# Patient Record
Sex: Female | Born: 1965
Health system: Southern US, Community
[De-identification: ages and names within clinical notes are randomized; demographics above are authoritative.]

## PROBLEM LIST (undated history)

## (undated) ENCOUNTER — Ambulatory Visit: Payer: PPO | Source: Home / Self Care

## (undated) DIAGNOSIS — R413 Other amnesia: Secondary | ICD-10-CM

## (undated) DIAGNOSIS — G2579 Other drug induced movement disorders: Secondary | ICD-10-CM

## (undated) DIAGNOSIS — K589 Irritable bowel syndrome without diarrhea: Secondary | ICD-10-CM

## (undated) DIAGNOSIS — G9081 Serotonin syndrome: Secondary | ICD-10-CM

## (undated) DIAGNOSIS — E785 Hyperlipidemia, unspecified: Secondary | ICD-10-CM

## (undated) DIAGNOSIS — M797 Fibromyalgia: Secondary | ICD-10-CM

## (undated) DIAGNOSIS — G43909 Migraine, unspecified, not intractable, without status migrainosus: Secondary | ICD-10-CM

## (undated) DIAGNOSIS — M47816 Spondylosis without myelopathy or radiculopathy, lumbar region: Secondary | ICD-10-CM

## (undated) DIAGNOSIS — J189 Pneumonia, unspecified organism: Secondary | ICD-10-CM

## (undated) DIAGNOSIS — G8929 Other chronic pain: Secondary | ICD-10-CM

## (undated) DIAGNOSIS — K219 Gastro-esophageal reflux disease without esophagitis: Secondary | ICD-10-CM

## (undated) DIAGNOSIS — M549 Dorsalgia, unspecified: Secondary | ICD-10-CM

## (undated) DIAGNOSIS — K449 Diaphragmatic hernia without obstruction or gangrene: Secondary | ICD-10-CM

## (undated) DIAGNOSIS — M542 Cervicalgia: Secondary | ICD-10-CM

## (undated) DIAGNOSIS — Z87442 Personal history of urinary calculi: Secondary | ICD-10-CM

## (undated) DIAGNOSIS — G250 Essential tremor: Secondary | ICD-10-CM

## (undated) DIAGNOSIS — IMO0002 Reserved for concepts with insufficient information to code with codable children: Secondary | ICD-10-CM

## (undated) DIAGNOSIS — R251 Tremor, unspecified: Secondary | ICD-10-CM

## (undated) DIAGNOSIS — N200 Calculus of kidney: Secondary | ICD-10-CM

## (undated) DIAGNOSIS — F32A Depression, unspecified: Secondary | ICD-10-CM

## (undated) DIAGNOSIS — F319 Bipolar disorder, unspecified: Secondary | ICD-10-CM

## (undated) HISTORY — DX: Irritable bowel syndrome, unspecified: K58.9

## (undated) HISTORY — DX: Gastro-esophageal reflux disease without esophagitis: K21.9

## (undated) HISTORY — DX: Calculus of kidney: N20.0

## (undated) HISTORY — DX: Serotonin syndrome: G90.81

## (undated) HISTORY — DX: Migraine, unspecified, not intractable, without status migrainosus: G43.909

## (undated) HISTORY — DX: Bipolar disorder, unspecified: F31.9

## (undated) HISTORY — PX: APPENDECTOMY: SHX54

## (undated) HISTORY — PX: BACK SURGERY: SHX140

## (undated) HISTORY — DX: Spondylosis without myelopathy or radiculopathy, lumbar region: M47.816

## (undated) HISTORY — DX: Other drug induced movement disorders: G25.79

## (undated) HISTORY — PX: LAPAROSCOPIC CHOLECYSTECTOMY: SUR755

## (undated) HISTORY — PX: CHOLECYSTECTOMY: SHX55

## (undated) HISTORY — PX: LASER ABLATION OF THE CERVIX: SHX1949

## (undated) HISTORY — DX: Diaphragmatic hernia without obstruction or gangrene: K44.9

## (undated) HISTORY — PX: TONSILLECTOMY: SUR1361

## (undated) HISTORY — DX: Hyperlipidemia, unspecified: E78.5

## (undated) HISTORY — DX: Essential tremor: G25.0

## (undated) HISTORY — DX: Fibromyalgia: M79.7

## (undated) HISTORY — PX: BRONCHOSCOPY: SUR163

## (undated) HISTORY — DX: Other amnesia: R41.3

## (undated) HISTORY — DX: Tremor, unspecified: R25.1

## (undated) HISTORY — DX: Pneumonia, unspecified organism: J18.9

## (undated) HISTORY — DX: Reserved for concepts with insufficient information to code with codable children: IMO0002

## (undated) SURGERY — ARTHROSCOPY, KNEE, WITH MEDIAL MENISCECTOMY
Anesthesia: Choice | Site: Knee | Laterality: Left

---
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
## 2000-05-05 ENCOUNTER — Ambulatory Visit (HOSPITAL_COMMUNITY): Admission: RE | Admit: 2000-05-05 | Discharge: 2000-05-05 | Payer: Self-pay | Admitting: Neurosurgery

## 2000-05-05 ENCOUNTER — Encounter: Payer: Self-pay | Admitting: Neurosurgery

## 2000-08-13 ENCOUNTER — Encounter: Payer: Self-pay | Admitting: Neurosurgery

## 2000-08-13 ENCOUNTER — Ambulatory Visit (HOSPITAL_COMMUNITY): Admission: RE | Admit: 2000-08-13 | Discharge: 2000-08-14 | Payer: Self-pay | Admitting: Neurosurgery

## 2000-08-13 HISTORY — PX: OTHER SURGICAL HISTORY: SHX169

## 2000-09-23 ENCOUNTER — Ambulatory Visit (HOSPITAL_COMMUNITY): Admission: RE | Admit: 2000-09-23 | Discharge: 2000-09-23 | Payer: Self-pay | Admitting: Neurosurgery

## 2000-09-23 ENCOUNTER — Encounter: Payer: Self-pay | Admitting: Neurosurgery

## 2003-01-09 ENCOUNTER — Encounter: Admission: RE | Admit: 2003-01-09 | Discharge: 2003-01-09 | Payer: Self-pay | Admitting: Orthopaedic Surgery

## 2003-10-23 ENCOUNTER — Ambulatory Visit (HOSPITAL_COMMUNITY): Admission: RE | Admit: 2003-10-23 | Discharge: 2003-10-23 | Payer: Self-pay | Admitting: Rheumatology

## 2004-06-09 HISTORY — PX: ANKLE FRACTURE SURGERY: SHX122

## 2004-07-13 ENCOUNTER — Emergency Department (HOSPITAL_COMMUNITY): Admission: EM | Admit: 2004-07-13 | Discharge: 2004-07-13 | Payer: Self-pay | Admitting: Emergency Medicine

## 2004-07-31 ENCOUNTER — Ambulatory Visit: Payer: Self-pay | Admitting: Internal Medicine

## 2004-08-06 ENCOUNTER — Ambulatory Visit: Payer: Self-pay | Admitting: Internal Medicine

## 2004-08-06 ENCOUNTER — Ambulatory Visit (HOSPITAL_COMMUNITY): Admission: RE | Admit: 2004-08-06 | Discharge: 2004-08-06 | Payer: Self-pay | Admitting: Internal Medicine

## 2004-08-06 HISTORY — PX: ESOPHAGEAL DILATION: SHX303

## 2004-09-24 ENCOUNTER — Ambulatory Visit: Payer: Self-pay | Admitting: Internal Medicine

## 2004-11-06 ENCOUNTER — Ambulatory Visit (HOSPITAL_COMMUNITY): Admission: RE | Admit: 2004-11-06 | Discharge: 2004-11-06 | Payer: Self-pay | Admitting: Internal Medicine

## 2004-11-06 ENCOUNTER — Ambulatory Visit: Payer: Self-pay | Admitting: Internal Medicine

## 2006-10-18 ENCOUNTER — Ambulatory Visit: Payer: Self-pay | Admitting: Cardiology

## 2006-11-24 ENCOUNTER — Ambulatory Visit: Payer: Self-pay | Admitting: Cardiology

## 2006-11-27 ENCOUNTER — Ambulatory Visit: Payer: Self-pay | Admitting: Pulmonary Disease

## 2006-11-27 ENCOUNTER — Inpatient Hospital Stay (HOSPITAL_COMMUNITY): Admission: AD | Admit: 2006-11-27 | Discharge: 2006-11-28 | Payer: Self-pay | Admitting: Cardiology

## 2006-11-27 ENCOUNTER — Ambulatory Visit: Payer: Self-pay | Admitting: Cardiology

## 2006-11-27 HISTORY — PX: CARDIAC CATHETERIZATION: SHX172

## 2006-11-28 ENCOUNTER — Encounter (INDEPENDENT_AMBULATORY_CARE_PROVIDER_SITE_OTHER): Payer: Self-pay | Admitting: Cardiology

## 2006-11-28 ENCOUNTER — Ambulatory Visit: Payer: Self-pay | Admitting: Internal Medicine

## 2006-12-24 ENCOUNTER — Ambulatory Visit: Payer: Self-pay | Admitting: Physician Assistant

## 2007-01-25 ENCOUNTER — Encounter: Admission: RE | Admit: 2007-01-25 | Discharge: 2007-01-25 | Payer: Self-pay | Admitting: Internal Medicine

## 2007-02-09 ENCOUNTER — Encounter: Admission: RE | Admit: 2007-02-09 | Discharge: 2007-02-09 | Payer: Self-pay | Admitting: Internal Medicine

## 2007-02-24 ENCOUNTER — Encounter: Admission: RE | Admit: 2007-02-24 | Discharge: 2007-02-24 | Payer: Self-pay | Admitting: Internal Medicine

## 2008-02-04 ENCOUNTER — Encounter: Admission: RE | Admit: 2008-02-04 | Discharge: 2008-02-04 | Payer: Self-pay | Admitting: Internal Medicine

## 2008-02-17 ENCOUNTER — Encounter: Admission: RE | Admit: 2008-02-17 | Discharge: 2008-02-17 | Payer: Self-pay | Admitting: Internal Medicine

## 2008-03-02 ENCOUNTER — Encounter: Admission: RE | Admit: 2008-03-02 | Discharge: 2008-03-02 | Payer: Self-pay | Admitting: Internal Medicine

## 2008-09-04 ENCOUNTER — Encounter: Admission: RE | Admit: 2008-09-04 | Discharge: 2008-09-04 | Payer: Self-pay | Admitting: Internal Medicine

## 2009-02-19 ENCOUNTER — Encounter: Admission: RE | Admit: 2009-02-19 | Discharge: 2009-02-19 | Payer: Self-pay | Admitting: Orthopaedic Surgery

## 2009-08-08 ENCOUNTER — Ambulatory Visit (HOSPITAL_COMMUNITY)
Admission: RE | Admit: 2009-08-08 | Discharge: 2009-08-08 | Payer: Self-pay | Source: Home / Self Care | Admitting: Allergy and Immunology

## 2010-06-29 ENCOUNTER — Emergency Department (HOSPITAL_COMMUNITY)
Admission: EM | Admit: 2010-06-29 | Discharge: 2010-06-29 | Payer: Self-pay | Source: Home / Self Care | Admitting: Emergency Medicine

## 2010-07-02 LAB — COMPREHENSIVE METABOLIC PANEL
Alkaline Phosphatase: 68 U/L (ref 39–117)
BUN: 14 mg/dL (ref 6–23)
CO2: 24 mEq/L (ref 19–32)
Chloride: 102 mEq/L (ref 96–112)
Creatinine, Ser: 0.78 mg/dL (ref 0.4–1.2)
GFR calc non Af Amer: >60 mL/min (ref >60)
Glucose, Bld: 119 mg/dL — ABNORMAL HIGH (ref 70–99)
Potassium: 3.5 mEq/L (ref 3.5–5.1)
Total Bilirubin: 0.4 mg/dL (ref 0.3–1.2)

## 2010-07-02 LAB — URINALYSIS, ROUTINE W REFLEX MICROSCOPIC
Hgb urine dipstick: NEGATIVE
Protein, ur: NEGATIVE mg/dL
Specific Gravity, Urine: 1.019 (ref 1.005–1.030)
Urine Glucose, Fasting: NEGATIVE mg/dL
Urobilinogen, UA: 0.2 mg/dL (ref 0.0–1.0)

## 2010-07-02 LAB — CBC
HCT: 41 % (ref 36.0–46.0)
Hemoglobin: 13.9 g/dL (ref 12.0–15.0)
MCH: 30.7 pg (ref 26.0–34.0)
MCV: 90.5 fL (ref 78.0–100.0)
RBC: 4.53 MIL/uL (ref 3.87–5.11)

## 2010-07-02 LAB — DIFFERENTIAL
Eosinophils Absolute: 0.2 10*3/uL (ref 0.0–0.7)
Lymphocytes Relative: 10 % — ABNORMAL LOW (ref 12–46)
Lymphs Abs: 0.8 10*3/uL (ref 0.7–4.0)
Monocytes Relative: 10 % (ref 3–12)
Neutro Abs: 5.7 10*3/uL (ref 1.7–7.7)
Neutrophils Relative %: 77 % (ref 43–77)

## 2010-07-02 LAB — RAPID URINE DRUG SCREEN, HOSP PERFORMED: Barbiturates: POSITIVE — AB

## 2010-07-02 LAB — D-DIMER, QUANTITATIVE: D-Dimer, Quant: <0.22 ug/mL-FEU (ref 0.00–0.48)

## 2010-07-02 LAB — URINE MICROSCOPIC-ADD ON

## 2010-10-22 NOTE — Discharge Summary (Signed)
NAMEWINSTON, SOBCZYK                  ACCOUNT NO.:  [id number]   MEDICAL RECORD NO.:  [id number]          PATIENT TYPE:  INP   LOCATION:  4713                         FACILITY:  [facility]   PHYSICIAN:  Bevelyn Buckles. Bensimhon, MDDATE OF BIRTH:  [birth year]   DATE OF ADMISSION:  11/27/2006  DATE OF DISCHARGE:  11/28/2006                               DISCHARGE SUMMARY   PROCEDURES:  1. 2-D echocardiogram.  2. Cardiac catheterization.  3. Left ventriculogram.  4. Coronary angiogram.   PRIMARY DIAGNOSES:  Chest pain, minimal coronary artery disease with  normal left ventricular function by catheterization;  noncardiac  etiology for pain.   SECONDARY DIAGNOSES:  1. History of asthma with chronic dyspnea.  2. Chronic headache.  3. Fibromyalgia.  4. Hypoxemia, with no evidence of pulmonary embolus or congestive      heart failure.  5. Sinus tachycardia.  6. Leukocytosis, with a history of Staphylococcus seen at bronchoscopy      and on 1 month of antibiotics.  7. History of back surgery.  8. EGD with dilatation.  9. Depression.  10.Gastroesophageal reflux disease.  11.Polyneuropathy.  12.Possible sleep apnea.  13.Irritable that bowel syndrome.  14.History of degenerative joint disease.  15.History of surgical dysplasia with laser treatment x2.  16.Status post laparoscopic cholecystectomy.  17.Status post tonsillectomy.   TIME AT DISCHARGE:  40 minutes.   HOSPITAL COURSE:  Ms. Shon Baton is a 45 year old female with no previous  history of coronary artery disease.  She was seen in May by Dr. Andee Lineman,  who felt that she had atypical chest pain and felt that an outpatient  dobutamine echocardiogram was appropriate.  This was negative for  ischemia and normal in all values.  She went to Dr. Betsey Amen office for  headache and was referred to [facility].  She complained of chest  pain and was evaluated there by cardiology.  To definitively assess her  for coronary artery disease,  She was  transferred to [facility]  for catheterization.   The cardiac catheterization showed a 20% LAD.  Her EF was 65% without  regional wall motion abnormalities and no AS or MR.  Dr. Samule Ohm  evaluated the films and felt that she had minimal coronary artery  disease with a normal left ventricle, and no cardiac explanation for her  chest pain.   The patient, her mother and sister were concerned for pulmonary  hypertension.  A 2-D echocardiogram was performed, and per Dr.  Prescott Gum report no significant pulmonary hypertension was seen.  A  pulmonary consult was called and she was seen by Dr. Craige Cotta.  Dr. Craige Cotta  felt that her dyspnea was multifactorial.  She has a history of asthma  but he did not think that this was a prominent component.  He felt that  she also had components of obesity, deconditioning and probable sleep  apnea.  He felt that she would need to have a CPX study as an  outpatient, and also she might need a repeat polysomnogram.  He felt  that further workup could be done as an outpatient.  Of  note, she has  seen a pulmonologist at [facility] and has a follow-up appointment  already scheduled with that physician.  The situation was discussed with  the patient and her mother, who stated they would decide if they would  with wish further evaluation by the physician in [facility] or with Dr.  Craige Cotta.  They will contact Dr. Craige Cotta if they wish followup with him.  She  was encouraged to keep the follow-up appointment with her physician in  [place].  Ms. Shon Baton was evaluated by Dr. Gala Romney and by Dr. Craige Cotta,  and considered stable for discharge on November 28, 2006.   DISCHARGE INSTRUCTIONS:  1. Her activity level is to be increased gradually.  2. She is to call our office for any problems with the catheterization      site.  3. She is to follow up with Dr. Andee Lineman, and a message has been left.  4. She is to follow up with her pulmonologist at [facility] in [place] and with Dr. Benson Setting and Dr. Abel Presto      as needed.  5. She is to follow up with Dr. Craige Cotta as needed.   DISCHARGE MEDICATIONS:  1. Coated aspirin 81 mg daily.  2. Cymbicort 2 puffs b.i.d.  3. Klonopin 2 mg b.i.d.  4. Wellbutrin XL 300 mg daily.  5. Zoloft 200 mg daily.  6. Singulair 10 mg daily.  7. Protonix 40 mg daily.  8. Lamictal 150 mg daily.  9. Mysoline 50 mg daily.  10.Zithromax 250 mg a day for a month.  11.Ambien as prior to admission.  12.Birth control pills as prior to admission.      Theodore Demark, PA-C      Bevelyn Buckles. Bensimhon, MD  Electronically Signed    RB/MEDQ  D:  11/28/2006  T:  11/29/2006  Job:  [id number]   cc:   Sabino Donovan [facility],  Coralyn Helling, MD  [facility], [place], [place] Female Pulmonologist (name starts w/N)

## 2010-10-22 NOTE — Cardiovascular Report (Signed)
Nancy [name], LEVERETT NO.:  [id number]   MEDICAL RECORD NO.:  [id number]          PATIENT TYPE:  INP   LOCATION:  4713                         FACILITY:  [facility]   PHYSICIAN:  Salvadore Farber, MD  DATE OF BIRTH:  [birth year]   DATE OF PROCEDURE:  11/27/2006  DATE OF DISCHARGE:                            CARDIAC CATHETERIZATION   PROCEDURE:  1. Left heart catheterization.  2. Left ventriculography.  3. Coronary angiography.   INDICATIONS:  Ms. Shon Baton is a 45 year old woman with fibromyalgia,  asthma and GERD, who was admitted to [facility] on June 17 by her  neurologist with severe headache.  During hospitalization, she has had  persistent severe chest discomforts.  She ruled out for myocardial  infarction.  Dobutamine echocardiogram showed no evidence of ischemia or  infarction.  The patient nonetheless insisted on cardiac catheterization  because of concerns with a family history of premature atherosclerotic  disease; she was therefore transferred for this procedure.  She  continues to complain of severe headache, despite treatment with  narcotics.   PROCEDURAL TECHNIQUE:  Informed consent was obtained.  Under 1%  lidocaine local anesthesia, a 5-French sheath was placed in the right  common femoral artery using the modified Seldinger technique.  Diagnostic angiography and ventriculography were performed using JL-4,  JR-4, and pigtail catheters.  The patient tolerated the procedure well  and was transferred to the holding room in stable condition.  Sheaths  will be removed there.   COMPLICATIONS:  None.   FINDINGS:  1. LV:  128/3/9.  EF 65% without regional wall motion abnormality.  2. No aortic stenosis or mitral regurgitation.  3. Left main:  Angiographically normal.  4. LAD:  Moderate-sized vessel giving rise to a single large diagonal.      The LAD has a 20% stenosis just after the origin of the diagonal.  5. Circumflex:  Fairly large vessel  giving rise to a single marginal.      It is angiographically normal.  6. RCA:  Moderate-sized dominant vessel.  It is angiographically      normal.   IMPRESSION/PLAN:  The patient has minimal atherosclerotic coronary  disease with normal left ventricular size and systolic function.  There  is no aortic stenosis or mitral regurgitation.  I see no cardiac  explanation for her chest pains.      Salvadore Farber, MD  Electronically Signed     WED/MEDQ  D:  11/27/2006  T:  11/28/2006  Job:  [id number]   cc:   Doreen Beam

## 2010-10-22 NOTE — Assessment & Plan Note (Signed)
[facility]                          [facility] OFF[facility] NOTE   SHARIAN, DELIA                         MRN:          [id number]  DATE:12/24/2006                            DOB:          [birth year]    CARDIOLOGIST:  Dr. Lewayne Bunting.   PRIMARY CARE PHYSICIAN:  Dr. Doreen Beam.   REASON FOR VISIT:  Post hospitalization followup.   HISTORY OF PRESENT ILLNESS:  Ms. Shon Baton is a 45 year old female patient  who presents to the office today for post hospitalization followup.  She  had recently been evaluated with dobutamine stress test that was  nonischemic.  However, she was admitted to [facility] with  intractable pain from headache.  She has multiple medical problems  including fibromyalgia and chronic back pain.  She was further evaluated  and it was felt that she should be worked up with cardiac  catheterization.  She was transferred to [facility] and cardiac  catheterization showed minimal nonobstructive coronary disease.  She had  a 20% lesion in the LAD and otherwise normal coronary arteries.  Her EF  was 65%.  An echocardiogram was also done that showed no evidence of  pulmonary hypertension.  She also had a chest CT done that showed no  evidence of pulmonary embolus.  Pulmonology saw the patient and felt  that she may need CPX testing as an outpatient as well as followup  polysomnogram.  The patient has a pulmonologist in [facility].  Since  discharge from the hospital she has been back to see Dr. Michaelle Copas.  She  notes that she has been diagnosed with bronchiolitis.  She is on chronic  Zithromax.  She goes back to see Dr. Michaelle Copas in several weeks.  She is  also trying to get in to see a Neurologist in [facility] secondary to  history of tremors and headaches, as well as balance problems.  She  continues to have chest discomfort and shortness of breath.  There has  been no change in her symptoms since discharge from the  hospital.  She  notes that her breathing overall has improved since being on Zithromax.  She tells me that at this point in time Dr. Michaelle Copas does not plan on  doing any followup CPX testing or repeat sleep study.  From what she  tells me it sounds like the plan is to see how she responds to continued  therapy for bronchiolitis before persuing any other testing.   CURRENT MEDICATIONS:  1. Wellbutrin XL 300 mg daily.  2. Zoloft 200 mg daily.  3. Symbicort 160/45 two puff b.i.d.  4. Verapamil 120 mg daily.  5. Aspirin 81 mg daily.  6. Singulair 10 mg daily.  7. Protonix 40 mg daily.  8. Lamictal 150 mg daily.  9. Mysoline 50 mg daily.  10.Lo Ovral.  11.Ambien.  12.Clonazepam.  13.Zithromax 250 mg daily.  14.Albuterol p.r.n.  15.Flexeril p.r.n.  16.Hydrocodone  p.r.n.  17.Phenergan p.r.n.   ALLERGIES:  CYMBALTA, EFFEXOR, KEFLEX, HYDROCODONE (?), MORPHINE (SHE  MUST HAVE PHENERGAN WITH MORPHINE), THEODUR .  PHYSICAL EXAMINATION:  She is well-nourished, well-developed female in  obvious pain.  Blood pressure 129/89, pulse 111, weight 181 pounds.  HEENT:  Normal.  CARDIAC:  Normal S1-S2, regular rate and rhythm.  LUNGS:  Clear to auscultation bilaterally.  ABDOMEN:  Soft, nontender.  EXTREMITIES:  Without edema.  Right femoral arteriotomy site without  hematoma or bruit.   Electrocardiogram reveals sinus tachycardia with a heart rate of 104, no  acute changes.   IMPRESSION:  1. Chronic chest pain -- non cardiac.  2. Chronic dyspnea -- multifactorial.  3. History of asthma.  4. Recent history of bronchiolitis.      a.     Apparent finding of Staphylococcus at bronchoscopy recently.  5. Chronic headaches.  6. Fibromyalgia.  7. Chronic back pain.      a.     Status post surgery.  8. Gastroesophageal reflux disease.      a.     History of dilatation.  9. Depression.  10.Polyneuropathy.  11.Questionable sleep apnea.  12.Irritable bowel syndrome.  13.Good left  ventricular function.  14.Minimal coronary plaquing by recent cardiac catheterization.   PLAN:  Patient presents to the office today for post catheterization  followup.  As noted above she continues to have chest pain and shortness  of breath.  Her cardiac workup was fairly normal.  At this point in time  she does not require any further cardiovascular testing.  She can follow  up in our office on a p.r.n. basis.  She has followup arranged with the  Pulmonologist in [place].  Further recommendations regarding  treatment of her dyspnea will be per Dr. Michaelle Copas.  She will continue  on an aspirin a day.  She should continue followup for risk factor  modification with Dr. Sherril Croon.  Since she does have 20% plaquing in the LAD  she would certainly  benefit from statin therapy, however, in light of her multiple medical  problems and multiple medications I will leave that up to Dr. Sherril Croon to  address.      Tereso Newcomer, PA-C  Electronically Signed      Learta Codding, MD,FACC  Electronically Signed   SW/MedQ  DD: 12/24/2006  DT: 12/24/2006  Job #: [id number]   cc:   Doreen Beam  Dr. Michaelle Copas, at [facility]  Felipe Drone, MD

## 2010-10-22 NOTE — Consult Note (Signed)
NAMEMARIBELL, DEMEO NO.:  [id number]   MEDICAL RECORD NO.:  [id number]          PATIENT TYPE:  INP   LOCATION:  4713                         FACILITY:  [facility]   PHYSICIAN:  Coralyn Helling, MD        DATE OF BIRTH:  1966-02-19   DATE OF CONSULTATION:  11/28/2006  DATE OF DISCHARGE:                                 CONSULTATION   REFERRING PHYSICIAN:  Salvadore Farber, MD   REASON FOR CONSULTATION:  Dyspnea.   HISTORY:  I met Ms. Brooks with the mother today for evaluation of her  dyspnea.  She was admitted to [facility] on June 17 with  complaints of headache.  During that admission she was noted to have  atypical chest pain.  She was evaluated by cardiology, and for further  evaluation of this she was transferred to [facility] to undergo  cardiac catheterization.  This was done on June 20 and was essentially  normal.   She also had an echocardiogram done on June 19 at [facility],  with a normal left ventricular systolic function, ejection fraction 60%  and normal right ventricular systolic function.  She had a CT scan of  her chest with contrast on June 19, which showed bibasilar atelectasis  but no evidence for pulmonary embolism or other underlying  parenchymal  disease.  She also had an arterial blood gas done on a non-rebreather  mask, which showed a pH of 7.38, pCO2 of 46 and pO2 of 191.   She says that she has had problems with dyspnea for quite some time.  She says that currently she gets short of breath with even minimal  amounts of activity.  When this happens she tries taking an inhaler,  which she says sometimes help but sometimes does not.  She has had a  history of asthma since the age of 84.  She has been evaluated at [facility], as well by a pulmonologist in [place].   She has also seen an allergist.  She has undergone allergy testing and  apparently has multiple allergies.  She denies any food allergies or  significant allergic reactions to insect stings.   She also complains of chronic pain, as well as migraines.  She also has  quite a bit of difficulty with her sleep.  She does snore at night and  her mother says that she has seen her stop breathing.  Her mother also  says that she twitches a lot while she is asleep.  Ms. Shon Baton says that  she has difficulty both falling asleep and staying asleep, and she will  sometimes wake up with a coughing sensation.  She apparently has  undergone an overnight polysomnogram at both [facility] and  [facility]; and, as per the patient, the study at [facility]  did not show evidence for sleep disordered breathing.  However, the one  at [place] did.  Unfortunately, I do not have those studies available  for review at this time.   Ms. Shon Baton also says she had recently undergone  pulmonary function tests  at [facility].  Again, I do not have those available for my review at  this time.   PAST MEDICAL HISTORY:  1. Fibromyalgia and chronic pain.  2. Chronic headaches.  3. Reflux disease.  4. Asthma.  5. Chronic back pain, with both cervical and lumbar disk disease.  6. History of serotonin syndrome.   PAST SURGICAL HISTORY:  1. Cholecystectomy.  2. She has also undergone bronchoscopy, which apparently was positive      for Staphylococcus aureus.   ALLERGIES:  Include:  THEODUR, HYDROCODONE, MOTRIN, KEFLEX, EFFEXOR,  CYMBALTA.   SOCIAL HISTORY:  She is married.  She is a former smoker.  There is no  history of significant alcohol use.  She worked as a Engineer, civil (consulting).   CURRENT MEDICATIONS:  1. Advair 250/50 one puff b.i.d.  2. Ambien 10 mg q.h.s.  3. Aspirin 325 mg daily.  4. Klonopin 2 mg t.i.d.  5. Lamictal 150 mg daily.  6. Mysoline 50 mg daily.  7. Protonix 40 mg daily.  8. Singulair 10 mg daily.  9. Wellbutrin 300 mg daily.  10.Zithromax 250 mg daily.  11.Zoloft 200 mg daily.  12.Flexeril 10 mg b.i.d. p.r.n.  13.Percocet  5/325 mg 1-6 tablets q.4 h p.r.n.   REVIEW OF SYSTEMS:  Unremarkable, except for as stated above.   PHYSICAL EXAMINATION:  VITAL SIGNS:  Blood pressure 119/77, heart rate  92, respirations 18, temperature is 98.3, oxygen saturation 93% on room  air.  HEENT:  Pupils reactive.  Extraocular muscles intact.  She has narrow  nasal angles, with ala collapse with inhalation.  She has a mild poly-4  airway, and is mildly retrognathic.  NECK:  There is no lymphadenopathy, no thyromegaly.  HEART:  S1-S2 regular rhythm.  CHEST:  There was no wheezing, but there were fine rales heard at the  bases.  ABDOMEN:  Obese, soft, nontender.  EXTREMITIES:  There was no edema, cyanosis or clubbing.  NEUROLOGIC:  She had 5/5 strength.   LABORATORY STUDIES:  WBC 11, hemoglobin 12, hematocrit 36.1, platelet  count 354.  Sodium 138, potassium 3.6, chloride 102, CO2 28, BUN 9,  creatinine 0.8, glucose 153.  Calcium 8.1.  Glycosylated hemoglobin 6.7.   IMPRESSION:  DYSPNEA.  This is likely multifactorial.  I suspect that  she does have some degree of asthma with allergic component, although I  do not think that this is her only factor contributing to her dyspnea.  This may in fact not be contributing very significantly.  She does have  some difficulty with sinuses, and  it would may be best to optimize her  treatment of this; (although she is quite reluctant to use any types of  nose sprays to optimize his sinus regimen).  She does also have symptoms  of reflux, and I will continue her on her proton pump inhibitor.  My  suspicion, however, is that the majority of her difficulties are related  to chronic pain, fibromyalgia, obesity and deconditioning.  I have  discussed this with her at length, and I have discussed with her that in  order to further evaluate this we will arrange for her to undergo a cardiopulmonary exercise test.  This will be done on an outpatient  basis.   In addition, I am quite concerned  that she likely has some degree of  sleep disordered breathing.  I would like to obtain copies of her sleep  studies from [facility] and [facility].  Then, depending  upon results  of this, she may in fact need to undergo an additional overnight  polysomnogram.  Additionally, I would like to obtain copies of her  previous pulmonary function tests, and depending upon results of this  she may need to undergo repeat pulmonary function tests.   Again, I do not think that she would need to remain in the hospital for  further evaluation of this, as these tests can be done on an outpatient  basis.  She has also requested to have further follow-up with me on an  outpatient basis, and I have given her my contact information.  I have  advised her to call my office to schedule a follow-up appointment in the  next 2-3 weeks.      Coralyn Helling, MD  Electronically Signed     VS/MEDQ  D:  11/28/2006  T:  11/29/2006  Job:  [id number]   cc:   Asa Lente, MD

## 2010-10-25 NOTE — Op Note (Signed)
NAMEDAREN, DOSWELL                  ACCOUNT NO.:  [id number]   MEDICAL RECORD NO.:  [id number]          PATIENT TYPE:  AMB   LOCATION:  DAY                           FACILITY:  [facility]   PHYSICIAN:  Lionel December, M.D.    DATE OF BIRTH:  08/27/1965   DATE OF PROCEDURE:  11/06/2004  DATE OF DISCHARGE:                                 OPERATIVE REPORT   PROCEDURE:  Placement of Bravo device for pH study without EGD.   INDICATIONS:  Nancy [name] is a 45 year old Caucasian female with multiple  medical problems who has chronic GERD, who has not responded to any therapy.  She had EGD on August 06, 2004 which revealed small sliding hiatal hernia  otherwise normal exam. Since she has failed medical therapy, pH study was  recommended prior to considering antireflux surgery.   Procedure risks were reviewed with the patient, informed consent was  obtained.   PREMEDICATION:  Cetacaine spray for oropharyngeal topical anesthesia,  Demerol 25 milligrams IV, Versed 8 milligrams IV.   FINDINGS:  The patient was placed in the left lateral position. Vital signs  and O2 sat were monitored during procedure and remained stable. Tried to  pass the device with mild sedation but she started heaving and gagging;  therefore had to be given more sedation. The second time, I was able to pass  the device loaded on with Bravo without any difficulty into esophagus. There  was advanced to 30.5 cm to the incisors. It was connected to suction which  is applied per protocol for over 30 seconds. The plunger was pushed and was  turned clockwise to disengage the loading device. It was gradually pulled  out. The patient tolerated the procedure well.   FINAL DIAGNOSIS:  Refractory GERD, fails medical therapy.   Status post placement of Bravo device for pH without EGD.   Please note that the patient has been off PPI therapy for at least 10 days.   RECOMMENDATIONS:  The patient was given all the instructions regarding  symptom diary and how to take care of the recording device.     NR/MEDQ  D:  11/06/2004  T:  11/06/2004  Job:  [id number]   cc:   Dr. Sherril Croon

## 2010-10-25 NOTE — Consult Note (Signed)
Nancy [name], Nancy [name]                  ACCOUNT NO.:  [id number]   MEDICAL RECORD NO.:  [id number]          PATIENT TYPE:  AMB   LOCATION:  DAY                           FACILITY:  [facility]   PHYSICIAN:  Lionel December, M.D.    DATE OF BIRTH:  08-07-1965   DATE OF CONSULTATION:  07/31/2004  DATE OF DISCHARGE:                                   CONSULTATION   CONSULTING PHYSICIAN:  Lionel December, M.D.   REFERRING PHYSICIAN:  Dr. Doreen Beam with [facility], [place],  [place].   REASON FOR CONSULTATION:  Epigastric pain, severe reflux, nausea.   HISTORY OF PRESENT ILLNESS:  Nancy [name] is a 45 year old Caucasian female who  presents today for further evaluation of the above stated symptoms.  Since  October 2005, she has had problems with acid reflux.  Specifically, she is  having regurgitation of food and fluid into her mouth.  She has a bitter  taste in her mouth as well as malodorous breath.  She has had a lack of  appetite.  Her food does not taste right.  She has had nausea associated  with this but no vomiting.  She has to force herself to eat.  She complains  of her stomach rolling.  She has a lot of belching.  Often when she gets up  in the morning, she belches food that she ate the night before.  Her bowels  have no particular pattern.  She may have a bowel movement several days in a  row but then may go five days without a bowel movement.  She fluctuates  between constipation and diarrhea.  She has seen some red blood on the  toilet tissue when she has had a lot of diarrhea or passes a hard stool.  Denies any melena.  Her weight is down 10-15 pounds since October 2005.  She  rarely has heartburn but when she does she takes a GI cocktail.  She has  been on Carafate for this as well.  She occasionally has dysphagia to solid  food.  She takes Phenergan p.r.n. which seems to help her nausea some.  She  has been tried on Nexium 80 mg daily.  Currently she is only on 40 mg  daily.  Only new medication is Topamax which began in December 2005.   CURRENT MEDICATIONS:  1.  Zoloft 150 mg every day.  2.  Wellbutrin XL 150 mg every day.  3.  Nexium 40 mg every day.  4.  Advair 250/50 mg every day.  5.  Oxazepam 30 mg every day.  6.  Clonazepam 0.5 mg t.i.d.  7.  L-lysine 500 mg every day.  8.  Birth control pill, oral, every day.  9.  Vitamin B-12 1,000 mcg injection every month.  10. Topamax 150 mg every day.  11. Prenatal complex every day.  12. Calcium with magnesium every day.  13. Relpax 40 mg p.r.n.  14. Flexeril 10 mg p.r.n.  15. Albuterol p.r.n.  16. Aleve p.r.n.  17. Ipratropium bromide daily.   ALLERGIES:  1.  CYMBALTA  and  2.  EFFEXOR causes serotonin syndrome.  3.  HYDROCODONE and  4.  MORPHINE cause nausea, vomiting, and itching.  5.  KEFLEX causes rash and joint swelling.  6.  THEO-DUR causes palpitations.   PAST MEDICAL HISTORY:  1.  Depression, followed by psychiatrist.  2.  Gastroesophageal reflux disease.  3.  Fibromyalgia.  4.  Polyneuropathy.  5.  Migraine headaches.  6.  Sleep apnea.  7.  Asthma.  8.  IBS.  9.  Degenerative disk disease.  10. Recently fractured right ankle, two weeks ago and is in an immobilizer.  11. History of tachy arrhythmia, previously on Cardizem but this was      discontinued at some point.   PAST SURGICAL HISTORY:  1.  She had diskectomy of L5-S1.  2.  She has had cervical dysplasia status post laser treatment in 1989 and      then again in 1995.  3.  She has had a laparoscopic cholecystectomy.  4.  Tonsillectomy.   FAMILY HISTORY:  Mother has diverticulosis, diabetes.  No family history of  colorectal cancer.   SOCIAL HISTORY:  She is married and has one child.  She is on disability.  She quit smoking in 1998.  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  Denies any alcohol use.   REVIEW OF SYSTEMS:  See HPI for GI.  CARDIOPULMONARY:  Complains of  palpitations  which she has had chronically.  Denies any shortness of breath.  GENITOURINARY:  Has regular menses.  No dysuria, hematuria.   PHYSICAL EXAMINATION:  VITAL SIGNS:  Weight 139, temp 97.7, blood pressure  114/80, pulse 120 and regular.  GENERAL:  A pleasant, well nourished, well developed, Caucasian female in no  acute distress.  She is accompanied by her mother.  SKIN:  Warm and dry.  No jaundice.  HEENT:  Pupils are equal, round and reactive to light.  Conjunctivae are  pink.  Sclerae are nonicteric.  Oropharyngeal mucosa moist and pink.  No  lesions, erythema, or exudate.  No lymphadenopathy, thyromegaly.  CHEST:  Lungs are clear to auscultation.  CARDIAC:  Reveals a regular rhythm.  Tachycardia, rate of 120.  No murmurs,  rubs or gallops.  ABDOMEN:  Positive bowel sounds.  Soft, nondistended.  She has mild  tenderness throughout the abdomen to deep palpation.  No rebound tenderness  or guarding.  No organomegaly or masses.  EXTREMITIES:  Right lower extremity with immobilizer.   IMPRESSION:  Nancy [name] is a 45 year old Nancy [name] with multiple gastrointestinal  symptoms including chronic gastroesophageal reflux disease with only  intermittent breakthrough heartburn symptoms.  She does have frequent  regurgitation of food and fluid, anorexia, nausea, bad taste in her mouth,  and malodorous breath.  Her food does not taste normal.  She does have  some intermittent dysphagia to solid foods as well.  Symptoms could be  multifactorial in etiology.  She is on multiple medications which could be  impeding gastric emptying as well as relaxing __________  pressure.  This  would make her more prone to regurgitation as well as gastroparesis type  symptoms.  In addition, she has had some weight loss as well as alternating  constipation and diarrhea.  Bowel symptoms and abdominal discomfort most likely related to irritable bowel syndrome, although most likely related to  irritable bowel syndrome.  She  reports prior unremarkable small-bowel follow-  through in the remote past.  She has had occasional small  volume  hematochezia, possibly due to benign anorectal source.   PLAN:  1.  EGD and +/- esophageal dilatation in the near future.  If this is      negative, may consider gastric emptying study as the next step in      evaluation.  2.  __________  two tablets daily.  3.  Levbid, take a half to one tablet p.o. every day to b.i.d. p.r.n., #30,      with one refill.  4.  If she has any further rectal bleeding, may consider colonoscopy at some      point in the near future.      LL/MEDQ  D:  07/31/2004  T:  07/31/2004  Job:  [id number]   cc:   Doreen Beam  [address]  [place]  [place] [address]  Fax: [phone]

## 2010-10-25 NOTE — Op Note (Signed)
[place]  Patient:    Nancy [name], Nancy [name] [facility]                     MRN: [id number] Proc. Date: 08/13/00 Adm. Date:  [id number] Attending:  Gerald Dexter                           Operative Report  PREOPERATIVE DIAGNOSIS:  Herniated disk, L5-S1 left.  POSTOPERATIVE DIAGNOSIS:  Herniated disk, L5-S1 left.  OPERATION PERFORMED:  Left L5-S1 interlaminar laminotomy for excision of herniated disk with the operating microscope.  SECONDARY PROCEDURE:  Microdissection L5-S1 disk and S1 nerve roots.  SURGEON:  Reinaldo Meeker, M.D.  ASSISTANT:  Donalee Citrin, Montez Hageman.  ANESTHESIA:  General.  DESCRIPTION OF PROCEDURE:  After being placed in the prone position, the patients back was prepped and draped in the usual sterile fashion. A localizing x-ray was taken prior to incision to identify the L5-S1 level.  A midline incision was made about the spinous processes of L5 and S1.  Using the Bovie cutting current, the incision was carried down to the spinous processes. Subperiosteal dissection was then carried out along the left side of the spinous process and lamina and the McCullough self-retaining retractor was placed for exposure.  A second x-ray was taken to confirm approach to the L5-S1 level and this was correct.  Using a high-speed drill the inferior one third of the L5 lamina and the medial third of the facet joint were removed. It was then used to remove the superior one-third of the S1 lamina.  Residual bone and ligamentum flavum were removed.  Microscope was then draped and brought into the field and used for the remainder of the case.  Using microdissection technique, the lateral aspect of the thecal sac and S1 nerve were identified.  Further coagulation was carried out down to the floor of the canal to identify the L5-S1 disk, which was found to be markedly herniated, particularly centrally.  After coagulating on the annulus, the annulus was incised  with a 15 blade.  Using pituitary rongeurs and curets, a very thorough disk space clean-out was carried out.  At the same time, great care was taken to avoid injury to the neural elements and this was successfully done.  When all the disk material had been removed that could be removed, inspection was carried out in all directions for any evidence of residual compression and none could be identified.  Large amounts of irrigation were carried out and any bleeding controlled with bipolar coagulation and Gelfoam. The wound was then closed using interrupted Vicryl on the muscle, fascia, subcutaneous and subcuticular tissues and staples on the skin.  Sterile dressing was then applied.  The patient was extubated and taken to the recovery room in stable condition. DD:  08/13/00 TD:  08/14/00 Job: [id number]

## 2010-10-25 NOTE — Op Note (Signed)
NAMEKENZEE, [name]                  ACCOUNT NO.:  [id number]   MEDICAL RECORD NO.:  [id number]          PATIENT TYPE:  AMB   LOCATION:  DAY                           FACILITY:  [facility]   PHYSICIAN:  Lionel December, M.D.    DATE OF BIRTH:  Nov 07, 1965   DATE OF PROCEDURE:  08/06/2004  DATE OF DISCHARGE:                                 OPERATIVE REPORT   PROCEDURE:  Esophagogastroduodenoscopy with esophageal dilation.   INDICATION:  Nancy [name] is a 45 year old Caucasian female with multiple medical  problems who has chronic GERD. She remains with recurrent regurgitation,  occasional heartburn, and she also has dysphagia, epigastric pain, and  nausea. She is undergoing diagnostic/therapeutic procedure. Procedure risks  were reviewed the patient, and informed consent was obtained.   PREMEDICATION:  Cetacaine spray for pharyngeal topical anesthesia, Demerol  50 mg IV, Versed 18 mg IV in divided dose.   FINDINGS:  Procedure performed in endoscopy suite. The patient's vital signs  and O2 saturation were monitored during procedure and remained stable. The  patient was placed left lateral position. Olympus video scope was passed via  oropharynx without any difficulty into esophagus.   Esophagus:  Mucosa of the esophagus was normal throughout. GE junction was  at 36 cm and hiatus was at 38. No ring or stricture was noted.   Stomach:  It was empty and distended very well insufflation. Folds of  proximal stomach were normal. Examination of mucosa revealed linear streaks  of erythema at antrum, but no erosions or ulcers were noted. Pyloric channel  was patent. Angularis, fundus and cardia were examined flexible scope were  normal.   Duodenum:  Bulbar mucosa was normal. Scope was passed to the second part of  duodenum where mucosa and folds normal. Endoscope was withdrawn.   Esophagus was dilated by passing 56-French Maloney dilator to full  insertion. As the dilator was withdrawn, endoscope was  passed again and his  esophageal mucosa reexamined. There was no mucosal disruption. Stomach was  decompressed, and endoscope was withdrawn. The patient tolerated the  procedure well.   FINAL DIAGNOSES:  1.  Small sliding hiatal hernia without evidence of ring, stricture or      erosive esophagitis. Esophagus dilated by passing 56-French Maloney      dilator given history of solid food dysphagia, but no mucosal disruption      was induced.  2.  Nonerosive antral gastritis. Normal examination of bulb and postbulbar      duodenum.   RECOMMENDATIONS:  1.  She will continue anti-reflux measures and Nexium at 40 mg p.o. q.a.m.  2.  H pylori serology will be checked today.  3.  Trial with Levbid half a tablet p.o. q.a.m.. This would help with her      IVB and also if she is having antral/pyloric spasm. I will be contacting      the patient with results of blood test.      NR/MEDQ  D:  08/06/2004  T:  08/06/2004  Job:  [id number]   cc:   Herminio Commons Vyas  [address]  [address]  [place]  [place] [address]  Fax: [phone]

## 2011-03-03 ENCOUNTER — Encounter: Payer: Self-pay | Admitting: Pulmonary Disease

## 2011-03-04 ENCOUNTER — Encounter: Payer: Self-pay | Admitting: Pulmonary Disease

## 2011-03-04 ENCOUNTER — Ambulatory Visit (INDEPENDENT_AMBULATORY_CARE_PROVIDER_SITE_OTHER): Payer: Medicare Other | Admitting: Pulmonary Disease

## 2011-03-04 DIAGNOSIS — J45909 Unspecified asthma, uncomplicated: Secondary | ICD-10-CM

## 2011-03-04 NOTE — Patient Instructions (Signed)
Will get medical records from Dr. Willa Rough and Dr's in [place]>>will call with results Follow up in one month

## 2011-03-04 NOTE — Progress Notes (Signed)
Subjective:    Patient ID: Nancy [name], female    DOB: 1965-11-02, 45 y.o.   MRN: [id number]  HPI 45 yo female with asthma.    I had previously seen Ms. Shon Baton while she was in hospital in 2008.  She has been followed most recently by pulmonary and allergy at [facility] and allergy in [place].    She was diagnosed with asthma at age 87.  She had pneumonia in 1986, and her symptoms got worse.  She has noticed more rapid deterioration of her symptoms over the past 3 years.  She had bronchoscopy 5 years ago, and was told she had Staphylococcus aureus in her lungs.  She was on Xolair from 2009 through 2011, and felt this helped.  This apparently was stopped due to insurance issues.  She has frequent exacerbations, needing therapy with antibiotics and prednisone.  She has been in the emergency room and hospital multiple times for her asthma.  She has trouble with her breathing most of the time.  Her symptoms can occur day and night.  She was able to walk about 2 miles on a regular basis.  She can now only walk about 1/2 mild before getting winded.  She needs to use her rescue inhalers several times per day.  These help, but don't last long enough.  She usually gets chest tightness, and occasional wheezing from her chest.  She had previous allergy tests, and was told she has allergies to everything.  She has not been on allergy shots before.    She does not get much cough, and denies sputum production.  She has not had recent fever, rash, gland swelling or leg swelling.  While she never smoked cigarettes herself, she did have second hand tobacco exposure.  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  She is now disabled.  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  She denies recent travel or sick exposures.  She has been using singulair for years.  She was using advair, but switched to symbicort.  She had spiriva added recently.  This combination seems to work better.    She  does have reflux, but this is controlled with omeprazole.  She does not have much trouble with her sinuses at present.  She has not been using anti-histamines.  She had her pneumonia shot about 5 years ago.  Past Medical History  Diagnosis Date  . Migraine headache   . GERD (gastroesophageal reflux disease)   . Serotonin syndrome   . Dyslipidemia   . Degenerative disk disease   . Asthma   . Bipolar 1 disorder   . Fibromyalgia   . Hiatal hernia   . Irritable bowel syndrome (IBS)   . Benign familial tremor   . Nephrolithiasis   . Allergic rhinitis     Family History  Problem Relation Age of Onset  . Allergies Mother   . Allergies Brother   . Allergies Father   . Heart disease Father   . Clotting disorder Mother     Social History  . Marital Status: Legally Separated   Occupational History  . disabled    Social History Main Topics  . Smoking status: Former Smoker -- 0.5 packs/day for 8 years    Types: Cigarettes    Quit date: 06/09/1978  . Alcohol Use: No  . Drug Use: No   Allergies  Allergen Reactions  . Cephalosporins   . Cymbalta (Duloxetine Hcl)   .  Depakote   . Effexor (Venlafaxine Hydrochloride)   . Geodon (Ziprasidone Hydrochloride)   . Hydrocodone   . Keflex   . Prozac (Fluoxetine Hcl)   . Theo-Dur (Theophylline)   . Thorazine (Chlorpromazine Hcl)   . Topamax     Outpatient Prescriptions Prior to Visit  Medication Sig Dispense Refill  . albuterol (PROVENTIL HFA;VENTOLIN HFA) 108 (90 BASE) MCG/ACT inhaler Inhale 2 puffs into the lungs every 6 (six) hours as needed.        [name] albuterol (PROVENTIL) (2.5 MG/3ML) 0.083% nebulizer solution Take 2.5 mg by nebulization every 6 (six) hours as needed.        [name] amitriptyline (ELAVIL) 25 MG tablet Take 25 mg by mouth at bedtime.        . budesonide-formoterol (SYMBICORT) 160-4.5 MCG/ACT inhaler Inhale 2 puffs into the lungs 2 (two) times daily.        . clonazePAM (KLONOPIN) 2 MG tablet Take 2 mg by mouth daily.         [name] ezetimibe-simvastatin (VYTORIN) 10-20 MG per tablet Take 1 tablet by mouth at bedtime.        . fenofibrate 160 MG tablet Take 160 mg by mouth daily.        [name] lamoTRIgine (LAMICTAL) 200 MG tablet Take 200 mg by mouth 2 (two) times daily.        . montelukast (SINGULAIR) 10 MG tablet Take 10 mg by mouth at bedtime.        . primidone (MYSOLINE) 50 MG tablet Take 50 mg by mouth at bedtime.        [name] tiotropium (SPIRIVA) 18 MCG inhalation capsule Place 18 mcg into inhaler and inhale daily.        . carbamazepine (TEGRETOL XR) 400 MG 12 hr tablet Take 400 mg by mouth daily.        . Flaxseed, Linseed, 1000 MG CAPS Take 1 capsule by mouth 2 (two) times daily.        [name] ipratropium (ATROVENT) 0.02 % nebulizer solution Take 500 mcg by nebulization 4 (four) times daily.         Review of Systems  Constitutional: Positive for appetite change and unexpected weight change.  Respiratory: Positive for cough and shortness of breath.   Musculoskeletal: Positive for joint swelling.  Neurological: Positive for headaches.  Psychiatric/Behavioral: Positive for dysphoric mood. The patient is nervous/anxious.       Objective:   Physical Exam  BP 102/68  Pulse 94  Temp(Src) 98.2 F (36.8 C) (Oral)  Wt 166 lb 3.2 oz (75.388 kg)  SpO2 97%  General - healthy HEENT - no sinus tenderness, clear nasal discharge, no oral exudate, PERRLA, EOMI, no LAN, no thyromegaly Cardiac - s1s2 regular, no murmur, pulses symmetric Chest - normal respiratory excursion, no wheeze/rales/dullness Abd - soft, mild RLQ tenderness, normal bowel sounds Ext - no e/c/c Neuro - normal strength, CN intact Skin - no rashes Psych - normal mood/behavior  CHEST - 2 VIEW 06/29/10: Comparison: 08/08/2009  Findings: The heart size and mediastinal contours are within normal limits. Both lungs are clear. The visualized skeletal structures are unremarkable.  IMPRESSION:  No active cardiopulmonary disease.      Assessment & Plan:     Severe Persistent Asthma She has extensive history of difficult to control asthma with prior evidence for atopy.  She has frequent exacerbation requiring antibiotics and prednisone therapy.  She reports prior beneficial response to Xolair.  She has undergone extensive previous evaluation with  pulmonary and allergy at [facility] and allergy in [place].  Will get copies of medical records, and then determine what additional testing needs to be done.  She seems reasonably stable on her current regimen of symbicort, spiriva, and singulair.  Advised that she could also try using OTC anti-histamines as needed.  I will call her once I have been able to review her medical records, and discuss if additional interventions are needed at this time.    Updated Medication List Outpatient Encounter Prescriptions as of 03/04/2011  Medication Sig Dispense Refill  . albuterol (PROVENTIL HFA;VENTOLIN HFA) 108 (90 BASE) MCG/ACT inhaler Inhale 2 puffs into the lungs every 6 (six) hours as needed.        [name] albuterol (PROVENTIL) (2.5 MG/3ML) 0.083% nebulizer solution Take 2.5 mg by nebulization every 6 (six) hours as needed.        [name] amitriptyline (ELAVIL) 25 MG tablet Take 25 mg by mouth at bedtime.        . budesonide-formoterol (SYMBICORT) 160-4.5 MCG/ACT inhaler Inhale 2 puffs into the lungs 2 (two) times daily.        . Carbamazepine, Antipsychotic, (EQUETRO) 100 MG CP12 Take 300 mg by mouth daily.        . clonazePAM (KLONOPIN) 2 MG tablet Take 2 mg by mouth daily.        [name] ezetimibe-simvastatin (VYTORIN) 10-20 MG per tablet Take 1 tablet by mouth at bedtime.        . fenofibrate 160 MG tablet Take 160 mg by mouth daily.        [name] lamoTRIgine (LAMICTAL) 200 MG tablet Take 200 mg by mouth 2 (two) times daily.        . montelukast (SINGULAIR) 10 MG tablet Take 10 mg by mouth at bedtime.        [name] omeprazole (PRILOSEC OTC) 20 MG tablet Take 20 mg by mouth daily.        . primidone (MYSOLINE) 50 MG tablet Take 50 mg by  mouth at bedtime.        [name] tiotropium (SPIRIVA) 18 MCG inhalation capsule Place 18 mcg into inhaler and inhale daily.        [name] DISCONTD: carbamazepine (TEGRETOL XR) 400 MG 12 hr tablet Take 400 mg by mouth daily.        [name] DISCONTD: Flaxseed, Linseed, 1000 MG CAPS Take 1 capsule by mouth 2 (two) times daily.        [name] DISCONTD: ipratropium (ATROVENT) 0.02 % nebulizer solution Take 500 mcg by nebulization 4 (four) times daily.

## 2011-03-05 ENCOUNTER — Telehealth: Payer: Self-pay | Admitting: Pulmonary Disease

## 2011-03-05 NOTE — Telephone Encounter (Signed)
Pt aware we have received her fax and is placed in VS look at

## 2011-03-12 ENCOUNTER — Encounter: Payer: Self-pay | Admitting: Pulmonary Disease

## 2011-03-12 DIAGNOSIS — J4550 Severe persistent asthma, uncomplicated: Secondary | ICD-10-CM | POA: Insufficient documentation

## 2011-03-12 NOTE — Assessment & Plan Note (Signed)
She has extensive history of difficult to control asthma with prior evidence for atopy.  She has frequent exacerbation requiring antibiotics and prednisone therapy.  She reports prior beneficial response to Xolair.  She has undergone extensive previous evaluation with pulmonary and allergy at [facility] and allergy in [place].  Will get copies of medical records, and then determine what additional testing needs to be done.  She seems reasonably stable on her current regimen of symbicort, spiriva, and singulair.  Advised that she could also try using OTC anti-histamines as needed.  I will call her once I have been able to review her medical records, and discuss if additional interventions are needed at this time.

## 2011-03-26 LAB — BASIC METABOLIC PANEL
BUN: 9
Creatinine, Ser: 0.8
GFR calc non Af Amer: >60
Glucose, Bld: 153 — ABNORMAL HIGH

## 2011-03-26 LAB — CBC
HCT: 36.1
MCV: 84.3
Platelets: 354
RDW: 15.5 — ABNORMAL HIGH
WBC: 11 — ABNORMAL HIGH

## 2011-03-26 LAB — HEMOGLOBIN A1C: Hgb A1c MFr Bld: 6.7 — ABNORMAL HIGH

## 2011-04-01 ENCOUNTER — Ambulatory Visit (INDEPENDENT_AMBULATORY_CARE_PROVIDER_SITE_OTHER): Payer: Medicare Other | Admitting: Pulmonary Disease

## 2011-04-01 ENCOUNTER — Other Ambulatory Visit: Payer: Medicare Other

## 2011-04-01 ENCOUNTER — Encounter: Payer: Self-pay | Admitting: Pulmonary Disease

## 2011-04-01 VITALS — BP 100/62 | HR 76 | Temp 98.5°F | Ht 64.0 in | Wt 165.6 lb

## 2011-04-01 DIAGNOSIS — J4550 Severe persistent asthma, uncomplicated: Secondary | ICD-10-CM

## 2011-04-01 DIAGNOSIS — J45909 Unspecified asthma, uncomplicated: Secondary | ICD-10-CM

## 2011-04-01 NOTE — Patient Instructions (Signed)
Lab test today Will schedule breathing test (PFT) Follow up in 2 months

## 2011-04-01 NOTE — Assessment & Plan Note (Signed)
She has persistent symptoms in spite of aggressive inhaler regimen.  She is reluctant to use prednisone unless absolutely necessary.  Will check her RAST, IgE, and PFT to assess whether she would be a candidate to restart xolair.

## 2011-04-01 NOTE — Progress Notes (Signed)
Addended by: Tommie Sams on: 04/01/2011 02:04 PM   Modules accepted: Orders

## 2011-04-01 NOTE — Progress Notes (Signed)
Chief Complaint  Patient presents with  . Follow-up    Pt states she has bene having some increase SOB x friday, dry cough, chest tightness, occasional chest congestion. Pt states she feels like sometimes she gets a "mucus plug" in her chest and has to cough and cough to get it up    History of Present Illness:  45 yo female with severe asthma.  She has noticed more chest tightness and cough over the past several weeks.  October is usually a bad month for her.  She is not having much sputum or wheeze.  She denies fever, and her sinuses are okay.  She is using her proventil 4 to 6 times per day.  Past Medical History  Diagnosis Date  . Migraine headache   . GERD (gastroesophageal reflux disease)   . Serotonin syndrome   . Dyslipidemia   . Degenerative disk disease   . Asthma   . Bipolar 1 disorder   . Fibromyalgia   . Hiatal hernia   . Irritable bowel syndrome (IBS)   . Benign familial tremor   . Nephrolithiasis   . Allergic rhinitis   . Pneumonia 1986    Past Surgical History  Procedure Date  . Laparoscopic cholecystectomy   . Microdissection l5-s1 August 13, 2000  . Appendectomy   . Tonsillectomy   . Back fusion   . Ankle fracture surgery 2006    Right  . Esophageal dilation August 06, 2004  . Cardiac catheterization November 27, 2006    minimal CAD  . Bronchoscopy     S. aureus from BAL  . Laser ablation of the cervix     Current Outpatient Prescriptions on File Prior to Visit  Medication Sig Dispense Refill  . albuterol (PROVENTIL HFA;VENTOLIN HFA) 108 (90 BASE) MCG/ACT inhaler Inhale 2 puffs into the lungs every 6 (six) hours as needed.        [name] albuterol (PROVENTIL) (2.5 MG/3ML) 0.083% nebulizer solution Take 2.5 mg by nebulization every 6 (six) hours as needed.        [name] amitriptyline (ELAVIL) 25 MG tablet Take 25 mg by mouth at bedtime.        . budesonide-formoterol (SYMBICORT) 160-4.5 MCG/ACT inhaler Inhale 2 puffs into the lungs 2 (two) times daily.        .  Carbamazepine, Antipsychotic, (EQUETRO) 100 MG CP12 Take 300 mg by mouth daily.        . clonazePAM (KLONOPIN) 2 MG tablet Take 2 mg by mouth daily.        [name] ezetimibe-simvastatin (VYTORIN) 10-20 MG per tablet Take 1 tablet by mouth at bedtime.        . fenofibrate 160 MG tablet Take 160 mg by mouth daily.        [name] lamoTRIgine (LAMICTAL) 200 MG tablet Take 200 mg by mouth 2 (two) times daily.        . montelukast (SINGULAIR) 10 MG tablet Take 10 mg by mouth at bedtime.        [name] omeprazole (PRILOSEC OTC) 20 MG tablet Take 20 mg by mouth daily.        . primidone (MYSOLINE) 50 MG tablet Take 50 mg by mouth at bedtime.        [name] tiotropium (SPIRIVA) 18 MCG inhalation capsule Place 18 mcg into inhaler and inhale daily.          Allergies  Allergen Reactions  . Cephalosporins   . Cymbalta (Duloxetine Hcl)   . Depakote   .  Effexor (Venlafaxine Hydrochloride)   . Geodon (Ziprasidone Hydrochloride)   . Hydrocodone   . Keflex   . Prozac (Fluoxetine Hcl)   . Theo-Dur (Theophylline)   . Thorazine (Chlorpromazine Hcl)   . Topamax     Physical Exam:  Blood pressure 100/62, pulse 76, temperature 98.5 F (36.9 C), temperature source Oral, height 5\' 4"  (1.626 m), weight 165 lb 9.6 oz (75.116 kg), SpO2 97.00%.  General - healthy  HEENT - no sinus tenderness, clear nasal discharge, no oral exudate, PERRLA, EOMI, no LAN, no thyromegaly  Cardiac - s1s2 regular, no murmur, pulses symmetric  Chest - normal respiratory excursion, no wheeze/rales/dullness  Abd - soft, mild RLQ tenderness, normal bowel sounds  Ext - no e/c/c  Neuro - normal strength, CN intact  Skin - no rashes  Psych - normal mood/behavior  Assessment/Plan:  Severe persistent asthma She has persistent symptoms in spite of aggressive inhaler regimen.  She is reluctant to use prednisone unless absolutely necessary.  Will check her RAST, IgE, and PFT to assess whether she would be a candidate to restart xolair.      Outpatient  Encounter Prescriptions as of 04/01/2011  Medication Sig Dispense Refill  . albuterol (PROVENTIL HFA;VENTOLIN HFA) 108 (90 BASE) MCG/ACT inhaler Inhale 2 puffs into the lungs every 6 (six) hours as needed.        [name] albuterol (PROVENTIL) (2.5 MG/3ML) 0.083% nebulizer solution Take 2.5 mg by nebulization every 6 (six) hours as needed.        [name] amitriptyline (ELAVIL) 25 MG tablet Take 25 mg by mouth at bedtime.        . budesonide-formoterol (SYMBICORT) 160-4.5 MCG/ACT inhaler Inhale 2 puffs into the lungs 2 (two) times daily.        . Carbamazepine, Antipsychotic, (EQUETRO) 100 MG CP12 Take 300 mg by mouth daily.        . clonazePAM (KLONOPIN) 2 MG tablet Take 2 mg by mouth daily.        [name] ezetimibe-simvastatin (VYTORIN) 10-20 MG per tablet Take 1 tablet by mouth at bedtime.        . fenofibrate 160 MG tablet Take 160 mg by mouth daily.        [name] lamoTRIgine (LAMICTAL) 200 MG tablet Take 200 mg by mouth 2 (two) times daily.        . metoprolol succinate (TOPROL-XL) 25 MG 24 hr tablet Take 25 mg by mouth daily.        . montelukast (SINGULAIR) 10 MG tablet Take 10 mg by mouth at bedtime.        [name] omeprazole (PRILOSEC OTC) 20 MG tablet Take 20 mg by mouth daily.        . primidone (MYSOLINE) 50 MG tablet Take 50 mg by mouth at bedtime.        [name] tiotropium (SPIRIVA) 18 MCG inhalation capsule Place 18 mcg into inhaler and inhale daily.          Tevita Gomer 04/01/2011, 1:50 PM

## 2011-04-02 LAB — ALLERGY FULL PROFILE
Allergen, D pternoyssinus,d7: 11.2 kU/L — ABNORMAL HIGH (ref <0.35)
Allergen,Goose feathers, e70: <0.1 kU/L (ref <0.35)
Aspergillus fumigatus, IgG: <0.1 kU/L (ref <0.35)
Bahia Grass: <0.1 kU/L (ref <0.35)
Box Elder IgE: <0.1 kU/L (ref <0.35)
Common Ragweed: 0.2 kU/L (ref <0.35)
D. farinae: 8.01 kU/L — ABNORMAL HIGH (ref <0.35)
Dog Dander: 3.56 kU/L — ABNORMAL HIGH (ref <0.35)
G005 Rye, Perennial: <0.1 kU/L (ref <0.35)
Helminthosporium halodes: <0.1 kU/L (ref <0.35)
House Dust Hollister: 14.8 kU/L — ABNORMAL HIGH (ref <0.35)
IgE (Immunoglobulin E), Serum: 162.1 IU/mL (ref 0.0–180.0)
Oak: <0.1 kU/L (ref <0.35)
Plantain: <0.1 kU/L (ref <0.35)
Stemphylium Botryosum: <0.1 kU/L (ref <0.35)
Sycamore Tree: <0.1 kU/L (ref <0.35)
Timothy Grass: <0.1 kU/L (ref <0.35)

## 2011-04-04 ENCOUNTER — Telehealth: Payer: Self-pay | Admitting: Pulmonary Disease

## 2011-04-04 DIAGNOSIS — J4550 Severe persistent asthma, uncomplicated: Secondary | ICD-10-CM

## 2011-04-04 NOTE — Telephone Encounter (Signed)
Unable to reach patient.  Left message for her to call with time and phone number she can be reached at.

## 2011-04-04 NOTE — Telephone Encounter (Signed)
Per VS's most recent phone note-will forward to VS to call patient.:  SOOD,VINEET, MD 04/04/2011 10:30 AM Signed  RAST 04/01/11>>Cat dander, dog dander, dust mites  IgE 04/01/11>>162.1  Left message for pt to return call to discuss results. Specifically will need to discuss pt's pet cat.

## 2011-04-04 NOTE — Telephone Encounter (Signed)
RAST 04/01/11>>Cat dander, dog dander, dust mites IgE 04/01/11>>162.1  Left message for pt to return call to discuss results.  Specifically will need to discuss pt's pet cat.

## 2011-04-07 ENCOUNTER — Telehealth: Payer: Self-pay | Admitting: Pulmonary Disease

## 2011-04-07 DIAGNOSIS — J4550 Severe persistent asthma, uncomplicated: Secondary | ICD-10-CM

## 2011-04-07 NOTE — Telephone Encounter (Signed)
RAST 04/01/11>>Cat dander, dog dander, dust mites  IgE 04/01/11>>162.1   Results d/w pt over the phone.  She is very attached to her pet cat, and views her pet as part of her family.  She would not be willing at this time to have the cat removed.  She continues to have difficulty with her breathing in spite of aggressive inhaler therapy and singulair.  She has frequent exacerbations requiring prednisone therapy.  She has documented allergens and elevated IgE.  Will see if she can restart xolair.  Main barrier at this time will be related to her financial situation.

## 2011-04-07 NOTE — Telephone Encounter (Signed)
I spoke with pt and she states the best # to contact her is [phone]. Pt states you can call her anytime bc she keeps this phone on her 24/7. Please advise Dr. Craige Cotta, thanks  Carver Fila, CMA

## 2011-04-17 ENCOUNTER — Telehealth: Payer: Self-pay | Admitting: Pulmonary Disease

## 2011-04-17 NOTE — Telephone Encounter (Signed)
Called and spoke with pt. She states she has been approved through company to get xolair injections.  Pt is faxing over the paperwork from the company so that we can send of the appropriate information (records, prescription, etc) to get pt set up on xolair.    Paperwork received and given to [place].

## 2011-04-23 NOTE — Telephone Encounter (Signed)
Pt called & asked to speak w/ Rhonda.  Pt stated she wanted to know if Bjorn Loser was back yet.  I explained that she was not here & that I was not sure when she would return.  Pt requests that we check on the paperwork for her Xolair injections & to give her a status update.  Pt can be reached at [phone].  Antionette Fairy

## 2011-04-23 NOTE — Telephone Encounter (Signed)
I spoke with patient she is aware that Nancy [name] has approval for Xolair which I will handle in the morning-pt is requesting to know if she can get her Xolair shots at Dr Willa Rough in [place] as she has previously. [phone]); pt lives close to [place], [place]. I explained that VS and Willa Rough would have to agree on this; Pt then asked about if any cost for Xolair or injections to her. I explained I would check into this and let her know asap.

## 2011-04-29 NOTE — Telephone Encounter (Signed)
Spoke with pt and notified of recs per VS. She verbalized understanding. States that she is going to contact Dr Willa Rough and find out if they will help her with administering xolair. She is going to call us back today and let us know.

## 2011-04-29 NOTE — Telephone Encounter (Signed)
PT CALLED BACK. SAYS DR HICKS WILL HAVE TO SUBMIT A CLAIM AND THEN WILL LET PT KNOW IF HE CAN ADMIN. Nancy [name]. ALSO PT WANTS TO KNOW IF CHANGING FROM VS TO DR HICKS WILL AFFECT THE WELLNESS PROGRAM ASSISTANCE SHE IS IN NOW. [phone]. Nancy [name]

## 2011-04-29 NOTE — Telephone Encounter (Signed)
LMTCB

## 2011-04-29 NOTE — Telephone Encounter (Signed)
This will need to be approved by Dr. Willa Rough.  She would likely need to re-establish care with Dr. Willa Rough also.  If she decides to do this, then she does not need to follow up with me.  She will need to discuss with Dr. Willa Rough office whether they would be agreeable to administer xolair.

## 2011-04-29 NOTE — Telephone Encounter (Signed)
Pt will have to pay for injection cost but medication cost is being assisted by the [facility] plan. I will need VS to give okay for patient to get Xolair at Dr Willa Rough office plus Dr Willa Rough will have to approve to give patient Xolair. VS please advise on this. Thanks.

## 2011-04-30 NOTE — Telephone Encounter (Signed)
lmtcb

## 2011-05-02 NOTE — Telephone Encounter (Signed)
Pt advised that it should not effect program. Nancy [name], CMA

## 2011-05-02 NOTE — Telephone Encounter (Signed)
[email or web address].Nancy [name], CMA  

## 2011-05-02 NOTE — Telephone Encounter (Signed)
Pt returned [name] call & can be reached at  [phone].  Nancy [name]

## 2011-05-02 NOTE — Telephone Encounter (Signed)
lmomtcb  

## 2011-05-08 ENCOUNTER — Other Ambulatory Visit: Payer: Self-pay | Admitting: *Deleted

## 2011-05-08 MED ORDER — EPINEPHRINE 0.3 MG/0.3ML IJ DEVI: INTRAMUSCULAR | Status: AC

## 2011-05-28 ENCOUNTER — Encounter: Payer: Self-pay | Admitting: Pulmonary Disease

## 2011-05-28 ENCOUNTER — Ambulatory Visit (INDEPENDENT_AMBULATORY_CARE_PROVIDER_SITE_OTHER): Payer: Medicare Other | Admitting: Pulmonary Disease

## 2011-05-28 ENCOUNTER — Ambulatory Visit (INDEPENDENT_AMBULATORY_CARE_PROVIDER_SITE_OTHER): Payer: Medicare Other

## 2011-05-28 VITALS — BP 122/78 | HR 108 | Temp 97.4°F | Ht 64.0 in | Wt 157.0 lb

## 2011-05-28 DIAGNOSIS — J45909 Unspecified asthma, uncomplicated: Secondary | ICD-10-CM

## 2011-05-28 DIAGNOSIS — J4550 Severe persistent asthma, uncomplicated: Secondary | ICD-10-CM

## 2011-05-28 LAB — PULMONARY FUNCTION TEST

## 2011-05-28 MED ORDER — ALBUTEROL SULFATE HFA 108 (90 BASE) MCG/ACT IN AERS: 2.0000 | INHALATION_SPRAY | Freq: Four times a day (QID) | RESPIRATORY_TRACT | Status: DC | PRN

## 2011-05-28 MED ORDER — OMALIZUMAB 150 MG ~~LOC~~ SOLR
300.0000 mg | Freq: Once | SUBCUTANEOUS | Status: AC
  Administered 2011-05-28: 300 mg via SUBCUTANEOUS

## 2011-05-28 NOTE — Progress Notes (Signed)
Chief Complaint  Patient presents with  . Follow-up    Patient presents to discuss PFT results. Also, needs refill for Proair inhaler    History of Present Illness: Nancy [name] is a 45 y.o. female severe asthma.  She got her first xolair shot today.  Her breathing has been doing better.  She does not have much cough or congestion.  She is not having as much wheeze.  She still uses her albuterol several times per week, but not as much as before.  She is not having much trouble with her sinuses or her throat.  She denies fever, or chest pain.  Past Medical History  Diagnosis Date  . Migraine headache   . GERD (gastroesophageal reflux disease)   . Serotonin syndrome   . Dyslipidemia   . Degenerative disk disease   . Asthma   . Bipolar 1 disorder   . Fibromyalgia   . Hiatal hernia   . Irritable bowel syndrome (IBS)   . Benign familial tremor   . Nephrolithiasis   . Allergic rhinitis   . Pneumonia 1986    Past Surgical History  Procedure Date  . Laparoscopic cholecystectomy   . Microdissection l5-s1 August 13, 2000  . Appendectomy   . Tonsillectomy   . Back fusion   . Ankle fracture surgery 2006    Right  . Esophageal dilation August 06, 2004  . Cardiac catheterization November 27, 2006    minimal CAD  . Bronchoscopy     S. aureus from BAL  . Laser ablation of the cervix     Allergies  Allergen Reactions  . Cephalosporins   . Cymbalta (Duloxetine Hcl)   . Depakote   . Effexor (Venlafaxine Hydrochloride)   . Geodon (Ziprasidone Hydrochloride)   . Hydrocodone   . Keflex   . Prozac (Fluoxetine Hcl)   . Theo-Dur (Theophylline)   . Thorazine (Chlorpromazine Hcl)   . Topamax     Physical Exam:  Blood pressure 122/78, pulse 108, temperature 97.4 F (36.3 C), temperature source Oral, height 5\' 4"  (1.626 m), weight 157 lb (71.215 kg), last menstrual period 05/05/2011, SpO2 99.00%. Body mass index is 26.95 kg/(m^2). Wt Readings from Last 2 Encounters:  05/28/11 157  lb (71.215 kg)  04/01/11 165 lb 9.6 oz (75.116 kg)    General - healthy  HEENT - no sinus tenderness, clear nasal discharge, no oral exudate, PERRLA, EOMI, no LAN, no thyromegaly  Cardiac - s1s2 regular, no murmur, pulses symmetric  Chest - normal respiratory excursion, no wheeze/rales/dullness  Abd - soft, mild RLQ tenderness, normal bowel sounds  Ext - no e/c/c  Neuro - normal strength, CN intact  Skin - no rashes  Psych - normal mood/behavior  RAST 04/01/11>>Cat dander, dog dander, dust mites  IgE 04/01/11>>162.1  PFT 05/28/11>>FEV1 3.01(112%), FEV1% 78, TLC 5.64(112%), RV 2.28(135%), DLCO 9%, positive bronchodilator response.  Assessment/Plan:  Outpatient Encounter Prescriptions as of 05/28/2011  Medication Sig Dispense Refill  . albuterol (PROVENTIL HFA;VENTOLIN HFA) 108 (90 BASE) MCG/ACT inhaler Inhale 2 puffs into the lungs every 6 (six) hours as needed.        [name] albuterol (PROVENTIL) (2.5 MG/3ML) 0.083% nebulizer solution Take 2.5 mg by nebulization every 6 (six) hours as needed.        [name] amitriptyline (ELAVIL) 25 MG tablet Take 25 mg by mouth at bedtime.        . budesonide-formoterol (SYMBICORT) 160-4.5 MCG/ACT inhaler Inhale 2 puffs into the lungs 2 (two) times daily.        [name]  Carbamazepine, Antipsychotic, (EQUETRO) 100 MG CP12 Take 300 mg by mouth daily.        . clonazePAM (KLONOPIN) 2 MG tablet Take 2 mg by mouth daily.        [name] EPINEPHrine (EPI-PEN) 0.3 mg/0.3 mL DEVI As directed as needed for severe allergic reaction  1 Device  0  . lamoTRIgine (LAMICTAL) 200 MG tablet Take 200 mg by mouth 2 (two) times daily.        . montelukast (SINGULAIR) 10 MG tablet Take 10 mg by mouth at bedtime.       [name] omeprazole (PRILOSEC OTC) 20 MG tablet Take 20 mg by mouth daily.        . primidone (MYSOLINE) 50 MG tablet Take 50 mg by mouth at bedtime.        [name] tiotropium (SPIRIVA) 18 MCG inhalation capsule Place 18 mcg into inhaler and inhale daily.        [name] DISCONTD:  ezetimibe-simvastatin (VYTORIN) 10-20 MG per tablet Take 1 tablet by mouth at bedtime.        [name] DISCONTD: fenofibrate 160 MG tablet Take 160 mg by mouth daily.        [name] DISCONTD: metoprolol succinate (TOPROL-XL) 25 MG 24 hr tablet Take 25 mg by mouth daily.         Facility-Administered Encounter Medications as of 05/28/2011  Medication Dose Route Frequency Provider Last Rate Last Dose  . omalizumab Geoffry Paradise) injection 300 mg  300 mg Subcutaneous Once Coralyn Helling, MD   300 mg at 05/28/11 1541    Tanyla Stege Pager:  [phone] 05/28/2011, 4:38 PM

## 2011-05-28 NOTE — Patient Instructions (Signed)
Follow up in 3 months

## 2011-05-28 NOTE — Assessment & Plan Note (Signed)
She has restarted xolair today.  Will continue her current inhaler regimen and montelukast, and re-assess at next visit after she has been on xolair for several months.

## 2011-05-28 NOTE — Progress Notes (Signed)
PFT done today. 

## 2011-05-29 ENCOUNTER — Encounter: Payer: Self-pay | Admitting: Pulmonary Disease

## 2011-06-12 ENCOUNTER — Telehealth: Payer: Self-pay | Admitting: Pulmonary Disease

## 2011-06-12 NOTE — Telephone Encounter (Signed)
Called and spoke with pt.  Pt states she got some paperwork in the mail regarding her Xolair and is very confused.  These forms are [employer] Reimbursement regarding her Xolair  Pt is unsure what to do.  Bjorn Loser, do you know about these forms and what pt should do?  Thanks!

## 2011-06-12 NOTE — Telephone Encounter (Signed)
Returned patient's call and no answer. LMOAM for pt to return my call in regards to the forms she received from [employer].

## 2011-06-12 NOTE — Telephone Encounter (Signed)
Spoke with patient and she received notification from [facility] that she has been approved for 2012 and 2013. The paperwork from [employer] was just letting the patient know of the amount of the grants and also she received a [employer] card. I advised the patient to keep the card in her wallet and that the paperwork from [employer] is for her records. We have already been notified from [employer] of the approval. Pt voiced understanding.

## 2011-06-24 ENCOUNTER — Ambulatory Visit (INDEPENDENT_AMBULATORY_CARE_PROVIDER_SITE_OTHER): Payer: Medicare Other

## 2011-06-24 DIAGNOSIS — J45909 Unspecified asthma, uncomplicated: Secondary | ICD-10-CM

## 2011-06-25 DIAGNOSIS — J45909 Unspecified asthma, uncomplicated: Secondary | ICD-10-CM

## 2011-06-25 MED ORDER — OMALIZUMAB 150 MG ~~LOC~~ SOLR
300.0000 mg | Freq: Once | SUBCUTANEOUS | Status: AC
  Administered 2011-06-25: 300 mg via SUBCUTANEOUS

## 2011-07-17 ENCOUNTER — Ambulatory Visit (INDEPENDENT_AMBULATORY_CARE_PROVIDER_SITE_OTHER): Payer: Medicare Other | Admitting: Otolaryngology

## 2011-07-17 DIAGNOSIS — H8110 Benign paroxysmal vertigo, unspecified ear: Secondary | ICD-10-CM

## 2011-07-17 DIAGNOSIS — R42 Dizziness and giddiness: Secondary | ICD-10-CM

## 2011-07-17 DIAGNOSIS — H93299 Other abnormal auditory perceptions, unspecified ear: Secondary | ICD-10-CM

## 2011-07-22 ENCOUNTER — Ambulatory Visit (INDEPENDENT_AMBULATORY_CARE_PROVIDER_SITE_OTHER): Payer: Medicare Other

## 2011-07-22 ENCOUNTER — Telehealth: Payer: Self-pay | Admitting: *Deleted

## 2011-07-22 DIAGNOSIS — J45909 Unspecified asthma, uncomplicated: Secondary | ICD-10-CM

## 2011-07-22 NOTE — Telephone Encounter (Signed)
I spoke with pt and is aware of VS recs. She voiced her understanding and had no further questions

## 2011-07-22 NOTE — Telephone Encounter (Signed)
Dr. Craige Cotta, Please advise regarding xolair and side effects.  Thanks!

## 2011-07-22 NOTE — Telephone Encounter (Signed)
Pt.was in the hospital from Jan.31-Feb.4. Her liver enzymes were high and her BP was low. Could xolair have anything to do with this? Please advise. [name] called ahead said she was on her way for her xolair shot;so I mixed it. Then she tells me me all this after I've given her her xolair shot. She's Dr.Sood's pt.. Please call pt.'s mobile.

## 2011-07-22 NOTE — Telephone Encounter (Signed)
Please inform patient that it would be unusual for xolair to cause problems with liver enzymes, or low blood pressure.  Please advise her that it is safe to continue using xolair.

## 2011-07-23 DIAGNOSIS — J45909 Unspecified asthma, uncomplicated: Secondary | ICD-10-CM

## 2011-07-23 MED ORDER — OMALIZUMAB 150 MG ~~LOC~~ SOLR
300.0000 mg | Freq: Once | SUBCUTANEOUS | Status: AC
  Administered 2011-07-23: 300 mg via SUBCUTANEOUS

## 2011-08-21 ENCOUNTER — Ambulatory Visit (INDEPENDENT_AMBULATORY_CARE_PROVIDER_SITE_OTHER): Payer: Medicare Other

## 2011-08-21 DIAGNOSIS — J45909 Unspecified asthma, uncomplicated: Secondary | ICD-10-CM

## 2011-08-21 MED ORDER — OMALIZUMAB 150 MG ~~LOC~~ SOLR
300.0000 mg | Freq: Once | SUBCUTANEOUS | Status: AC
  Administered 2011-08-21: 300 mg via SUBCUTANEOUS

## 2011-08-25 ENCOUNTER — Encounter: Payer: Self-pay | Admitting: Pulmonary Disease

## 2011-08-25 ENCOUNTER — Ambulatory Visit (INDEPENDENT_AMBULATORY_CARE_PROVIDER_SITE_OTHER): Payer: Medicare Other | Admitting: Pulmonary Disease

## 2011-08-25 VITALS — BP 100/72 | HR 97 | Temp 97.8°F | Ht 64.0 in | Wt 157.0 lb

## 2011-08-25 DIAGNOSIS — J45909 Unspecified asthma, uncomplicated: Secondary | ICD-10-CM

## 2011-08-25 DIAGNOSIS — J4550 Severe persistent asthma, uncomplicated: Secondary | ICD-10-CM

## 2011-08-25 NOTE — Patient Instructions (Signed)
Follow up in 4 months 

## 2011-08-25 NOTE — Progress Notes (Signed)
Chief Complaint  Patient presents with  . Asthma    Pt c/o cough with yellow, thick mucus.Marland KitchenMarland KitchenPt says Dr. Sherril Croon has treated her for bronchitis 3 times since 07/23/11.  She has been on Zpak, Levaquin, Azithromycin 500, Prednisone20mg  x 5 days  and 2 Pred Paks.    History of Present Illness: Nancy [name] is a 46 y.o. female asthma.  She was doing better until about one month ago.  She developed a bronchitis.  She was treated with several rounds of antibiotics and prednisone with her last dose yesterday.  She has improved.  She was getting cough and had occasional sputum that was thick and yellow.  She denies fever, sinus congestion, sore throat, or wheeze.  She has not had sick exposures.  She has not needed to use her albuterol much.   She feels she was doing better after starting xolair.  Past Medical History  Diagnosis Date  . Migraine headache   . GERD (gastroesophageal reflux disease)   . Serotonin syndrome   . Dyslipidemia   . Degenerative disk disease   . Asthma   . Bipolar 1 disorder   . Fibromyalgia   . Hiatal hernia   . Irritable bowel syndrome (IBS)   . Benign familial tremor   . Nephrolithiasis   . Allergic rhinitis   . Pneumonia 1986    Past Surgical History  Procedure Date  . Laparoscopic cholecystectomy   . Microdissection l5-s1 August 13, 2000  . Appendectomy   . Tonsillectomy   . Back fusion   . Ankle fracture surgery 2006    Right  . Esophageal dilation August 06, 2004  . Cardiac catheterization November 27, 2006    minimal CAD  . Bronchoscopy     S. aureus from BAL  . Laser ablation of the cervix     Allergies  Allergen Reactions  . Cephalosporins   . Cymbalta (Duloxetine Hcl)   . Depakote   . Effexor (Venlafaxine Hydrochloride)   . Geodon (Ziprasidone Hydrochloride)   . Hydrocodone   . Keflex   . Prozac (Fluoxetine Hcl)   . Theo-Dur (Theophylline)   . Thorazine (Chlorpromazine Hcl)   . Topamax     Physical Exam:  Blood pressure 100/72,  pulse 97, temperature 97.8 F (36.6 C), temperature source Oral, height 5\' 4"  (1.626 m), weight 157 lb (71.215 kg), SpO2 95.00%. Body mass index is 26.95 kg/(m^2). Wt Readings from Last 2 Encounters:  08/25/11 157 lb (71.215 kg)  05/28/11 157 lb (71.215 kg)    General - healthy  HEENT - no sinus tenderness, no nasal discharge, TM clear, no oral exudate, no LAN Cardiac - s1s2 regular, no murmur, pulses symmetric  Chest - normal respiratory excursion, no wheeze/rales/dullness  Abd - soft, normal bowel sounds  Ext - no e/c/c  Neuro - normal strength, CN intact  Skin - no rashes  Psych - normal mood/behavior    Assessment/Plan:  Outpatient Encounter Prescriptions as of 08/25/2011  Medication Sig Dispense Refill  . albuterol (PROVENTIL HFA;VENTOLIN HFA) 108 (90 BASE) MCG/ACT inhaler Inhale 2 puffs into the lungs every 6 (six) hours as needed.  1 Inhaler  5  . albuterol (PROVENTIL) (2.5 MG/3ML) 0.083% nebulizer solution Take 2.5 mg by nebulization every 6 (six) hours as needed.        [name] amitriptyline (ELAVIL) 25 MG tablet Take 25 mg by mouth at bedtime.        . budesonide-formoterol (SYMBICORT) 160-4.5 MCG/ACT inhaler Inhale 2 puffs into the  lungs 2 (two) times daily.        . Carbamazepine, Antipsychotic, (EQUETRO) 100 MG CP12 Take 300 mg by mouth daily.        . clonazePAM (KLONOPIN) 2 MG tablet Take 2 mg by mouth daily.        [name] EPINEPHrine (EPI-PEN) 0.3 mg/0.3 mL DEVI As directed as needed for severe allergic reaction  1 Device  0  . lamoTRIgine (LAMICTAL) 200 MG tablet Take 200 mg by mouth 2 (two) times daily.        . montelukast (SINGULAIR) 10 MG tablet Take 10 mg by mouth at bedtime.       [name] omeprazole (PRILOSEC OTC) 20 MG tablet Take 20 mg by mouth daily.        . primidone (MYSOLINE) 50 MG tablet Take 50 mg by mouth at bedtime.        [name] tiotropium (SPIRIVA) 18 MCG inhalation capsule Place 18 mcg into inhaler and inhale daily.          Reyan Helle Pager:   [phone] 08/25/2011, 4:43 PM

## 2011-08-25 NOTE — Assessment & Plan Note (Signed)
She was doing better after starting xolair.  She then developed persistent bronchitis about one month ago, but has improved after multiple rounds of antibiotics and prednisone.  I don't think she needs additional antibiotics or prednisone at this time.  She is to continue her current regimen.  I have advised her that she could try stopping spiriva in a few weeks after she fully recovers from her bronchitis.  She is to continue xolair injections.

## 2011-08-27 ENCOUNTER — Telehealth: Payer: Self-pay | Admitting: Pulmonary Disease

## 2011-08-27 NOTE — Telephone Encounter (Signed)
Noted  

## 2011-09-11 ENCOUNTER — Telehealth: Payer: Self-pay

## 2011-09-11 NOTE — Telephone Encounter (Signed)
Called the number provided and spoke with Rep named Magda Paganini. She states nothing on file for this pt. I will close encounter.

## 2011-09-23 ENCOUNTER — Ambulatory Visit (INDEPENDENT_AMBULATORY_CARE_PROVIDER_SITE_OTHER): Payer: Medicare Other

## 2011-09-23 DIAGNOSIS — J45909 Unspecified asthma, uncomplicated: Secondary | ICD-10-CM

## 2011-09-23 MED ORDER — OMALIZUMAB 150 MG ~~LOC~~ SOLR
300.0000 mg | Freq: Once | SUBCUTANEOUS | Status: AC
  Administered 2011-09-23: 300 mg via SUBCUTANEOUS

## 2011-10-21 ENCOUNTER — Ambulatory Visit (INDEPENDENT_AMBULATORY_CARE_PROVIDER_SITE_OTHER): Payer: Medicare Other

## 2011-10-21 ENCOUNTER — Telehealth: Payer: Self-pay | Admitting: *Deleted

## 2011-10-21 DIAGNOSIS — J45909 Unspecified asthma, uncomplicated: Secondary | ICD-10-CM

## 2011-10-21 MED ORDER — OMALIZUMAB 150 MG ~~LOC~~ SOLR
300.0000 mg | Freq: Once | SUBCUTANEOUS | Status: AC
  Administered 2011-10-21: 300 mg via SUBCUTANEOUS

## 2011-10-21 NOTE — Telephone Encounter (Signed)
Nancy [name] came in for her xolair shot today. She wants to let Dr. Craige Cotta know she went off spiriva which he suggested she could try;she has to use her rescue inhaler 4 or 5 times a day. Please advise.

## 2011-10-21 NOTE — Telephone Encounter (Signed)
Pt returned call. Nancy [name]  

## 2011-10-21 NOTE — Telephone Encounter (Signed)
lmomtcb x1 for pt 

## 2011-10-21 NOTE — Telephone Encounter (Signed)
I spoke with pt and she states she has stopped the spiriva x April 1 and every since she has notice a lot more chest tx. She is not really SOB much. She is having to use her rescue inhaler at least 3-4 times a day. She is requesting recs from VS. Pt is fine with a call back tomorrow when he returns to the office. Please advise Dr. Craige Cotta, thanks

## 2011-10-22 NOTE — Telephone Encounter (Signed)
Please advise her to resume spiriva one puff daily.  She is to call back if her symptoms persist after resuming spiriva.

## 2011-10-22 NOTE — Telephone Encounter (Signed)
lmomtcb x1 for pt 

## 2011-10-23 NOTE — Telephone Encounter (Signed)
Spoke with pt and notified of recs per VS. She verbalized understanding and states no questions, and nothing further needed.

## 2011-11-18 ENCOUNTER — Ambulatory Visit (INDEPENDENT_AMBULATORY_CARE_PROVIDER_SITE_OTHER): Payer: Medicare Other

## 2011-11-18 DIAGNOSIS — J45909 Unspecified asthma, uncomplicated: Secondary | ICD-10-CM

## 2011-11-18 MED ORDER — OMALIZUMAB 150 MG ~~LOC~~ SOLR
300.0000 mg | Freq: Once | SUBCUTANEOUS | Status: AC
  Administered 2011-11-18: 300 mg via SUBCUTANEOUS

## 2011-12-10 ENCOUNTER — Other Ambulatory Visit: Payer: Self-pay | Admitting: Pulmonary Disease

## 2011-12-10 ENCOUNTER — Telehealth: Payer: Self-pay | Admitting: Pulmonary Disease

## 2011-12-10 MED ORDER — TIOTROPIUM BROMIDE MONOHYDRATE 18 MCG IN CAPS: 18.0000 ug | ORAL_CAPSULE | Freq: Every day | RESPIRATORY_TRACT | Status: DC

## 2011-12-10 NOTE — Telephone Encounter (Signed)
VS has refilled this rx for pt in the past. RX has been sent to the pharmacy and pt is aware

## 2011-12-16 ENCOUNTER — Ambulatory Visit (INDEPENDENT_AMBULATORY_CARE_PROVIDER_SITE_OTHER): Payer: Medicare Other

## 2011-12-16 DIAGNOSIS — J45909 Unspecified asthma, uncomplicated: Secondary | ICD-10-CM

## 2011-12-16 MED ORDER — OMALIZUMAB 150 MG ~~LOC~~ SOLR
300.0000 mg | Freq: Once | SUBCUTANEOUS | Status: AC
  Administered 2011-12-16: 300 mg via SUBCUTANEOUS

## 2011-12-29 ENCOUNTER — Ambulatory Visit (INDEPENDENT_AMBULATORY_CARE_PROVIDER_SITE_OTHER): Payer: Medicare Other | Admitting: Pulmonary Disease

## 2011-12-29 ENCOUNTER — Encounter: Payer: Self-pay | Admitting: Pulmonary Disease

## 2011-12-29 VITALS — BP 110/72 | HR 79 | Temp 98.2°F | Ht 64.0 in | Wt 163.4 lb

## 2011-12-29 DIAGNOSIS — J4550 Severe persistent asthma, uncomplicated: Secondary | ICD-10-CM

## 2011-12-29 DIAGNOSIS — J45909 Unspecified asthma, uncomplicated: Secondary | ICD-10-CM

## 2011-12-29 MED ORDER — FEXOFENADINE HCL 180 MG PO TABS: 180.0000 mg | ORAL_TABLET | Freq: Every day | ORAL | Status: DC | PRN

## 2011-12-29 NOTE — Assessment & Plan Note (Signed)
Overall, improving since restarting Xolair in December 2012.  Will continue current regimen.  Explained that she could try using allegra as needed on her bad days to see if this helps.  Will re-assess her inhaler needs at her next visit.

## 2011-12-29 NOTE — Progress Notes (Signed)
Chief Complaint  Patient presents with  . Follow-up    Pt states she has to use her rescue inhaler sometimes up to 2-3 times a day. c/o chest tx, sob. denies any cough, wheezing. She occasionally feels like her heart racing (one day it was in the 120's). Still getting her xolair once a month    History of Present Illness: Nancy [name] is a 46 y.o. female asthma.  She injured her left ankle.  Overall her breathing is doing better since starting xolair.  She has good days and bad days.  She is having more good days.  She feels she can keep up with her activity without trouble from her breathing now.  She does not have as much cough or wheeze.  She still gets chest tightness, but this is better after she uses her inhaler.   Past Medical History  Diagnosis Date  . Migraine headache   . GERD (gastroesophageal reflux disease)   . Serotonin syndrome   . Dyslipidemia   . Degenerative disk disease   . Asthma   . Bipolar 1 disorder   . Fibromyalgia   . Hiatal hernia   . Irritable bowel syndrome (IBS)   . Benign familial tremor   . Nephrolithiasis   . Allergic rhinitis   . Pneumonia 1986    Past Surgical History  Procedure Date  . Laparoscopic cholecystectomy   . Microdissection l5-s1 August 13, 2000  . Appendectomy   . Tonsillectomy   . Back fusion   . Ankle fracture surgery 2006    Right  . Esophageal dilation August 06, 2004  . Cardiac catheterization November 27, 2006    minimal CAD  . Bronchoscopy     S. aureus from BAL  . Laser ablation of the cervix     Allergies  Allergen Reactions  . Cephalexin   . Cephalosporins   . Cymbalta (Duloxetine Hcl)   . Divalproex Sodium   . Effexor (Venlafaxine Hydrochloride)   . Geodon (Ziprasidone Hydrochloride)   . Hydrocodone   . Prozac (Fluoxetine Hcl)   . Theo-Dur (Theophylline)   . Thorazine (Chlorpromazine Hcl)   . Topamax     Physical Exam:  Blood pressure 110/72, pulse 79, temperature 98.2 F (36.8 C), temperature  source Oral, height 5\' 4"  (1.626 m), weight 163 lb 6.4 oz (74.118 kg), SpO2 96.00%. Body mass index is 28.05 kg/(m^2). Wt Readings from Last 2 Encounters:  12/29/11 163 lb 6.4 oz (74.118 kg)  08/25/11 157 lb (71.215 kg)    General - healthy  HEENT - no sinus tenderness, no nasal discharge, TM clear, no oral exudate, no LAN Cardiac - s1s2 regular, no murmur, pulses symmetric  Chest - normal respiratory excursion, no wheeze/rales/dullness  Abd - soft, normal bowel sounds  Ext - no e/c/c  Neuro - normal strength, CN intact  Skin - no rashes  Psych - normal mood/behavior    Assessment/Plan:  Outpatient Encounter Prescriptions as of 12/29/2011  Medication Sig Dispense Refill  . albuterol (PROVENTIL HFA;VENTOLIN HFA) 108 (90 BASE) MCG/ACT inhaler Inhale 2 puffs into the lungs every 6 (six) hours as needed.  1 Inhaler  5  . albuterol (PROVENTIL) (2.5 MG/3ML) 0.083% nebulizer solution Take 2.5 mg by nebulization every 6 (six) hours as needed.        [name] amitriptyline (ELAVIL) 25 MG tablet Take 25 mg by mouth at bedtime.        . budesonide-formoterol (SYMBICORT) 160-4.5 MCG/ACT inhaler Inhale 2 puffs into the lungs  2 (two) times daily.        . Carbamazepine, Antipsychotic, (EQUETRO) 100 MG CP12 Take 500 mg by mouth daily.       . clonazePAM (KLONOPIN) 2 MG tablet Take 2 mg by mouth daily.        [name] EPINEPHrine (EPI-PEN) 0.3 mg/0.3 mL DEVI As directed as needed for severe allergic reaction  1 Device  0  . lamoTRIgine (LAMICTAL) 200 MG tablet Take 200 mg by mouth 2 (two) times daily.        . montelukast (SINGULAIR) 10 MG tablet Take 10 mg by mouth at bedtime.       . primidone (MYSOLINE) 50 MG tablet Take 50 mg by mouth at bedtime.        [name] tiotropium (SPIRIVA HANDIHALER) 18 MCG inhalation capsule Place 1 capsule (18 mcg total) into inhaler and inhale daily.  30 capsule  5  . fexofenadine (ALLEGRA) 180 MG tablet Take 1 tablet (180 mg total) by mouth daily as needed (Allergies).      .  DISCONTD: omeprazole (PRILOSEC OTC) 20 MG tablet Take 20 mg by mouth daily.          Korri Ask Pager:  [phone] 12/29/2011, 2:25 PM

## 2011-12-29 NOTE — Patient Instructions (Signed)
Can try using allegra as needed for allergy symptoms Follow up in 4 months

## 2012-01-13 ENCOUNTER — Ambulatory Visit (INDEPENDENT_AMBULATORY_CARE_PROVIDER_SITE_OTHER): Payer: Medicare Other

## 2012-01-13 DIAGNOSIS — J45909 Unspecified asthma, uncomplicated: Secondary | ICD-10-CM

## 2012-01-13 MED ORDER — OMALIZUMAB 150 MG ~~LOC~~ SOLR
300.0000 mg | Freq: Once | SUBCUTANEOUS | Status: AC
  Administered 2012-01-13: 300 mg via SUBCUTANEOUS

## 2012-02-10 ENCOUNTER — Ambulatory Visit (INDEPENDENT_AMBULATORY_CARE_PROVIDER_SITE_OTHER): Payer: Medicare Other

## 2012-02-10 DIAGNOSIS — J45909 Unspecified asthma, uncomplicated: Secondary | ICD-10-CM

## 2012-02-10 MED ORDER — OMALIZUMAB 150 MG ~~LOC~~ SOLR
300.0000 mg | Freq: Once | SUBCUTANEOUS | Status: AC
  Administered 2012-02-10: 300 mg via SUBCUTANEOUS

## 2012-03-09 ENCOUNTER — Ambulatory Visit (INDEPENDENT_AMBULATORY_CARE_PROVIDER_SITE_OTHER): Payer: Medicare Other

## 2012-03-09 DIAGNOSIS — J45909 Unspecified asthma, uncomplicated: Secondary | ICD-10-CM

## 2012-03-10 MED ORDER — OMALIZUMAB 150 MG ~~LOC~~ SOLR
300.0000 mg | Freq: Once | SUBCUTANEOUS | Status: AC
  Administered 2012-03-10: 300 mg via SUBCUTANEOUS

## 2012-04-06 ENCOUNTER — Ambulatory Visit (INDEPENDENT_AMBULATORY_CARE_PROVIDER_SITE_OTHER): Payer: Medicare Other

## 2012-04-06 DIAGNOSIS — J45909 Unspecified asthma, uncomplicated: Secondary | ICD-10-CM

## 2012-04-07 MED ORDER — OMALIZUMAB 150 MG ~~LOC~~ SOLR
300.0000 mg | Freq: Once | SUBCUTANEOUS | Status: AC
  Administered 2012-04-07: 300 mg via SUBCUTANEOUS

## 2012-04-26 ENCOUNTER — Telehealth: Payer: Self-pay | Admitting: Pulmonary Disease

## 2012-04-26 MED ORDER — DOXYCYCLINE HYCLATE 100 MG PO TABS: 100.0000 mg | ORAL_TABLET | Freq: Two times a day (BID) | ORAL | Status: DC

## 2012-04-26 NOTE — Telephone Encounter (Signed)
Please call in prescription for doxycycline 100 mg bid, dispense 14 with no refills.  Advised to continue with prednisone as prescribed by PCP.  If she feels she can not wait for ROV until 11/20, then please work her in earlier as an acute visit with another provider in the office.

## 2012-04-26 NOTE — Telephone Encounter (Signed)
Spoke with pt and notified of recs per VS She verbalized understanding Rx was sent to pharm

## 2012-04-26 NOTE — Telephone Encounter (Signed)
Pt states that she saw her PCP on 04/12/12 and was given 5 days on azithromycin and told to continue Prednisone taper she was already taking for orthopedic issue. Things did not improve and on 04/17/12 her PCP increased her Prednisone to 60 mg x 1 days, 40 mg x 1 day then 20 mg daily thereafter. She states she still has occasional chest tightness, mostly dry cough and DOE. Pt is scheduled to see VS on Wed., 04/28/12 but would like recs from VS in the meantime. Pls advise. Allergies  Allergen Reactions  . Cephalexin   . Cephalosporins   . Cymbalta (Duloxetine Hcl)   . Divalproex Sodium   . Effexor (Venlafaxine Hydrochloride)   . Geodon (Ziprasidone Hydrochloride)   . Hydrocodone   . Prozac (Fluoxetine Hcl)   . Theo-Dur (Theophylline)   . Thorazine (Chlorpromazine Hcl)   . Topamax

## 2012-04-26 NOTE — Telephone Encounter (Signed)
LMTCB

## 2012-04-28 ENCOUNTER — Encounter: Payer: Self-pay | Admitting: Pulmonary Disease

## 2012-04-28 ENCOUNTER — Ambulatory Visit (INDEPENDENT_AMBULATORY_CARE_PROVIDER_SITE_OTHER): Payer: Medicare Other | Admitting: Pulmonary Disease

## 2012-04-28 VITALS — BP 118/68 | HR 111 | Temp 98.0°F | Ht 64.0 in | Wt 164.0 lb

## 2012-04-28 DIAGNOSIS — J45909 Unspecified asthma, uncomplicated: Secondary | ICD-10-CM

## 2012-04-28 DIAGNOSIS — J4550 Severe persistent asthma, uncomplicated: Secondary | ICD-10-CM

## 2012-04-28 MED ORDER — PREDNISONE 5 MG PO TABS: ORAL_TABLET | ORAL | Status: AC

## 2012-04-28 NOTE — Assessment & Plan Note (Signed)
She has slow to resolve exacerbation.  Will give her extended course of prednisone, and have her complete course of doxycycline.  She is to continue xolair and remainder of her inhaler regimen with singulair.

## 2012-04-28 NOTE — Progress Notes (Signed)
Chief Complaint  Patient presents with  . Follow-up    c/o increase SOB, chest tx, dry cough, hoarseness x last thursday-just finished prednisone and still taking ABX. does not feel any better since taking ABX. she is using her rescue inhaler every 4 hrs as ordered by her PCP PA. still getting xolair every 4 weeks. pt went to beach in october and had ot use her rescue inhaler due to SOB.    History of Present Illness: Nancy [name] is a 46 y.o. female former smoker with asthma on xolair.  She went to the beach.  She has noticed more trouble with her breathing since.  She was given prednisone and Abx from her PCP.  She finished her prednisone today.  She is still taking doxycycline started earlier this week.  She is using her albuterol 4 times per day, and this helps.  She still gets cough with chest tightness.  She feels more short of breath.  She is not having wheeze, fever, sinus congestion, chest pain, or abdominal symptoms.  She feels better than before, but fully recovered.  Tests: Spirometry 03/05/10>>FEV1 2.43(87%), FEV1% 93 IgE 2008>>412 IgE 04/01/11>>162.1 Xolair from 11/23/07 to 03/05/10>>stopped due to insurance issues RAST 04/01/11>>Cat dander, dog dander, dust mites PFT 05/28/11>>FEV1 3.01(112%), FEV1% 78, TLC 5.64(112%), RV 2.28(135%), DLCO 99%, positive bronchodilator response. December 2012 >> resume xolair  Past Medical History  Diagnosis Date  . Migraine headache   . GERD (gastroesophageal reflux disease)   . Serotonin syndrome   . Dyslipidemia   . Degenerative disk disease   . Asthma   . Bipolar 1 disorder   . Fibromyalgia   . Hiatal hernia   . Irritable bowel syndrome (IBS)   . Benign familial tremor   . Nephrolithiasis   . Allergic rhinitis   . Pneumonia 1986    Past Surgical History  Procedure Date  . Laparoscopic cholecystectomy   . Microdissection l5-s1 August 13, 2000  . Appendectomy   . Tonsillectomy   . Back fusion   . Ankle fracture  surgery 2006    Right  . Esophageal dilation August 06, 2004  . Cardiac catheterization November 27, 2006    minimal CAD  . Bronchoscopy     S. aureus from BAL  . Laser ablation of the cervix     Current Outpatient Prescriptions on File Prior to Visit  Medication Sig Dispense Refill  . albuterol (PROVENTIL HFA;VENTOLIN HFA) 108 (90 BASE) MCG/ACT inhaler Inhale 2 puffs into the lungs every 6 (six) hours as needed.  1 Inhaler  5  . albuterol (PROVENTIL) (2.5 MG/3ML) 0.083% nebulizer solution Take 2.5 mg by nebulization every 6 (six) hours as needed.        [name] amitriptyline (ELAVIL) 25 MG tablet Take 25 mg by mouth at bedtime.        . budesonide-formoterol (SYMBICORT) 160-4.5 MCG/ACT inhaler Inhale 2 puffs into the lungs 2 (two) times daily.        . Carbamazepine, Antipsychotic, (EQUETRO) 100 MG CP12 Take 500 mg by mouth daily.       . clonazePAM (KLONOPIN) 2 MG tablet Take 2 mg by mouth daily.        [name] doxycycline (VIBRA-TABS) 100 MG tablet Take 1 tablet (100 mg total) by mouth 2 (two) times daily.  14 tablet  0  . EPINEPHrine (EPI-PEN) 0.3 mg/0.3 mL DEVI As directed as needed for severe allergic reaction  1 Device  0  . fexofenadine (ALLEGRA) 180 MG tablet  Take 1 tablet (180 mg total) by mouth daily as needed (Allergies).      [name] lamoTRIgine (LAMICTAL) 200 MG tablet Take 200 mg by mouth 2 (two) times daily.        . montelukast (SINGULAIR) 10 MG tablet Take 10 mg by mouth at bedtime.       . primidone (MYSOLINE) 50 MG tablet Take 50 mg by mouth at bedtime.        [name] tiotropium (SPIRIVA HANDIHALER) 18 MCG inhalation capsule Place 1 capsule (18 mcg total) into inhaler and inhale daily.  30 capsule  5    Allergies  Allergen Reactions  . Cephalexin   . Cephalosporins   . Cymbalta (Duloxetine Hcl)   . Divalproex Sodium   . Effexor (Venlafaxine Hydrochloride)   . Geodon (Ziprasidone Hydrochloride)   . Hydrocodone   . Prozac (Fluoxetine Hcl)   . Theo-Dur (Theophylline)   . Thorazine  (Chlorpromazine Hcl)   . Topamax     Family History  Problem Relation Age of Onset  . Allergies Mother   . Allergies Brother   . Allergies Father   . Heart disease Father   . Clotting disorder Mother     Physical Exam: Filed Vitals:   04/28/12 1535  BP: 118/68  Pulse: 111  Temp: 98 F (36.7 C)  TempSrc: Oral  Height: 5\' 4"  (1.626 m)  Weight: 164 lb (74.39 kg)  SpO2: 96%  ,  Current Encounter SPO2  04/28/12 1535 96%  12/29/11 1402 96%  08/25/11 1631 95%    Wt Readings from Last 3 Encounters:  04/28/12 164 lb (74.39 kg)  12/29/11 163 lb 6.4 oz (74.118 kg)  08/25/11 157 lb (71.215 kg)    Body mass index is 28.15 kg/(m^2).   General - No distress ENT - No sinus tenderness, no oral exudate, no LAN, no thyromegaly, TM clear Cardiac - s1s2 regular, no murmur, pulses symmetric Chest - Prolonged exhalation, No wheeze/rales/dullness Back - No focal tenderness Abd - Soft, non-tender Ext - No edema Neuro - Normal strength Skin - No rashes Psych - Normal mood, and behavior.    Assessment/Plan:  Coralyn Helling, MD [place] [facility] Pager:  [phone] 04/28/2012, 3:46 PM

## 2012-04-28 NOTE — Patient Instructions (Signed)
Prednisone 5 mg pills >> 2 pills daily for one week, then one pill daily for 1 week Call if not feeling better Follow up in 3 months

## 2012-05-04 ENCOUNTER — Ambulatory Visit (INDEPENDENT_AMBULATORY_CARE_PROVIDER_SITE_OTHER): Payer: Medicare Other

## 2012-05-04 DIAGNOSIS — J45909 Unspecified asthma, uncomplicated: Secondary | ICD-10-CM

## 2012-05-04 MED ORDER — OMALIZUMAB 150 MG ~~LOC~~ SOLR
300.0000 mg | Freq: Once | SUBCUTANEOUS | Status: AC
  Administered 2012-05-04: 300 mg via SUBCUTANEOUS

## 2012-05-24 ENCOUNTER — Other Ambulatory Visit: Payer: Self-pay | Admitting: Pulmonary Disease

## 2012-06-01 ENCOUNTER — Ambulatory Visit (INDEPENDENT_AMBULATORY_CARE_PROVIDER_SITE_OTHER): Payer: Medicare Other

## 2012-06-01 DIAGNOSIS — J45909 Unspecified asthma, uncomplicated: Secondary | ICD-10-CM

## 2012-06-04 MED ORDER — OMALIZUMAB 150 MG ~~LOC~~ SOLR
300.0000 mg | Freq: Once | SUBCUTANEOUS | Status: AC
  Administered 2012-06-04: 300 mg via SUBCUTANEOUS

## 2012-06-29 ENCOUNTER — Ambulatory Visit (INDEPENDENT_AMBULATORY_CARE_PROVIDER_SITE_OTHER): Payer: Medicare Other

## 2012-06-29 DIAGNOSIS — J45909 Unspecified asthma, uncomplicated: Secondary | ICD-10-CM

## 2012-06-30 MED ORDER — OMALIZUMAB 150 MG ~~LOC~~ SOLR
300.0000 mg | Freq: Once | SUBCUTANEOUS | Status: AC
  Administered 2012-06-30: 300 mg via SUBCUTANEOUS

## 2012-07-27 ENCOUNTER — Ambulatory Visit (INDEPENDENT_AMBULATORY_CARE_PROVIDER_SITE_OTHER): Payer: Medicare Other

## 2012-07-27 DIAGNOSIS — J45909 Unspecified asthma, uncomplicated: Secondary | ICD-10-CM

## 2012-07-28 MED ORDER — OMALIZUMAB 150 MG ~~LOC~~ SOLR
300.0000 mg | Freq: Once | SUBCUTANEOUS | Status: AC
  Administered 2012-07-28: 300 mg via SUBCUTANEOUS

## 2012-07-30 ENCOUNTER — Ambulatory Visit (INDEPENDENT_AMBULATORY_CARE_PROVIDER_SITE_OTHER): Payer: Medicare Other | Admitting: Pulmonary Disease

## 2012-07-30 ENCOUNTER — Encounter: Payer: Self-pay | Admitting: Pulmonary Disease

## 2012-07-30 VITALS — BP 110/62 | HR 93 | Temp 98.0°F | Ht 64.5 in | Wt 167.6 lb

## 2012-07-30 DIAGNOSIS — J45990 Exercise induced bronchospasm: Secondary | ICD-10-CM | POA: Insufficient documentation

## 2012-07-30 DIAGNOSIS — J45909 Unspecified asthma, uncomplicated: Secondary | ICD-10-CM

## 2012-07-30 DIAGNOSIS — J4550 Severe persistent asthma, uncomplicated: Secondary | ICD-10-CM

## 2012-07-30 NOTE — Patient Instructions (Signed)
Follow up in 6 months 

## 2012-07-30 NOTE — Assessment & Plan Note (Signed)
Stable on current regimen with xolair, spiriva, symbicort, singulair, and prn albuterol.  I don't think she could tolerate step down therapy at this time.

## 2012-07-30 NOTE — Assessment & Plan Note (Signed)
Discussed warm up routine, and pretreatment with albuterol before exercising.

## 2012-07-30 NOTE — Progress Notes (Signed)
Chief Complaint  Patient presents with  . Follow-up    Breathing has been doing fine. C/o cough w/ occasional thich yellow-green phkem. no wheezing. Still gets SOB w/ exercise. Still on xolair.     History of Present Illness: Nancy [name] is a 47 y.o. female former smoker with asthma on xolair.  She is doing better than last visit.  She still gets occasional cough with thick, yellow sputum.  She denies chest tightness, wheezing, sinus congestion, gland swelling, fever, or skin rashes.  She uses her albuterol once or twice per week.  She is doing well with xolair injections.  She has started exercising more, and notices her breathing gets worse when she exercises.  Tests: Spirometry 03/05/10>>FEV1 2.43(87%), FEV1% 93 IgE 2008>>412 IgE 04/01/11>>162.1 Xolair from 11/23/07 to 03/05/10>>stopped due to insurance issues RAST 04/01/11>>Cat dander, dog dander, dust mites PFT 05/28/11>>FEV1 3.01(112%), FEV1% 78, TLC 5.64(112%), RV 2.28(135%), DLCO 99%, positive bronchodilator response. December 2012 >> resumed xolair  Past Medical History  Diagnosis Date  . Migraine headache   . GERD (gastroesophageal reflux disease)   . Serotonin syndrome   . Dyslipidemia   . Degenerative disk disease   . Asthma   . Bipolar 1 disorder   . Fibromyalgia   . Hiatal hernia   . Irritable bowel syndrome (IBS)   . Benign familial tremor   . Nephrolithiasis   . Allergic rhinitis   . Pneumonia 1986    Past Surgical History  Procedure Laterality Date  . Laparoscopic cholecystectomy    . Microdissection l5-s1  August 13, 2000  . Appendectomy    . Tonsillectomy    . Back fusion    . Ankle fracture surgery  2006    Right  . Esophageal dilation  August 06, 2004  . Cardiac catheterization  November 27, 2006    minimal CAD  . Bronchoscopy      S. aureus from BAL  . Laser ablation of the cervix      Current Outpatient Prescriptions on File Prior to Visit  Medication Sig Dispense Refill  .  albuterol (PROVENTIL HFA;VENTOLIN HFA) 108 (90 BASE) MCG/ACT inhaler Inhale 2 puffs into the lungs every 6 (six) hours as needed.  1 Inhaler  5  . albuterol (PROVENTIL) (2.5 MG/3ML) 0.083% nebulizer solution Take 2.5 mg by nebulization every 6 (six) hours as needed.        [name] amitriptyline (ELAVIL) 25 MG tablet Take 25 mg by mouth at bedtime.        . budesonide-formoterol (SYMBICORT) 160-4.5 MCG/ACT inhaler Inhale 2 puffs into the lungs 2 (two) times daily.        . Carbamazepine, Antipsychotic, (EQUETRO) 100 MG CP12 Take 500 mg by mouth daily.       . clonazePAM (KLONOPIN) 2 MG tablet Take 2 mg by mouth daily.        [name] lamoTRIgine (LAMICTAL) 200 MG tablet Take 200 mg by mouth 2 (two) times daily.        . montelukast (SINGULAIR) 10 MG tablet Take 10 mg by mouth at bedtime.       . primidone (MYSOLINE) 50 MG tablet Take 50 mg by mouth at bedtime.        [name] SPIRIVA HANDIHALER 18 MCG inhalation capsule INHALE CONTENTS OF 1 CAPSULE BY MOUTH EVERY DAY USING HAND HELD DEVICE  30 each  5   No current facility-administered medications on file prior to visit.    Allergies  Allergen Reactions  . Cephalexin   .  Cephalosporins   . Cymbalta (Duloxetine Hcl)   . Divalproex Sodium   . Effexor (Venlafaxine Hydrochloride)   . Geodon (Ziprasidone Hydrochloride)   . Hydrocodone   . Prozac (Fluoxetine Hcl)   . Theo-Dur (Theophylline)   . Thorazine (Chlorpromazine Hcl)   . Topamax     Family History  Problem Relation Age of Onset  . Allergies Mother   . Allergies Brother   . Allergies Father   . Heart disease Father   . Clotting disorder Mother     Physical Exam: Filed Vitals:   07/30/12 1335 07/30/12 1337  BP:  110/62  Pulse:  93  Temp: 98 F (36.7 C)   TempSrc: Oral   Height: 5' 4.5" (1.638 m)   Weight: 167 lb 9.6 oz (76.023 kg)   SpO2:  97%  ,  Current Encounter SPO2  07/30/12 1337 97%  04/28/12 1535 96%  12/29/11 1402 96%    Wt Readings from Last 3 Encounters:  07/30/12 167 lb  9.6 oz (76.023 kg)  04/28/12 164 lb (74.39 kg)  12/29/11 163 lb 6.4 oz (74.118 kg)    Body mass index is 28.33 kg/(m^2).   General - No distress ENT - No sinus tenderness, no oral exudate, no LAN Cardiac - s1s2 regular, no murmur, pulses symmetric Chest - No wheeze/rales/dullness Back - No focal tenderness Abd - Soft, non-tender Ext - No edema Neuro - Normal strength Skin - No rashes Psych - Normal mood, and behavior.    Assessment/Plan:  Coralyn Helling, MD [facility] Pager:  [phone] 07/30/2012, 1:39 PM

## 2012-08-24 ENCOUNTER — Ambulatory Visit (INDEPENDENT_AMBULATORY_CARE_PROVIDER_SITE_OTHER): Payer: Medicare Other

## 2012-08-24 DIAGNOSIS — J45909 Unspecified asthma, uncomplicated: Secondary | ICD-10-CM

## 2012-08-25 MED ORDER — OMALIZUMAB 150 MG ~~LOC~~ SOLR
300.0000 mg | Freq: Once | SUBCUTANEOUS | Status: AC
  Administered 2012-08-25: 300 mg via SUBCUTANEOUS

## 2012-09-21 ENCOUNTER — Ambulatory Visit (INDEPENDENT_AMBULATORY_CARE_PROVIDER_SITE_OTHER): Payer: Medicare Other

## 2012-09-21 DIAGNOSIS — J45909 Unspecified asthma, uncomplicated: Secondary | ICD-10-CM

## 2012-09-21 MED ORDER — OMALIZUMAB 150 MG ~~LOC~~ SOLR
300.0000 mg | Freq: Once | SUBCUTANEOUS | Status: AC
  Administered 2012-09-21: 300 mg via SUBCUTANEOUS

## 2012-10-19 ENCOUNTER — Ambulatory Visit (INDEPENDENT_AMBULATORY_CARE_PROVIDER_SITE_OTHER): Payer: Medicare Other

## 2012-10-19 DIAGNOSIS — J45909 Unspecified asthma, uncomplicated: Secondary | ICD-10-CM

## 2012-10-19 MED ORDER — OMALIZUMAB 150 MG ~~LOC~~ SOLR
300.0000 mg | Freq: Once | SUBCUTANEOUS | Status: AC
  Administered 2012-10-19: 300 mg via SUBCUTANEOUS

## 2012-11-23 ENCOUNTER — Other Ambulatory Visit: Payer: Self-pay | Admitting: Pulmonary Disease

## 2013-02-10 ENCOUNTER — Ambulatory Visit (INDEPENDENT_AMBULATORY_CARE_PROVIDER_SITE_OTHER): Payer: Medicare Other | Admitting: Pulmonary Disease

## 2013-02-10 ENCOUNTER — Encounter: Payer: Self-pay | Admitting: Pulmonary Disease

## 2013-02-10 VITALS — BP 112/78 | HR 97 | Temp 97.3°F | Ht 64.0 in | Wt 165.8 lb

## 2013-02-10 DIAGNOSIS — J45909 Unspecified asthma, uncomplicated: Secondary | ICD-10-CM

## 2013-02-10 DIAGNOSIS — J4550 Severe persistent asthma, uncomplicated: Secondary | ICD-10-CM

## 2013-02-10 NOTE — Progress Notes (Signed)
Chief Complaint  Patient presents with  . Follow-up    Pt states that since D/C Xolair she has noticed a big difference in her breathing. increased SOB.  Having to use hFA more frequent.     History of Present Illness: Nancy [name] is a 47 y.o. female former smoker with asthma previously on xolair.  Since she was married she lost eligibility for xolair assistance program.  Her asthma has gotten worse since she has been off [employer].  She gets frequent episodes of chest tightness and cough, and this happens at night.  She uses her albuterol 2 to 5 times per day.  She is not having sinus congestion, post nasal drip, sore throat, or reflux.  Tests: Spirometry 03/05/10>>FEV1 2.43(87%), FEV1% 93 IgE 2008>>412 IgE 04/01/11>>162.1 Xolair from 11/23/07 to 03/05/10>>stopped due to insurance issues RAST 04/01/11>>Cat dander, dog dander, dust mites PFT 05/28/11>>FEV1 3.01(112%), FEV1% 78, TLC 5.64(112%), RV 2.28(135%), DLCO 99%, positive bronchodilator response. December 2012 >> resumed xolair  She  has a past medical history of Migraine headache; GERD (gastroesophageal reflux disease); Serotonin syndrome; Dyslipidemia; Degenerative disk disease; Asthma; Bipolar 1 disorder; Fibromyalgia; Hiatal hernia; Irritable bowel syndrome (IBS); Benign familial tremor; Nephrolithiasis; Allergic rhinitis; and Pneumonia (1986).  She  has past surgical history that includes Laparoscopic cholecystectomy; MICRODISSECTION L5-S1 (August 13, 2000); Appendectomy; Tonsillectomy; back fusion; Ankle fracture surgery (2006); Esophageal dilation (August 06, 2004); Cardiac catheterization (November 27, 2006); Bronchoscopy; and Laser ablation of the cervix.   Current Outpatient Prescriptions on File Prior to Visit  Medication Sig Dispense Refill  . albuterol (PROVENTIL HFA;VENTOLIN HFA) 108 (90 BASE) MCG/ACT inhaler Inhale 2 puffs into the lungs every 6 (six) hours as needed.  1 Inhaler  5  . albuterol (PROVENTIL) (2.5  MG/3ML) 0.083% nebulizer solution Take 2.5 mg by nebulization every 6 (six) hours as needed.        [name] amitriptyline (ELAVIL) 25 MG tablet Take 25 mg by mouth at bedtime.        . budesonide-formoterol (SYMBICORT) 160-4.5 MCG/ACT inhaler Inhale 2 puffs into the lungs 2 (two) times daily.        . clonazePAM (KLONOPIN) 2 MG tablet Take 2 mg by mouth daily.        [name] lamoTRIgine (LAMICTAL) 200 MG tablet Take 200 mg by mouth daily.       . montelukast (SINGULAIR) 10 MG tablet Take 10 mg by mouth at bedtime.       . primidone (MYSOLINE) 50 MG tablet Take 50 mg by mouth at bedtime.        [name] SPIRIVA HANDIHALER 18 MCG inhalation capsule INHALE CONTENTS OF CAPSULE EVEYR DAY  30 capsule  5   No current facility-administered medications on file prior to visit.    Allergies  Allergen Reactions  . Cephalexin   . Cephalosporins   . Cymbalta [Duloxetine Hcl]   . Divalproex Sodium   . Effexor [Venlafaxine Hydrochloride]   . Geodon [Ziprasidone Hydrochloride]   . Hydrocodone   . Prozac [Fluoxetine Hcl]   . Theo-Dur [Theophylline]   . Thorazine [Chlorpromazine Hcl]   . Topamax     Family History  Problem Relation Age of Onset  . Allergies Mother   . Allergies Brother   . Allergies Father   . Heart disease Father   . Clotting disorder Mother     Physical Exam:  General - No distress ENT - No sinus tenderness, no oral exudate, no LAN Cardiac - s1s2 regular, no murmur, pulses symmetric  Chest - No wheeze/rales/dullness Back - No focal tenderness Abd - Soft, non-tender Ext - No edema Neuro - Normal strength Skin - No rashes Psych - Normal mood, and behavior.    Assessment/Plan:  Coralyn Helling, MD [place] Pager:  [phone] 02/10/2013, 2:11 PM

## 2013-02-10 NOTE — Assessment & Plan Note (Signed)
She unfortunately can not afford xolair anymore, and reports that she no longer qualifies for assistance after she was married.  Will check with [facility] to see if there is some other assistance program.  She is to continue her other regimen.  Advised her to also try OTC anti-histamine.  Also discussed option of bronchothermoplasty >> don't think she needs this at present.

## 2013-02-10 NOTE — Progress Notes (Deleted)
  Subjective:    Patient ID: Nancy [name], female    DOB: 02-Nov-1965, 47 y.o.   MRN: [id number]  HPI    Review of Systems  Constitutional: Negative for fever and unexpected weight change.  HENT: Negative for ear pain, nosebleeds, congestion, sore throat, rhinorrhea, sneezing, trouble swallowing, dental problem, postnasal drip and sinus pressure.   Eyes: Negative for redness and itching.  Respiratory: Negative for cough, chest tightness, shortness of breath and wheezing.   Cardiovascular: Negative for palpitations and leg swelling.  Gastrointestinal: Negative for nausea and vomiting.  Genitourinary: Negative for dysuria.  Musculoskeletal: Negative for joint swelling.  Skin: Negative for rash.  Neurological: Negative for headaches.  Hematological: Does not bruise/bleed easily.  Psychiatric/Behavioral: Negative for dysphoric mood. The patient is not nervous/anxious.        Objective:   Physical Exam        Assessment & Plan:

## 2013-02-10 NOTE — Patient Instructions (Signed)
Try using over the counter anti-histamine such as zyrtec Follow up in 6 months

## 2013-02-22 ENCOUNTER — Telehealth: Payer: Self-pay | Admitting: Pulmonary Disease

## 2013-02-22 NOTE — Telephone Encounter (Signed)
Message copied by Ludwig Clarks on Tue Feb 22, 2013  4:29 PM ------      Message from: Coralyn Helling      Created: Tue Feb 22, 2013  2:44 PM       Can you please update me on this.  I haven't heard anything further about whether Ms. Bergthold can get financial assistance for [employer].            Thanks.            V            ----- Message -----         From: Ollen Gross         Sent: 02/10/2013   3:58 PM           To: Coralyn Helling, MD            Chantel handles all the xolair now. I will ask her to come and speak with you.      Thanks,      Bjorn Loser      ----- Message -----         From: Coralyn Helling, MD         Sent: 02/10/2013   2:25 PM           To: Ollen Gross            Ms. Reeg was on xolair, but then lost assistance after she got married.  Her asthma has gotten worse.  Is there any option to get her back on xolair?  Thanks.            V             ------

## 2013-02-22 NOTE — Telephone Encounter (Signed)
Noted. Appreciate follow up.

## 2013-02-22 NOTE — Telephone Encounter (Signed)
Dr Craige Cotta this patients info has been sent to [facility] we are waiting on them to do the benefit investagation and see what kind of help she can get and then they will contact me sometimes it can be a few weeks

## 2013-02-25 ENCOUNTER — Other Ambulatory Visit: Payer: Self-pay | Admitting: Orthopaedic Surgery

## 2013-02-25 DIAGNOSIS — M545 Low back pain: Secondary | ICD-10-CM

## 2013-03-02 ENCOUNTER — Telehealth: Payer: Self-pay | Admitting: Pulmonary Disease

## 2013-03-02 MED ORDER — EPINEPHRINE 0.3 MG/0.3ML IJ SOAJ: 0.3000 mg | Freq: Once | INTRAMUSCULAR | Status: DC

## 2013-03-02 NOTE — Telephone Encounter (Signed)
Pt stated she was approved for grant for PANF-letter of approval printed by Chantel and scanned in EPIC for our records. Pt is buy and bill from [facility] pharmacy. Pt is aware of this and states she would like to restart Xolair injections on Tuesday 03-08-13; Rx ordered from [facility] pharmacy and should arrive in allergy lab on Friday 03-04-13. Pt is aware she will need to bring her Epipen with her to this visit; co pay card mailed to patient at [address] [place], [place] [address] at patients request. Rx sent to [facility] at patients request as well.    I will speak directly with VS on Thursday 03-03-13 as to how long patient will need to wait after restarting her Xolair injections again( last injection was 10-2012)   VS stopped by office today and Lillia Abed asked VS about wait time. He states pt will need to wait the full 2 hours as she is considered at restart. Pt is aware and will wait 2 hours. Will sign off on message.

## 2013-03-04 ENCOUNTER — Ambulatory Visit (INDEPENDENT_AMBULATORY_CARE_PROVIDER_SITE_OTHER): Payer: Medicare Other | Admitting: Pulmonary Disease

## 2013-03-04 ENCOUNTER — Encounter: Payer: Self-pay | Admitting: Pulmonary Disease

## 2013-03-04 VITALS — BP 112/68 | HR 106 | Temp 97.6°F | Ht 64.0 in | Wt 164.0 lb

## 2013-03-04 DIAGNOSIS — J45901 Unspecified asthma with (acute) exacerbation: Secondary | ICD-10-CM

## 2013-03-04 DIAGNOSIS — J4551 Severe persistent asthma with (acute) exacerbation: Secondary | ICD-10-CM

## 2013-03-04 DIAGNOSIS — J209 Acute bronchitis, unspecified: Secondary | ICD-10-CM | POA: Insufficient documentation

## 2013-03-04 MED ORDER — PREDNISONE 10 MG PO TABS: ORAL_TABLET | ORAL | Status: DC

## 2013-03-04 NOTE — Progress Notes (Signed)
Chief Complaint  Patient presents with  . Acute Visit    Reports SOB, coughing. Was dx with bronchitis and was given abx and pred taper. No relief and feels like she has to use HFA more ofter.    History of Present Illness: Nancy [name] is a 47 y.o. female former smoker with asthma previously on xolair.  She developed bronchitis about 6 days ago.  She was seen by her PCP and given depo medrol shot.  She was then started on levaquin 500 mg daily for 10 days, and pred-pak.  She is still needing to use her albuterol inhaler every 4 to 6 hours.    She is having barking cough with occasional thick sputum.  She is still getting chest tightness and wheeze.  She is not having wheeze.  She denies sinus congestion, sore throat, or reflux.  Tests: Spirometry 03/05/10>>FEV1 2.43(87%), FEV1% 93 IgE 2008>>412 IgE 04/01/11>>162.1 Xolair from 11/23/07 to 03/05/10>>stopped due to insurance issues RAST 04/01/11>>Cat dander, dog dander, dust mites PFT 05/28/11>>FEV1 3.01(112%), FEV1% 78, TLC 5.64(112%), RV 2.28(135%), DLCO 99%, positive bronchodilator response. December 2012 >> resumed xolair  She  has a past medical history of Migraine headache; GERD (gastroesophageal reflux disease); Serotonin syndrome; Dyslipidemia; Degenerative disk disease; Asthma; Bipolar 1 disorder; Fibromyalgia; Hiatal hernia; Irritable bowel syndrome (IBS); Benign familial tremor; Nephrolithiasis; Allergic rhinitis; and Pneumonia (1986).  She  has past surgical history that includes Laparoscopic cholecystectomy; MICRODISSECTION L5-S1 (August 13, 2000); Appendectomy; Tonsillectomy; back fusion; Ankle fracture surgery (2006); Esophageal dilation (August 06, 2004); Cardiac catheterization (November 27, 2006); Bronchoscopy; and Laser ablation of the cervix.   Current Outpatient Prescriptions on File Prior to Visit  Medication Sig Dispense Refill  . albuterol (PROVENTIL HFA;VENTOLIN HFA) 108 (90 BASE) MCG/ACT inhaler Inhale 2  puffs into the lungs every 6 (six) hours as needed.  1 Inhaler  5  . albuterol (PROVENTIL) (2.5 MG/3ML) 0.083% nebulizer solution Take 2.5 mg by nebulization every 6 (six) hours as needed.        [name] amitriptyline (ELAVIL) 25 MG tablet Take 25 mg by mouth at bedtime.        . budesonide-formoterol (SYMBICORT) 160-4.5 MCG/ACT inhaler Inhale 2 puffs into the lungs 2 (two) times daily.        . clonazePAM (KLONOPIN) 2 MG tablet Take 2 mg by mouth daily.        [name] EPINEPHrine (EPI-PEN) 0.3 mg/0.3 mL SOAJ injection Inject 0.3 mLs (0.3 mg total) into the muscle once.  1 Device  11  . lamoTRIgine (LAMICTAL) 200 MG tablet Take 200 mg by mouth daily.       . montelukast (SINGULAIR) 10 MG tablet Take 10 mg by mouth at bedtime.       . primidone (MYSOLINE) 50 MG tablet Take 50 mg by mouth at bedtime.        [name] QUEtiapine (SEROQUEL XR) 50 MG TB24 24 hr tablet Take 100 mg by mouth daily.      [name] SPIRIVA HANDIHALER 18 MCG inhalation capsule INHALE CONTENTS OF CAPSULE EVEYR DAY  30 capsule  5   No current facility-administered medications on file prior to visit.    Allergies  Allergen Reactions  . Cephalexin   . Cephalosporins   . Cymbalta [Duloxetine Hcl]   . Divalproex Sodium   . Effexor [Venlafaxine Hydrochloride]   . Geodon [Ziprasidone Hydrochloride]   . Hydrocodone   . Prozac [Fluoxetine Hcl]   . Theo-Dur [Theophylline]   . Thorazine [Chlorpromazine Hcl]   .  Topamax     Family History  Problem Relation Age of Onset  . Allergies Mother   . Allergies Brother   . Allergies Father   . Heart disease Father   . Clotting disorder Mother     Physical Exam:  General - using accessory muscles >> improved after neb tx in office ENT - No sinus tenderness, no oral exudate, no LAN Cardiac - s1s2 regular, no murmur, pulses symmetric Chest - minimal air movement with faint wheeze >> improved after neb tx in office Back - No focal tenderness Abd - Soft, non-tender Ext - No edema Neuro - Normal  strength Skin - No rashes Psych - Normal mood, and behavior    Assessment/Plan:  Nancy Helling, MD [facility] Pager:  [phone] 03/04/2013, 4:35 PM

## 2013-03-04 NOTE — Assessment & Plan Note (Signed)
She has slow to resolve exacerbation.  She had improvement in symptoms with office nebulizer tx.  I don't think she needs hospital admission at this time.  She is to complete Abx and prednisone from her PCP.  I have given additional script for prednisone that she can fill if needed.  She is to use albuterol nebulizer every 4 to 6 hours as needed.  She is to continue symbicort, spiriva, and singulair.  She will resume xolair next week now that she has been approved for financial assistance.

## 2013-03-04 NOTE — Patient Instructions (Signed)
Fill prescription for prednisone if not feeling better Follow up in 2 months

## 2013-03-05 ENCOUNTER — Ambulatory Visit
Admission: RE | Admit: 2013-03-05 | Discharge: 2013-03-05 | Disposition: A | Discharge status: Home / Self Care | Payer: Medicare Other | Source: Ambulatory Visit | Attending: Orthopaedic Surgery | Admitting: Orthopaedic Surgery

## 2013-03-05 DIAGNOSIS — M545 Low back pain: Secondary | ICD-10-CM

## 2013-03-05 MED ORDER — GADOBENATE DIMEGLUMINE 529 MG/ML IV SOLN
15.0000 mL | Freq: Once | INTRAVENOUS | Status: AC | PRN
  Administered 2013-03-05: 15 mL via INTRAVENOUS

## 2013-03-08 ENCOUNTER — Ambulatory Visit (INDEPENDENT_AMBULATORY_CARE_PROVIDER_SITE_OTHER): Payer: Medicare Other

## 2013-03-08 DIAGNOSIS — J4550 Severe persistent asthma, uncomplicated: Secondary | ICD-10-CM

## 2013-03-08 DIAGNOSIS — J45909 Unspecified asthma, uncomplicated: Secondary | ICD-10-CM

## 2013-03-08 DIAGNOSIS — Z23 Encounter for immunization: Secondary | ICD-10-CM

## 2013-03-09 MED ORDER — OMALIZUMAB 150 MG ~~LOC~~ SOLR
300.0000 mg | Freq: Once | SUBCUTANEOUS | Status: AC
  Administered 2013-03-09: 300 mg via SUBCUTANEOUS

## 2013-04-05 ENCOUNTER — Ambulatory Visit (INDEPENDENT_AMBULATORY_CARE_PROVIDER_SITE_OTHER): Payer: Medicare Other

## 2013-04-05 DIAGNOSIS — J45909 Unspecified asthma, uncomplicated: Secondary | ICD-10-CM

## 2013-04-05 DIAGNOSIS — J4550 Severe persistent asthma, uncomplicated: Secondary | ICD-10-CM

## 2013-04-05 MED ORDER — OMALIZUMAB 150 MG ~~LOC~~ SOLR
300.0000 mg | Freq: Once | SUBCUTANEOUS | Status: AC
  Administered 2013-04-05: 300 mg via SUBCUTANEOUS

## 2013-05-04 ENCOUNTER — Encounter: Payer: Self-pay | Admitting: Adult Health

## 2013-05-04 ENCOUNTER — Ambulatory Visit (INDEPENDENT_AMBULATORY_CARE_PROVIDER_SITE_OTHER): Payer: Medicare Other | Admitting: Adult Health

## 2013-05-04 ENCOUNTER — Ambulatory Visit (INDEPENDENT_AMBULATORY_CARE_PROVIDER_SITE_OTHER): Payer: Medicare Other

## 2013-05-04 VITALS — BP 126/76 | HR 99 | Temp 98.7°F | Ht 63.5 in | Wt 171.8 lb

## 2013-05-04 DIAGNOSIS — J45909 Unspecified asthma, uncomplicated: Secondary | ICD-10-CM

## 2013-05-04 DIAGNOSIS — J4550 Severe persistent asthma, uncomplicated: Secondary | ICD-10-CM

## 2013-05-04 MED ORDER — OMALIZUMAB 150 MG ~~LOC~~ SOLR
300.0000 mg | Freq: Once | SUBCUTANEOUS | Status: AC
  Administered 2013-05-04: 300 mg via SUBCUTANEOUS

## 2013-05-04 NOTE — Progress Notes (Signed)
   Subjective:    Patient ID: Nancy [name], female    DOB: 03-26-1966, 47 y.o.   MRN: [id number]  HPI Nancy [name] is a 46 y.o. female former smoker with asthma previously on xolair.  05/04/2013 Follow up Asthma  2 month follow up Asthma : reports dry cough, some chest tightness x2days.  denies f/c/s, dyspnea, wheezing, mucus production, head congestion, PND, nausea, vomiting, hemoptysis.  would like to know if she may have her Xolair today.  Had recent bout with bronchitis , seen by PCP 2 weeks , tx w/ Levaquin and prednisone .  Patient denies any hemoptysis, orthopnea, PND, or leg swelling. She reports that her cough and congestion are improving. She has finished her prednisone and Levaquin. She's had no emergency room or hospitalization since last visit. She uses her rescue inhaler on average 1 times daily   Tests: Spirometry 03/05/10>>FEV1 2.43(87%), FEV1% 93 IgE 2008>>412 IgE 04/01/11>>162.1 Xolair from 11/23/07 to 03/05/10>>stopped due to insurance issues RAST 04/01/11>>Cat dander, dog dander, dust mites PFT 05/28/11>>FEV1 3.01(112%), FEV1% 78, TLC 5.64(112%), RV 2.28(135%), DLCO 99%, positive bronchodilator response. December 2012 >> resumed xolair     Review of Systems Constitutional:   No  weight loss, night sweats,  Fevers, chills, fatigue, or  lassitude.  HEENT:   No headaches,  Difficulty swallowing,  Tooth/dental problems, or  Sore throat,                No sneezing, itching, ear ache,  +nasal congestion, post nasal drip,   CV:  No chest pain,  Orthopnea, PND, swelling in lower extremities, anasarca, dizziness, palpitations, syncope.   GI  No heartburn, indigestion, abdominal pain, nausea, vomiting, diarrhea, change in bowel habits, loss of appetite, bloody stools.   Resp:  .  No chest wall deformity  Skin: no rash or lesions.  GU: no dysuria, change in color of urine, no urgency or frequency.  No flank pain, no hematuria   MS:  No joint pain or  swelling.  No decreased range of motion.  No back pain.  Psych:  No change in mood or affect. No depression or anxiety.  No memory loss.         Objective:   Physical Exam GEN: A/Ox3; pleasant , NAD, well nourished   HEENT:  [place]/AT,  EACs-clear, TMs-wnl, NOSE-clear drainage  THROAT-clear, no lesions, no postnasal drip or exudate noted.   NECK:  Supple w/ fair ROM; no JVD; normal carotid impulses w/o bruits; no thyromegaly or nodules palpated; no lymphadenopathy.  RESP  Clear  P & A; w/o, wheezes/ rales/ or rhonchi.no accessory muscle use, no dullness to percussion  CARD:  RRR, no m/r/g  , no peripheral edema, pulses intact, no cyanosis or clubbing.  GI:   Soft & nt; nml bowel sounds; no organomegaly or masses detected.  Musco: Warm bil, no deformities or joint swelling noted.   Neuro: alert, no focal deficits noted.    Skin: Warm, no lesions or rashes         Assessment & Plan:

## 2013-05-04 NOTE — Patient Instructions (Signed)
Continue on Symbicort 2 puffs twice daily. Continue on Spiriva 1 puff daily. May add chlorpheniramine 4 mg 2 tablets at bedtime as needed. For drainage. Mucinex DM twice daily as needed. For cough and congestion. May give Xolair injection today. Follow up Dr. Craige Cotta in 2 months and as needed

## 2013-05-04 NOTE — Assessment & Plan Note (Signed)
Recurrent exacerbations  Now resolving  Control triggers   Plan  Continue on Symbicort 2 puffs twice daily. Continue on Spiriva 1 puff daily. May add chlorpheniramine 4 mg 2 tablets at bedtime as needed. For drainage. Mucinex DM twice daily as needed. For cough and congestion. May give Xolair injection today. Follow up Dr. Craige Cotta in 2 months and as needed

## 2013-05-09 NOTE — Progress Notes (Signed)
Reviewed and agree with assessment/plan. 

## 2013-05-13 ENCOUNTER — Encounter: Payer: Self-pay | Admitting: Pulmonary Disease

## 2013-06-01 ENCOUNTER — Ambulatory Visit (INDEPENDENT_AMBULATORY_CARE_PROVIDER_SITE_OTHER): Payer: Medicare Other

## 2013-06-01 DIAGNOSIS — J45909 Unspecified asthma, uncomplicated: Secondary | ICD-10-CM

## 2013-06-01 DIAGNOSIS — J309 Allergic rhinitis, unspecified: Secondary | ICD-10-CM

## 2013-06-06 MED ORDER — OMALIZUMAB 150 MG ~~LOC~~ SOLR
300.0000 mg | Freq: Once | SUBCUTANEOUS | Status: AC
  Administered 2013-06-06: 300 mg via SUBCUTANEOUS

## 2013-06-14 ENCOUNTER — Ambulatory Visit (INDEPENDENT_AMBULATORY_CARE_PROVIDER_SITE_OTHER): Payer: Medicare Other | Admitting: Neurology

## 2013-06-14 ENCOUNTER — Encounter: Payer: Self-pay | Admitting: Neurology

## 2013-06-14 VITALS — BP 112/74 | HR 80 | Resp 16 | Ht 64.0 in | Wt 171.6 lb

## 2013-06-14 DIAGNOSIS — R413 Other amnesia: Secondary | ICD-10-CM

## 2013-06-14 LAB — VITAMIN B12: Vitamin B-12: 432 pg/mL (ref 211–911)

## 2013-06-14 NOTE — Patient Instructions (Addendum)
1.  We will check some blood tests. 2.  Neuropsychological testing 3.  Follow up soon after tests  I will refer you to [facility] for the neuropsychological testing. They will call you with an appointment.  Follow up with Dr. Tomi Likens after testing completed.

## 2013-06-14 NOTE — Progress Notes (Signed)
NEUROLOGY CONSULTATION NOTE  Nancy [name] MRN: [id number] DOB: [birth year]  Referring provider: Dr. Toy Care (psychiatrist) Primary care provider: Dr. Woody Seller  Reason for consult:  Memory problems.  HISTORY OF PRESENT ILLNESS: Nancy [name] is a 48 year old right-handed woman with history of Bipolar disorder, serotonin syndrome, tremor, unspecified polyneuropathy, asthma and fibromyalgia who presents for memory problems.  Records and images were personally reviewed where available.   Her symptoms started back in 2005 when she was diagnosed with serotonin syndrome.  She presented with tremor, myoclonic jerking and intermittent confusion.  She was on several neuroleptic medications and Zoloft and Cymbalta were stopped.  Since that time, she has had trouble with memory, primarily long-term memory.  She reports that she has forgotten the year her daughter was born and would frequently tell people a different year.  She kept thinking she was the same age over a period of 3 years.  She remembers very little details of her childhood.  She does not recall different things that happened when her childhood friend brings them up.  She would look at family Christmas photos, which would seem unfamiliar.  She also notes some short-term memory problems, although this is not as severe.  She reports frequently forgetting to shut off the oven.  She has a set grooming routine, and would often forget some procedures such as washing her hair, putting on deodorant or putting on makeup.  She will often forget where she wanted to drive, however she is not disoriented and does not forget how to get there.  She has forgotten to pick up her stepson from school and needs to use an alarm clock to remind her.  She often gets confused registering numbers correctly when someone tells her a phone number or address.  She helps pay the bills and reports some problems with that, as well.  She denies forgetting names of family  members.  She has been evaluated for this in the past.  She had a neuropsychological evaluation performed on 06/18/05. Findings were most suggestive of psychogenic factors such as stress and depression, rather than organic cognitive impairment.  There has been no significant progression in symptoms over the years and feels that maybe the short-term memory is a little better.  She does have depression.  Her maternal grandmother had Alzheimer's disease.  Medications include:  Klonopin 2mg  five times daily PRN for anxiety, Seroquel 50mg  2-3tabs at bedtime, Tegretol 300mg  at bedtime, Elavil 25mg  at bedtime, Mysoline, Symbicort, Spiriva and Singulair.  She had an MRI of the brain with and without contrast performed on 10/26/03 reportedly looking for possible demyelinating disease, as she presented at that time with leg weakness.  It was normal. Prior labs from 01/23/04 revealed an elevated TSH of 6.82.  PAST MEDICAL HISTORY: Past Medical History  Diagnosis Date  . Migraine headache   . GERD (gastroesophageal reflux disease)   . Serotonin syndrome   . Dyslipidemia   . Degenerative disk disease   . Asthma   . Bipolar 1 disorder   . Fibromyalgia   . Hiatal hernia   . Irritable bowel syndrome (IBS)   . Benign familial tremor   . Nephrolithiasis   . Allergic rhinitis   . Pneumonia 1986  . Memory loss     PAST SURGICAL HISTORY: Past Surgical History  Procedure Laterality Date  . Laparoscopic cholecystectomy    . Microdissection l5-s1  August 13, 2000  . Appendectomy    . Tonsillectomy    .  Back fusion    . Ankle fracture surgery  2006    Right  . Esophageal dilation  August 06, 2004  . Cardiac catheterization  November 27, 2006    minimal CAD  . Bronchoscopy      S. aureus from BAL  . Laser ablation of the cervix      MEDICATIONS: Current Outpatient Prescriptions on File Prior to Visit  Medication Sig Dispense Refill  . albuterol (PROVENTIL HFA;VENTOLIN HFA) 108 (90 BASE) MCG/ACT  inhaler Inhale 2 puffs into the lungs every 6 (six) hours as needed.  1 Inhaler  5  . albuterol (PROVENTIL) (2.5 MG/3ML) 0.083% nebulizer solution Take 2.5 mg by nebulization every 6 (six) hours as needed.        [name] amitriptyline (ELAVIL) 25 MG tablet Take 25 mg by mouth at bedtime.        . budesonide-formoterol (SYMBICORT) 160-4.5 MCG/ACT inhaler Inhale 2 puffs into the lungs 2 (two) times daily.        . cetirizine (ZYRTEC) 10 MG tablet Take 10 mg by mouth daily.      . clonazePAM (KLONOPIN) 2 MG tablet Take 2 mg by mouth daily.        [name] lamoTRIgine (LAMICTAL) 200 MG tablet Take 200 mg by mouth daily.       . montelukast (SINGULAIR) 10 MG tablet Take 10 mg by mouth at bedtime.       . primidone (MYSOLINE) 50 MG tablet Take 50 mg by mouth at bedtime.        [name] SPIRIVA HANDIHALER 18 MCG inhalation capsule INHALE CONTENTS OF CAPSULE EVEYR DAY  30 capsule  5  . EPINEPHrine (EPI-PEN) 0.3 mg/0.3 mL SOAJ injection Inject 0.3 mLs (0.3 mg total) into the muscle once.  1 Device  11   No current facility-administered medications on file prior to visit.    ALLERGIES: Allergies  Allergen Reactions  . Cephalexin   . Cephalosporins   . Cymbalta [Duloxetine Hcl]   . Divalproex Sodium   . Effexor [Venlafaxine Hydrochloride]   . Geodon [Ziprasidone Hydrochloride]   . Hydrocodone   . Prozac [Fluoxetine Hcl]   . Theo-Dur [Theophylline]   . Thorazine [Chlorpromazine Hcl]   . Topamax     FAMILY HISTORY: Family History  Problem Relation Age of Onset  . Allergies Mother   . Allergies Brother   . Allergies Father   . Heart disease Father   . Clotting disorder Mother     SOCIAL HISTORY: History   Social History  . Marital Status: Legally Separated    Spouse Name: N/A    Number of Children: N/A  . Years of Education: N/A   Occupational History  . disabled    Social History Main Topics  . Smoking status: Former Smoker -- 0.50 packs/day for 8 years    Types: Cigarettes    Quit date:  06/09/1978  . Smokeless tobacco: Not on file  . Alcohol Use: No  . Drug Use: No  . Sexual Activity: Not on file   Other Topics Concern  . Not on file   Social History Narrative  . No narrative on file    REVIEW OF SYSTEMS: Constitutional: No fevers, chills, or sweats, no generalized fatigue, change in appetite Eyes: No visual changes, double vision, eye pain Ear, nose and throat: No hearing loss, ear pain, nasal congestion, sore throat Cardiovascular: No chest pain, palpitations Respiratory:  asthma GastrointestinaI: No nausea, vomiting, diarrhea, abdominal pain, fecal incontinence Genitourinary:  No  dysuria, urinary retention or frequency Musculoskeletal:  No neck pain, back pain Integumentary: No rash, pruritus, skin lesions Neurological: as above Psychiatric: Depression, anxiety Endocrine: No palpitations, change in appetite, change in weight, increased thirst Hematologic/Lymphatic:  No anemia, purpura, petechiae. Allergic/Immunologic: no itchy/runny eyes, nasal congestion, recent allergic reactions, rashes  PHYSICAL EXAM: Filed Vitals:   06/14/13 0940  BP: 112/74  Pulse: 80  Resp: 16   General: No acute distress Head:  Normocephalic/atraumatic Neck: supple, no paraspinal tenderness, full range of motion Back: No paraspinal tenderness Heart: regular rate and rhythm Lungs: Clear to auscultation bilaterally. Vascular: No carotid bruits. Neurological Exam: Mental status: alert and oriented to person, place, and time, speech fluent and not dysarthric, language intact. Cranial nerves: CN I: not tested CN II: pupils equal, round and reactive to light, visual fields intact, fundi unremarkable. CN III, IV, VI:  full range of motion, no nystagmus, no ptosis CN V: facial sensation intact CN VII: upper and lower face symmetric CN VIII: hearing intact CN IX, X: gag intact, uvula midline CN XI: sternocleidomastoid and trapezius muscles intact CN XII: tongue midline Bulk &  Tone: normal, no fasciculations. Motor: 5/5 throughout Sensation: endorses reduced pinprick in feet up to ankles.  Vibration intact. Deep Tendon Reflexes: 2+ throughout except absent in ankles, toes down Finger to nose testing: postural and kinetic tremor in hands.  No dysmetria Heel to shin: no dysmetria Gait: Normal stride.  Difficulty with tandem walking, although does not seem organic. Romberg with sway, although does not seem organic.  IMPRESSION: Memory problems.  Symptoms have been ongoing and fairly stable for almost 10 years.  Unusual presentation for an organic neurodegenerative process, especially since symptoms would tend to primarily affect short term memory.  Also on I feel symptoms are most likely related to depression and anxiety.  PLAN: 1.  To be sure, we will repeat a neuropsychological evaluation 2.  We will check B12 and thyroid panel 3.  Follow up soon afterwards.  60 minutes spent with patient, over 50% spent counseling and coordinating care.  Thank you for allowing me to take part in the care of this patient.  Metta Clines, DO  CC:  Chucky May, MD  Jerene Bears, MD

## 2013-06-17 LAB — METHYLMALONIC ACID, SERUM: Methylmalonic Acid, Quant: 0.21 umol/L (ref <0.40)

## 2013-06-29 ENCOUNTER — Ambulatory Visit (INDEPENDENT_AMBULATORY_CARE_PROVIDER_SITE_OTHER): Payer: Medicare Other

## 2013-06-29 ENCOUNTER — Telehealth: Payer: Self-pay | Admitting: Neurology

## 2013-06-29 DIAGNOSIS — J45909 Unspecified asthma, uncomplicated: Secondary | ICD-10-CM

## 2013-06-29 NOTE — Telephone Encounter (Signed)
Schedule next follow up after the visit with neuropsychology

## 2013-06-29 NOTE — Telephone Encounter (Signed)
Please advise on the following question about appointment.

## 2013-06-29 NOTE — Telephone Encounter (Signed)
Patient called wanting to know when she can be seen with Dr. Tomi Likens. She states that Dr. Tomi Likens referred her to  [facility] phycologist and she is scheduled to see Dr. Louretta Parma ON 08/18/2012. Please confirm with Dr. Tomi Likens.

## 2013-06-30 MED ORDER — OMALIZUMAB 150 MG ~~LOC~~ SOLR
300.0000 mg | Freq: Once | SUBCUTANEOUS | Status: AC
  Administered 2013-06-30: 300 mg via SUBCUTANEOUS

## 2013-08-03 ENCOUNTER — Ambulatory Visit: Payer: Medicare Other

## 2013-08-04 ENCOUNTER — Ambulatory Visit: Payer: Medicare Other

## 2013-08-15 ENCOUNTER — Ambulatory Visit (INDEPENDENT_AMBULATORY_CARE_PROVIDER_SITE_OTHER): Payer: Medicare Other

## 2013-08-15 DIAGNOSIS — J45909 Unspecified asthma, uncomplicated: Secondary | ICD-10-CM

## 2013-08-17 MED ORDER — OMALIZUMAB 150 MG ~~LOC~~ SOLR
300.0000 mg | Freq: Once | SUBCUTANEOUS | Status: AC
  Administered 2013-08-17: 300 mg via SUBCUTANEOUS

## 2013-08-31 ENCOUNTER — Ambulatory Visit: Payer: Medicare Other | Admitting: Neurology

## 2013-08-31 ENCOUNTER — Telehealth: Payer: Self-pay | Admitting: Neurology

## 2013-08-31 NOTE — Telephone Encounter (Signed)
Pt called wanting to confirm if a report has been received from [facility]. Please call pt and confirm.

## 2013-09-01 ENCOUNTER — Ambulatory Visit (INDEPENDENT_AMBULATORY_CARE_PROVIDER_SITE_OTHER): Payer: Medicare Other | Admitting: Neurology

## 2013-09-01 ENCOUNTER — Encounter: Payer: Self-pay | Admitting: Neurology

## 2013-09-01 VITALS — BP 108/78 | HR 70 | Temp 98.0°F | Resp 18 | Ht 64.0 in | Wt 173.6 lb

## 2013-09-01 DIAGNOSIS — Z79899 Other long term (current) drug therapy: Secondary | ICD-10-CM

## 2013-09-01 DIAGNOSIS — R413 Other amnesia: Secondary | ICD-10-CM

## 2013-09-01 DIAGNOSIS — F411 Generalized anxiety disorder: Secondary | ICD-10-CM

## 2013-09-01 DIAGNOSIS — F419 Anxiety disorder, unspecified: Secondary | ICD-10-CM

## 2013-09-01 DIAGNOSIS — G4440 Drug-induced headache, not elsewhere classified, not intractable: Secondary | ICD-10-CM

## 2013-09-01 NOTE — Progress Notes (Signed)
NEUROLOGY FOLLOW UP OFFICE NOTE  Nancy [name] [id number]  HISTORY OF PRESENT ILLNESS: Nancy [name] is a 48 year old right-handed woman with history of Bipolar disorder, serotonin syndrome, tremor, unspecified polyneuropathy, asthma and fibromyalgia who follows up for memory problems.  She is accompanied by her mother.  Records and images were personally reviewed where available.    UPDATE: 06/14/13:  B12 432.  She underwent repeat neuropsychological testing on 08/18/13.  Results revealed poor problem solving, reduced processing speed, impaired complex attention and fluency, and poor spatial judgment.  Memory was intact.  Impression did not suspect an organic etiology but rather emotional stress or polypharmacy.  She has had a headache for about 3 weeks.  She received a shot three weeks ago which helped the severe headache but continues to have dull headache.  She takes ibuprofen around the clock.  HISTORY: Her symptoms started back in 2005 when she was diagnosed with serotonin syndrome.  She presented to [facility] with tremor, myoclonic jerking and intermittent confusion.  She was on several neuroleptic medications and Zoloft and Cymbalta were stopped.  Since that time, she has had trouble with memory, primarily long-term memory.  She reports that she has forgotten the year her daughter was born and would frequently tell people a different year.  She kept thinking she was the same age over a period of 3 years.  She remembers very little details of her childhood.  She does not recall different things that happened when her childhood friend brings them up.  She would look at family Christmas photos, which would seem unfamiliar.  She also notes some short-term memory problems, although this is not as severe.  She reports frequently forgetting to shut off the oven.  She has a set grooming routine, and would often forget some procedures such as washing her hair, putting on deodorant  or putting on makeup.  She will often forget where she wanted to drive, however she is not disoriented and does not forget how to get there.  She has forgotten to pick up her stepson from school and needs to use an alarm clock to remind her.  She often gets confused registering numbers correctly when someone tells her a phone number or address.  She helps pay the bills and reports some problems with that, as well.  She denies forgetting names of family members.  She has been evaluated for this in the past.  She had a neuropsychological evaluation performed at [facility] on 06/18/05. She exhibited deficits on word recognition, working memory, and executive functioning.  Findings were most suggestive of psychogenic factors such as stress and depression, rather than organic cognitive impairment.  There has been no significant progression in symptoms over the years and feels that maybe the short-term memory is a little better.  She does have depression.  Her maternal grandmother had Alzheimer's disease.   Medications include:  Klonopin 2mg  five times daily PRN for anxiety, Seroquel 50mg  2-3tabs at bedtime, Tegretol 300mg  at bedtime, Elavil 25mg  at bedtime, Mysoline, Symbicort, Spiriva and Singulair.   She had an MRI of the brain with and without contrast performed on 10/26/03 reportedly looking for possible demyelinating disease, as she presented at that time with leg weakness.  It was normal. Prior labs from 01/23/04 revealed an elevated TSH of 6.82.  PAST MEDICAL HISTORY: Past Medical History  Diagnosis Date  . Migraine headache   . GERD (gastroesophageal reflux disease)   . Serotonin syndrome   . Dyslipidemia   .  Degenerative disk disease   . Asthma   . Bipolar 1 disorder   . Fibromyalgia   . Hiatal hernia   . Irritable bowel syndrome (IBS)   . Benign familial tremor   . Nephrolithiasis   . Allergic rhinitis   . Pneumonia 1986  . Memory loss     MEDICATIONS: Current Outpatient Prescriptions on  File Prior to Visit  Medication Sig Dispense Refill  . albuterol (PROVENTIL HFA;VENTOLIN HFA) 108 (90 BASE) MCG/ACT inhaler Inhale 2 puffs into the lungs every 6 (six) hours as needed.  1 Inhaler  5  . albuterol (PROVENTIL) (2.5 MG/3ML) 0.083% nebulizer solution Take 2.5 mg by nebulization every 6 (six) hours as needed.        [name] amitriptyline (ELAVIL) 25 MG tablet Take 25 mg by mouth at bedtime.        . budesonide-formoterol (SYMBICORT) 160-4.5 MCG/ACT inhaler Inhale 2 puffs into the lungs 2 (two) times daily.        . carbamazepine (TEGRETOL XR) 400 MG 12 hr tablet Take 400 mg by mouth at bedtime.      . cetirizine (ZYRTEC) 10 MG tablet Take 10 mg by mouth daily.      . clonazePAM (KLONOPIN) 2 MG tablet Take 2 mg by mouth daily.        [name] EPINEPHrine (EPI-PEN) 0.3 mg/0.3 mL SOAJ injection Inject 0.3 mLs (0.3 mg total) into the muscle once.  1 Device  11  . lamoTRIgine (LAMICTAL) 200 MG tablet Take 200 mg by mouth daily.       [name] levofloxacin (LEVAQUIN) 500 MG tablet Take 500 mg by mouth daily.      . montelukast (SINGULAIR) 10 MG tablet Take 10 mg by mouth at bedtime.       . predniSONE (STERAPRED UNI-PAK) 10 MG tablet Take by mouth daily.      . primidone (MYSOLINE) 50 MG tablet Take 50 mg by mouth at bedtime.        [name] SPIRIVA HANDIHALER 18 MCG inhalation capsule INHALE CONTENTS OF CAPSULE EVEYR DAY  30 capsule  5   No current facility-administered medications on file prior to visit.    ALLERGIES: Allergies  Allergen Reactions  . Cephalexin   . Cephalosporins   . Cymbalta [Duloxetine Hcl]   . Divalproex Sodium   . Effexor [Venlafaxine Hydrochloride]   . Geodon [Ziprasidone Hydrochloride]   . Hydrocodone   . Prozac [Fluoxetine Hcl]   . Theo-Dur [Theophylline]   . Thorazine [Chlorpromazine Hcl]   . Topamax     FAMILY HISTORY: Family History  Problem Relation Age of Onset  . Allergies Mother   . Clotting disorder Mother   . Allergies Brother   . Allergies Father   . Heart  disease Father   . Chorea Maternal Grandmother     SOCIAL HISTORY: History   Social History  . Marital Status: Legally Separated    Spouse Name: N/A    Number of Children: N/A  . Years of Education: N/A   Occupational History  . disabled    Social History Main Topics  . Smoking status: Former Smoker -- 0.50 packs/day for 8 years    Types: Cigarettes    Quit date: 06/09/1978  . Smokeless tobacco: Not on file  . Alcohol Use: No  . Drug Use: No  . Sexual Activity: Not on file   Other Topics Concern  . Not on file   Social History Narrative  . No narrative on file  REVIEW OF SYSTEMS: Constitutional: No fevers, chills, or sweats, no generalized fatigue, change in appetite Eyes: No visual changes, double vision, eye pain Ear, nose and throat: No hearing loss, ear pain, nasal congestion, sore throat Cardiovascular: No chest pain, palpitations Respiratory:  No shortness of breath at rest or with exertion, wheezes GastrointestinaI: No nausea, vomiting, diarrhea, abdominal pain, fecal incontinence Genitourinary:  No dysuria, urinary retention or frequency Musculoskeletal:  No neck pain, back pain Integumentary: No rash, pruritus, skin lesions Neurological: as above Psychiatric: Depression, anxiety Endocrine: No palpitations, fatigue, diaphoresis, mood swings, change in appetite, change in weight, increased thirst Hematologic/Lymphatic:  No anemia, purpura, petechiae. Allergic/Immunologic: no itchy/runny eyes, nasal congestion, recent allergic reactions, rashes  PHYSICAL EXAM: Filed Vitals:   09/01/13 0811  BP: 108/78  Pulse: 70  Temp: 98 F (36.7 C)  Resp: 18   General: No acute distress Head:  Normocephalic/atraumatic Neck: supple, no paraspinal tenderness, full range of motion Heart:  Regular rate and rhythm Lungs:  Clear to auscultation bilaterally Back: No paraspinal tenderness Neurological Exam: alert and oriented to person, place (except county), and time  (except date). Attention span and concentration intact, recent and remote memory intact, fund of knowledge intact.  Speech fluent and not dysarthric, language intact.  Able to repeat, name, repeat, follow 3 step commands and write.  Recalled 2 of 3 words.  Able to spell WORLD backwards.  Some minor inconsistencies copying intersecting pentagons.  MMSE 26/30.  CN II-XII intact. Fundoscopic exam unremarkable without vessel changes, exudates, hemorrhages or papilledema.  Bulk and tone normal, muscle strength 5/5 throughout.  Sensation to light touch, temperature and vibration intact.  Deep tendon reflexes 2+ throughout, toes downgoing.  Finger to nose and heel to shin testing intact.  Gait normal, Romberg negative.  IMPRESSION: Cognitive problems, likely related to emotional stress or polypharmacy.  Do not suspect organic etiology. Medication-overuse headache  PLAN: 1.  Discuss with Dr. Toy Care about tapering off or continue to minimize any more of the mood and anxiolytic medications if possible. 2.  Limit use of ibuprofen to no more than 1 to 2 days out of the month. 3.  Repeat neuropsychological testing in one year with follow up soon after.  Follow up sooner if needed.  15 minutes spent with patient, over 50% spent counseling, discussing neuropsych results and coordinating care.  Metta Clines, DO  CC:  Chucky May, MD  Jerene Bears, MD

## 2013-09-01 NOTE — Patient Instructions (Signed)
1.  The cognitive testing does not show any evidence that suggests brain damage.  It may be related to emotional stress or medication effect.  Recommend trying to taper off any of the mood and anxiety medications if possible.   2.  Limit pain relievers to no more than 1 to 2 days out of th week to reduce chance of rebound or medication-overuse headache. 3.  We will repeat the cognitive testing in one year with follow up soon after.  Call if need to be seen sooner.

## 2013-09-05 ENCOUNTER — Other Ambulatory Visit: Payer: Self-pay | Admitting: Pulmonary Disease

## 2013-09-12 ENCOUNTER — Ambulatory Visit: Payer: Medicare Other

## 2013-09-13 ENCOUNTER — Ambulatory Visit (INDEPENDENT_AMBULATORY_CARE_PROVIDER_SITE_OTHER): Payer: Medicare Other

## 2013-09-13 DIAGNOSIS — J45909 Unspecified asthma, uncomplicated: Secondary | ICD-10-CM

## 2013-09-13 MED ORDER — OMALIZUMAB 150 MG ~~LOC~~ SOLR
300.0000 mg | Freq: Once | SUBCUTANEOUS | Status: AC
  Administered 2013-09-13: 300 mg via SUBCUTANEOUS

## 2013-09-26 ENCOUNTER — Telehealth: Payer: Self-pay | Admitting: *Deleted

## 2013-09-26 NOTE — Telephone Encounter (Signed)
After a long conversation with this patient and Nancy [name] This patient is left owing the amount of $45.99 from a cpt code of 81448 that [facility]  Dr McDermont ordered she was just not clear at first why a prior auth was  not gotten by [place] after explaining to her she is fine with the denied claim of $45.99 and will take care of that .

## 2013-10-10 ENCOUNTER — Ambulatory Visit: Payer: Medicare Other | Admitting: Pulmonary Disease

## 2013-10-11 ENCOUNTER — Encounter: Payer: Self-pay | Admitting: Pulmonary Disease

## 2013-10-11 ENCOUNTER — Ambulatory Visit (INDEPENDENT_AMBULATORY_CARE_PROVIDER_SITE_OTHER): Payer: Medicare Other

## 2013-10-11 ENCOUNTER — Ambulatory Visit (INDEPENDENT_AMBULATORY_CARE_PROVIDER_SITE_OTHER): Payer: Medicare Other | Admitting: Pulmonary Disease

## 2013-10-11 VITALS — BP 110/70 | HR 96 | Ht 64.0 in | Wt 176.0 lb

## 2013-10-11 DIAGNOSIS — J4550 Severe persistent asthma, uncomplicated: Secondary | ICD-10-CM

## 2013-10-11 DIAGNOSIS — J45901 Unspecified asthma with (acute) exacerbation: Secondary | ICD-10-CM

## 2013-10-11 DIAGNOSIS — J45909 Unspecified asthma, uncomplicated: Secondary | ICD-10-CM

## 2013-10-11 DIAGNOSIS — J4551 Severe persistent asthma with (acute) exacerbation: Secondary | ICD-10-CM

## 2013-10-11 MED ORDER — PREDNISONE 10 MG PO TABS: ORAL_TABLET | ORAL | Status: DC

## 2013-10-11 NOTE — Assessment & Plan Note (Signed)
She has recurrent exacerbation.  Don't think she needs antibiotics at present.  Will give course of prednisone.  Will continue spiriva, symbicort, singulair, zyrtec and prn albuterol.  Overall I think she has improved with resumption of xolair in September 2014.

## 2013-10-11 NOTE — Progress Notes (Signed)
Chief Complaint  Patient presents with  . Asthma    Has been having to use her resuce inhaler a lot more than usual. Xolair has been working well.     History of Present Illness: Nancy [name] is a 48 y.o. female former smoker with asthma previously on xolair.  She has been using albuterol more frequently.  This helps, but she still feels tight in her chest.  She sneezed last week and had thick, yellow phlegm from her nose >> this only happened one time.  She denies nose bleeds, or sinus pressure.  She is not having much wheeze, or cough.  She is not bringing up sputum from her chest.  She uses symbicort bid, spiriva daily, singulair at night.  She is tolerating xolair.  She is using zyrtec.  She has not been on prednisone since September 2014.  Tests: Spirometry 03/05/10>>FEV1 2.43(87%), FEV1% 93 IgE 2008>>412 IgE 04/01/11>>162.1 Xolair from 11/23/07 to 03/05/10>>stopped due to insurance issues RAST 04/01/11>>Cat dander, dog dander, dust mites PFT 05/28/11>>FEV1 3.01(112%), FEV1% 78, TLC 5.64(112%), RV 2.28(135%), DLCO 99%, positive bronchodilator response. December 2012 >> resumed xolair >> stopped Summer 2014 due to finances >> resumed September 2014  She  has a past medical history of Migraine headache; GERD (gastroesophageal reflux disease); Serotonin syndrome; Dyslipidemia; Degenerative disk disease; Asthma; Bipolar 1 disorder; Fibromyalgia; Hiatal hernia; Irritable bowel syndrome (IBS); Benign familial tremor; Nephrolithiasis; Allergic rhinitis; Pneumonia (1986); and Memory loss.  She  has past surgical history that includes Laparoscopic cholecystectomy; MICRODISSECTION L5-S1 (August 13, 2000); Appendectomy; Tonsillectomy; back fusion; Ankle fracture surgery (2006); Esophageal dilation (August 06, 2004); Cardiac catheterization (November 27, 2006); Bronchoscopy; and Laser ablation of the cervix.   Current Outpatient Prescriptions on File Prior to Visit  Medication Sig  Dispense Refill  . albuterol (PROVENTIL HFA;VENTOLIN HFA) 108 (90 BASE) MCG/ACT inhaler Inhale 2 puffs into the lungs every 6 (six) hours as needed.  1 Inhaler  5  . albuterol (PROVENTIL) (2.5 MG/3ML) 0.083% nebulizer solution Take 2.5 mg by nebulization every 6 (six) hours as needed.        [name] amitriptyline (ELAVIL) 25 MG tablet Take 25 mg by mouth at bedtime.        . budesonide-formoterol (SYMBICORT) 160-4.5 MCG/ACT inhaler Inhale 2 puffs into the lungs 2 (two) times daily.        . carbamazepine (TEGRETOL XR) 400 MG 12 hr tablet Take 400 mg by mouth at bedtime.      . cetirizine (ZYRTEC) 10 MG tablet Take 10 mg by mouth daily.      . clonazePAM (KLONOPIN) 2 MG tablet Take 2 mg by mouth daily.        [name] EPINEPHrine (EPI-PEN) 0.3 mg/0.3 mL SOAJ injection Inject 0.3 mLs (0.3 mg total) into the muscle once.  1 Device  11  . lamoTRIgine (LAMICTAL) 200 MG tablet Take 200 mg by mouth daily.       . montelukast (SINGULAIR) 10 MG tablet Take 10 mg by mouth at bedtime.       . primidone (MYSOLINE) 50 MG tablet Take 50 mg by mouth at bedtime.        [name] SPIRIVA HANDIHALER 18 MCG inhalation capsule INHALE 1 CAPSULE BY MOUTH EVERY DAY  30 capsule  1   No current facility-administered medications on file prior to visit.    Allergies  Allergen Reactions  . Cephalexin   . Cephalosporins   . Cymbalta [Duloxetine Hcl]   . Divalproex Sodium   .  Effexor [Venlafaxine Hydrochloride]   . Geodon [Ziprasidone Hydrochloride]   . Hydrocodone   . Prozac [Fluoxetine Hcl]   . Theo-Dur [Theophylline]   . Thorazine [Chlorpromazine Hcl]   . Topamax     Physical Exam:  General - no distress ENT - No sinus tenderness, no oral exudate, no LAN Cardiac - s1s2 regular, no murmur Chest - decreased air movement, no wheeze Back - No focal tenderness Abd - Soft, non-tender Ext - No edema Neuro - Normal strength Skin - No rashes Psych - Normal mood, and behavior  Spirometry attempted >> unable to complete due to  coughing.  Assessment/Plan:  Chesley Mires, MD [place] [facility]/Critical [facility]/Sleep Pager:  [phone] 10/11/2013, 12:18 PM

## 2013-10-11 NOTE — Patient Instructions (Signed)
Prednisone 10 mg pills > 3 pills for 2 days, 2 pills for 2 days, 1 pill for 2 days Call if not feeling better Follow up in 4 months

## 2013-10-12 MED ORDER — OMALIZUMAB 150 MG ~~LOC~~ SOLR
300.0000 mg | Freq: Once | SUBCUTANEOUS | Status: AC
  Administered 2013-10-12: 300 mg via SUBCUTANEOUS

## 2013-10-12 NOTE — Addendum Note (Signed)
Addended by: Desmond Dike C on: 10/12/2013 12:15 PM   Modules accepted: Orders

## 2013-10-18 ENCOUNTER — Telehealth: Payer: Self-pay | Admitting: Pulmonary Disease

## 2013-10-18 NOTE — Telephone Encounter (Signed)
Nancy [name], Nancy [name]'s grant runs out in Aug.. She wants to know if it's okay to stretch out her next two shots to 5wks. Instead of 4. Please advise.

## 2013-10-18 NOTE — Telephone Encounter (Signed)
Pt was advised that this would be fine. Doesn't have the money to pay for medication not covered by grant.

## 2013-11-08 ENCOUNTER — Ambulatory Visit: Payer: Medicare Other

## 2013-11-09 ENCOUNTER — Encounter: Payer: Self-pay | Admitting: Internal Medicine

## 2013-11-14 ENCOUNTER — Encounter: Payer: Self-pay | Admitting: Adult Health

## 2013-11-14 ENCOUNTER — Ambulatory Visit (INDEPENDENT_AMBULATORY_CARE_PROVIDER_SITE_OTHER): Payer: Medicare Other | Admitting: Adult Health

## 2013-11-14 ENCOUNTER — Ambulatory Visit (INDEPENDENT_AMBULATORY_CARE_PROVIDER_SITE_OTHER)
Admission: RE | Admit: 2013-11-14 | Discharge: 2013-11-14 | Disposition: A | Discharge status: Home / Self Care | Payer: Medicare Other | Source: Ambulatory Visit | Attending: Adult Health | Admitting: Adult Health

## 2013-11-14 VITALS — BP 130/80 | HR 99 | Temp 98.3°F | Ht 64.0 in | Wt 175.2 lb

## 2013-11-14 DIAGNOSIS — J4551 Severe persistent asthma with (acute) exacerbation: Secondary | ICD-10-CM

## 2013-11-14 DIAGNOSIS — J45901 Unspecified asthma with (acute) exacerbation: Secondary | ICD-10-CM

## 2013-11-14 DIAGNOSIS — J45909 Unspecified asthma, uncomplicated: Secondary | ICD-10-CM | POA: Diagnosis not present

## 2013-11-14 MED ORDER — HYDROCODONE-HOMATROPINE 5-1.5 MG/5ML PO SYRP: 5.0000 mL | ORAL_SOLUTION | ORAL | Status: DC | PRN

## 2013-11-14 MED ORDER — METHYLPREDNISOLONE ACETATE 80 MG/ML IJ SUSP
120.0000 mg | Freq: Once | INTRAMUSCULAR | Status: AC
  Administered 2013-11-14: 120 mg via INTRAMUSCULAR

## 2013-11-14 NOTE — Patient Instructions (Signed)
Deslym 2 tsp Twice daily  For cough As needed   Hydromet 1-2 tsp every 4-6 hr As needed  For cough  May make you sleepy.  Depo Medrol 120mg  IM x 1  Chest xray today  Please contact office for sooner follow up if symptoms do not improve or worsen or seek emergency care  Follow up Dr. Halford Chessman  In 6 weeks and As needed

## 2013-11-14 NOTE — Progress Notes (Signed)
   Subjective:    Patient ID: Nancy [name], female    DOB: Apr 06, 1966, 48 y.o.   MRN: [id number]  HPI 48 yo former smoker with asthma   Tests: Spirometry 03/05/10>>FEV1 2.43(87%), FEV1% 93 IgE 2008>>412 IgE 04/01/11>>162.1 Xolair from 11/23/07 to 03/05/10>>stopped due to insurance issues RAST 04/01/11>>Cat dander, dog dander, dust mites PFT 05/28/11>>FEV1 3.01(112%), FEV1% 78, TLC 5.64(112%), RV 2.28(135%), DLCO 99%, positive bronchodilator response. December 2012 >> resumed xolair >> stopped Summer 2014 due to finances >> resumed September 2014   11/14/2013 Acute OV  Complains of  increased SOB, dry cough, tightness, wheezing, congestion, increased SABA use x1day. Denies f/c/s, hemoptysis, nausea, vomiting. Remains on symbicort and spiriva . Currently on Xolair  Coughing fits are quite bad. Using hydromet -needs refill.  No fever, discolored mucus  Or edema.  No recent abx use.     Review of Systems Constitutional:   No  weight loss, night sweats,  Fevers, chills,  +fatigue, or  lassitude.  HEENT:   No headaches,  Difficulty swallowing,  Tooth/dental problems, or  Sore throat,                No sneezing, itching, ear ache, nasal congestion, post nasal drip,   CV:  No chest pain,  Orthopnea, PND, swelling in lower extremities, anasarca, dizziness, palpitations, syncope.   GI  No heartburn, indigestion, abdominal pain, nausea, vomiting, diarrhea, change in bowel habits, loss of appetite, bloody stools.   Resp:   No chest wall deformity  Skin: no rash or lesions.  GU: no dysuria, change in color of urine, no urgency or frequency.  No flank pain, no hematuria   MS:  No joint pain or swelling.  No decreased range of motion.  No back pain.  Psych:  No change in mood or affect. No depression or anxiety.  No memory loss.         Objective:   Physical Exam GEN: A/Ox3; pleasant , NAD, barking cough   HEENT:  /AT,  EACs-clear, TMs-wnl, NOSE-clear, THROAT-clear, no  lesions, no postnasal drip or exudate noted.   NECK:  Supple w/ fair ROM; no JVD; normal carotid impulses w/o bruits; no thyromegaly or nodules palpated; no lymphadenopathy.  RESP  Trace exp wheeze , no stridor , no accessory muscle use, no dullness to percussion  CARD:  RRR, no m/r/g  , no peripheral edema, pulses intact, no cyanosis or clubbing.  GI:   Soft & nt; nml bowel sounds; no organomegaly or masses detected.  Musco: Warm bil, no deformities or joint swelling noted.   Neuro: alert, no focal deficits noted.    Skin: Warm, no lesions or rashes         Assessment & Plan:

## 2013-11-14 NOTE — Assessment & Plan Note (Signed)
Recurrent Flare  Check cxr   Plan  Deslym 2 tsp Twice daily  For cough As needed   Hydromet 1-2 tsp every 4-6 hr As needed  For cough  May make you sleepy.  Depo Medrol 120mg  IM x 1  Chest xray today  Please contact office for sooner follow up if symptoms do not improve or worsen or seek emergency care  Follow up Dr. Halford Chessman  In 6 weeks and As needed

## 2013-11-14 NOTE — Progress Notes (Signed)
Reviewed and agree with assessment/plan. 

## 2013-11-15 ENCOUNTER — Ambulatory Visit: Payer: Medicare Other

## 2013-11-15 MED ORDER — OMALIZUMAB 150 MG ~~LOC~~ SOLR
300.0000 mg | Freq: Once | SUBCUTANEOUS | Status: AC
  Administered 2013-11-15: 300 mg via SUBCUTANEOUS

## 2013-11-15 NOTE — Addendum Note (Signed)
Addended by: Lorane Gell on: 11/15/2013 08:57 AM   Modules accepted: Orders

## 2013-11-16 NOTE — Progress Notes (Signed)
Quick Note:  Called spoke with patient, advised of cxr results / recs as stated by TP. Pt verbalized her understanding and denied any questions. ______ 

## 2013-11-29 ENCOUNTER — Other Ambulatory Visit: Payer: Self-pay | Admitting: Pulmonary Disease

## 2013-12-19 ENCOUNTER — Ambulatory Visit (INDEPENDENT_AMBULATORY_CARE_PROVIDER_SITE_OTHER): Payer: Medicare Other

## 2013-12-19 DIAGNOSIS — J45909 Unspecified asthma, uncomplicated: Secondary | ICD-10-CM

## 2013-12-19 DIAGNOSIS — J4550 Severe persistent asthma, uncomplicated: Secondary | ICD-10-CM

## 2013-12-19 MED ORDER — OMALIZUMAB 150 MG ~~LOC~~ SOLR
300.0000 mg | Freq: Once | SUBCUTANEOUS | Status: AC
  Administered 2013-12-19: 300 mg via SUBCUTANEOUS

## 2013-12-28 ENCOUNTER — Ambulatory Visit: Payer: Medicare Other | Admitting: Pulmonary Disease

## 2013-12-30 ENCOUNTER — Ambulatory Visit (INDEPENDENT_AMBULATORY_CARE_PROVIDER_SITE_OTHER): Payer: Medicare Other | Admitting: Pulmonary Disease

## 2013-12-30 ENCOUNTER — Encounter: Payer: Self-pay | Admitting: Pulmonary Disease

## 2013-12-30 VITALS — BP 122/86 | HR 92 | Temp 97.0°F | Ht 64.0 in | Wt 164.0 lb

## 2013-12-30 DIAGNOSIS — J4550 Severe persistent asthma, uncomplicated: Secondary | ICD-10-CM

## 2013-12-30 DIAGNOSIS — J45909 Unspecified asthma, uncomplicated: Secondary | ICD-10-CM

## 2013-12-30 DIAGNOSIS — R0989 Other specified symptoms and signs involving the circulatory and respiratory systems: Secondary | ICD-10-CM

## 2013-12-30 DIAGNOSIS — R0600 Dyspnea, unspecified: Secondary | ICD-10-CM

## 2013-12-30 DIAGNOSIS — R0609 Other forms of dyspnea: Secondary | ICD-10-CM

## 2013-12-30 MED ORDER — PREDNISONE 2.5 MG PO TABS: 2.5000 mg | ORAL_TABLET | Freq: Every day | ORAL | Status: DC

## 2013-12-30 NOTE — Patient Instructions (Signed)
Prednisone 2.5 mg daily until next visit Will schedule CT chest and breathing test (PFT) Will arrange for cardiology referral Follow up 3 months

## 2013-12-30 NOTE — Progress Notes (Signed)
   Chief Complaint  Patient presents with  . Follow-up    Pt states she is still having chest tightness throughout day with and without activity and c/o DOE and having to use rescue inhaler x 4-7 times per day. Pt denies cough.     History of Present Illness: Nancy [name] is a 48 y.o. female former smoker with asthma previously on xolair.  She was doing better when on prednisone.  She has noticed more chest tightness.  She continues to get short of breath.  She is not having wheeze.  She is needing to use her rescue inhaler several times per day.  She is not having cough or sputum.  Her sinuses are okay.  She denies chest pain or palpitations.  She has not been having leg swelling.  Tests: Spirometry 03/05/10>>FEV1 2.43(87%), FEV1% 93 IgE 2008>>412 IgE 04/01/11>>162.1 Xolair from 11/23/07 to 03/05/10>>stopped due to insurance issues RAST 04/01/11>>Cat dander, dog dander, dust mites PFT 05/28/11>>FEV1 3.01(112%), FEV1% 78, TLC 5.64(112%), RV 2.28(135%), DLCO 99%, positive bronchodilator response. December 2012 >> resumed xolair >> stopped Summer 2014 due to finances >> resumed September 2014  PMHx, PSHx, Medications, Allergies, Fhx, Shx reviewed.  Physical Exam:  General - no distress ENT - No sinus tenderness, no oral exudate, no LAN Cardiac - s1s2 regular, no murmur Chest - decreased air movement, no wheeze Back - No focal tenderness Abd - Soft, non-tender Ext - No edema Neuro - Normal strength Skin - No rashes Psych - Normal mood, and behavior   Assessment/Plan:  Nancy Mires, MD [place] [facility]/Critical Care/Sleep Pager:  [phone] 12/30/2013, 3:25 PM

## 2014-01-02 ENCOUNTER — Ambulatory Visit (INDEPENDENT_AMBULATORY_CARE_PROVIDER_SITE_OTHER)
Admission: RE | Admit: 2014-01-02 | Discharge: 2014-01-02 | Disposition: A | Discharge status: Home / Self Care | Payer: Medicare Other | Source: Ambulatory Visit | Attending: Pulmonary Disease | Admitting: Pulmonary Disease

## 2014-01-02 ENCOUNTER — Telehealth: Payer: Self-pay | Admitting: Pulmonary Disease

## 2014-01-02 DIAGNOSIS — R0600 Dyspnea, unspecified: Secondary | ICD-10-CM | POA: Insufficient documentation

## 2014-01-02 DIAGNOSIS — R0989 Other specified symptoms and signs involving the circulatory and respiratory systems: Secondary | ICD-10-CM

## 2014-01-02 DIAGNOSIS — R0609 Other forms of dyspnea: Secondary | ICD-10-CM

## 2014-01-02 MED ORDER — IOHEXOL 300 MG/ML  SOLN
80.0000 mL | Freq: Once | INTRAMUSCULAR | Status: AC | PRN
  Administered 2014-01-02: 80 mL via INTRAVENOUS

## 2014-01-02 NOTE — Assessment & Plan Note (Addendum)
She is on aggressive asthma regimen.  She has persistent symptoms in spite of reported compliance with therapy.  Will get CT chest and repeat PFT's to further evaluate, and arrange for cardiology consult to exclude cardiac disease contributing to her dypsnea.

## 2014-01-02 NOTE — Assessment & Plan Note (Signed)
She has improved since resuming xolair in September 2014.  She still has persistent symptoms.  Will start her on low dose prednisone and continue this while she is continuing with xolair.  She is to continue singulair, spiriva, and symbicort.

## 2014-01-02 NOTE — Telephone Encounter (Signed)
Ct Chest W Contrast  01/02/2014   CLINICAL DATA:  Progressive shortness of breath.  Cough.  EXAM: CT CHEST WITH CONTRAST  TECHNIQUE: Multidetector CT imaging of the chest was performed during intravenous contrast administration.  CONTRAST:  6mL OMNIPAQUE IOHEXOL 300 MG/ML  SOLN  COMPARISON:  None.  FINDINGS: No pathologically enlarged mediastinal, hilar or axillary lymph nodes. Heart size normal. No pericardial effusion.  Biapical pleural parenchymal scarring. Clustered peribronchovascular nodularity in the lingula (series 3, image 36), likely post infectious in etiology. Linear scarring in the right lower lobe. Lungs are otherwise clear. No pleural fluid. Airway is unremarkable.  Incidental imaging of the upper abdomen shows the visualized portions of the liver, adrenal glands, kidneys, spleen, pancreas, stomach and bowel to be grossly unremarkable. No upper abdominal adenopathy. No worrisome lytic or sclerotic lesions.  IMPRESSION: No findings to explain the patient's shortness of breath and cough.   Electronically Signed   By: Lorin Picket M.D.   On: 01/02/2014 11:17    Will have my nurse inform pt that CT chest looks okay.  No change to current treatment plan.

## 2014-01-04 NOTE — Telephone Encounter (Signed)
Results have been explained to patient, pt expressed understanding. Nothing further needed.  

## 2014-01-17 ENCOUNTER — Ambulatory Visit (INDEPENDENT_AMBULATORY_CARE_PROVIDER_SITE_OTHER): Payer: Medicare Other | Admitting: Pulmonary Disease

## 2014-01-17 DIAGNOSIS — R0600 Dyspnea, unspecified: Secondary | ICD-10-CM

## 2014-01-17 DIAGNOSIS — R0609 Other forms of dyspnea: Secondary | ICD-10-CM

## 2014-01-17 DIAGNOSIS — R0989 Other specified symptoms and signs involving the circulatory and respiratory systems: Secondary | ICD-10-CM

## 2014-01-17 NOTE — Progress Notes (Signed)
PFT done today. 

## 2014-01-18 ENCOUNTER — Telehealth: Payer: Self-pay | Admitting: Pulmonary Disease

## 2014-01-18 LAB — PULMONARY FUNCTION TEST
DL/VA % pred: 116 %
DL/VA: 5.47 ml/min/mmHg/L
DLCO UNC % PRED: 102 %
DLCO UNC: 23.48 ml/min/mmHg
FEF 25-75 PRE: 0.66 L/s
FEF 25-75 Post: 2.92 L/sec
FEF2575-%Change-Post: 341 %
FEF2575-%Pred-Post: 102 %
FEF2575-%Pred-Pre: 23 %
FEV1-%Change-Post: 75 %
FEV1-%Pred-Post: 81 %
FEV1-%Pred-Pre: 46 %
FEV1-Post: 2.28 L
FEV1-Pre: 1.3 L
FEV1FVC-%Change-Post: 28 %
FEV1FVC-%Pred-Pre: 74 %
FEV6-%CHANGE-POST: 35 %
FEV6-%PRED-POST: 85 %
FEV6-%Pred-Pre: 63 %
FEV6-POST: 2.91 L
FEV6-PRE: 2.15 L
FEV6FVC-%CHANGE-POST: 0 %
FEV6FVC-%PRED-POST: 101 %
FEV6FVC-%PRED-PRE: 102 %
FVC-%Change-Post: 36 %
FVC-%PRED-POST: 84 %
FVC-%PRED-PRE: 61 %
FVC-POST: 2.94 L
FVC-Pre: 2.15 L
POST FEV6/FVC RATIO: 99 %
Post FEV1/FVC ratio: 78 %
Pre FEV1/FVC ratio: 61 %
Pre FEV6/FVC Ratio: 100 %
RV % pred: 135 %
RV: 2.25 L
TLC % pred: 94 %
TLC: 4.61 L

## 2014-01-18 NOTE — Telephone Encounter (Signed)
PFT 01/17/14 >> Pre FEV1 1.30 (46%)/ FEV1% 61, Post FEV1 2.28 (81%)/FEV1% 78, TLC 4.61 (94%), DLCO 102%, + BD  Will have my nurse inform pt that PFT shows moderate asthma with good response to inhaler therapy.  She is to continue current asthma regimen.

## 2014-01-19 NOTE — Telephone Encounter (Signed)
Results have been explained to patient, pt expressed understanding. Nothing further needed.  

## 2014-01-23 ENCOUNTER — Ambulatory Visit: Payer: Medicare Other

## 2014-02-01 ENCOUNTER — Ambulatory Visit (INDEPENDENT_AMBULATORY_CARE_PROVIDER_SITE_OTHER): Payer: Medicare Other | Admitting: Cardiology

## 2014-02-01 ENCOUNTER — Encounter: Payer: Self-pay | Admitting: Cardiology

## 2014-02-01 VITALS — BP 100/72 | HR 84 | Ht 64.0 in | Wt 162.1 lb

## 2014-02-01 DIAGNOSIS — R0789 Other chest pain: Secondary | ICD-10-CM

## 2014-02-01 DIAGNOSIS — R0989 Other specified symptoms and signs involving the circulatory and respiratory systems: Secondary | ICD-10-CM

## 2014-02-01 DIAGNOSIS — R0609 Other forms of dyspnea: Secondary | ICD-10-CM

## 2014-02-01 DIAGNOSIS — R079 Chest pain, unspecified: Secondary | ICD-10-CM | POA: Insufficient documentation

## 2014-02-01 NOTE — Patient Instructions (Signed)
Your physician recommends that you continue on your current medications as directed. Please refer to the Current Medication list given to you today.  Your physician has requested that you have an echocardiogram. Echocardiography is a painless test that uses sound waves to create images of your heart. It provides your doctor with information about the size and shape of your heart and how well your heart's chambers and valves are working. This procedure takes approximately one hour. There are no restrictions for this procedure.  Your physician has requested that you have en exercise stress myoview. For further information please visit [email or web address]. Please follow instruction sheet, as given.  FOLLOW UP AS NEEDED  

## 2014-02-01 NOTE — Progress Notes (Signed)
Nancy [name] Date of Birth:  03-24-1966 [place] [address] [place], [place]  [address] [phone]        Fax   [phone]   History of Present Illness: This pleasant 48 year old woman is seen at the request of Dr. Halford Chessman evaluation of dyspnea and chest tightness.  She does not have any history of known heart problems.  She does have a history of asthma since childhood.  She has had worsening shortness of breath over the past 6 months despite being on a very aggressive regimen of pulmonary therapy.  She estimates that she has to use her albuterol inhaler 6-10 times per day. She has a history of having had an admission for chest discomfort in 2008.  At that time she was transferred from [facility] to [facility] where she had a heart catheterization.  We don't have those records available.  He states that she was told that she had a 20-30% blockage in one of her arteries in she did not require a stent.  The patient had an echocardiogram in 2008 showing normal left ventricular function and an ejection fraction of 65-70%. The patient states that she has been waking up in the middle of the leg with her heart beating very fast and hard.  These episodes last several minutes and then she is able to fall back to sleep after about 15 minutes.  He has had some chest tightness with exertion.  Occasionally it radiates up to the left shoulder.  She has told her family that at times it feels like there is an elephant sitting on her chest. History is also positive for a familial tremor.  She also takes Tegretol at bedtime to keep her mind racing so that she can get some sleep. Her family history reveals that her father is living and has heart failure coronary disease and atrial fibrillation.  Her mother is living and has no heart problems.  Current Outpatient Prescriptions  Medication Sig Dispense Refill  . albuterol (PROVENTIL HFA;VENTOLIN HFA) 108 (90 BASE)  MCG/ACT inhaler Inhale 2 puffs into the lungs every 6 (six) hours as needed.  1 Inhaler  5  . albuterol (PROVENTIL) (2.5 MG/3ML) 0.083% nebulizer solution Take 2.5 mg by nebulization every 6 (six) hours as needed.        [name] amitriptyline (ELAVIL) 25 MG tablet Take 25 mg by mouth at bedtime.        . budesonide-formoterol (SYMBICORT) 160-4.5 MCG/ACT inhaler Inhale 2 puffs into the lungs 2 (two) times daily.        . carbamazepine (TEGRETOL-XR) 100 MG 12 hr tablet Take 300 mg by mouth at bedtime.      . cetirizine (ZYRTEC) 10 MG tablet Take 10 mg by mouth daily.      . clonazePAM (KLONOPIN) 2 MG tablet Take 2 mg by mouth daily as needed.       [name] EPINEPHrine (EPI-PEN) 0.3 mg/0.3 mL SOAJ injection Inject 0.3 mLs (0.3 mg total) into the muscle once.  1 Device  11  . HYDROcodone-homatropine (HYDROMET) 5-1.5 MG/5ML syrup Take 5 mLs by mouth every 4 (four) hours as needed for cough.  240 mL  0  . lamoTRIgine (LAMICTAL) 200 MG tablet Take 200 mg by mouth daily.       . montelukast (SINGULAIR) 10 MG tablet Take 10 mg by mouth at bedtime.       [name] omalizumab (XOLAIR) 150 MG injection Inject 150 mg into  the skin every 14 (fourteen) days.      . predniSONE (DELTASONE) 2.5 MG tablet Take 1 tablet (2.5 mg total) by mouth daily with breakfast.  30 tablet  3  . primidone (MYSOLINE) 50 MG tablet Take 50 mg by mouth at bedtime.        [name] SPIRIVA HANDIHALER 18 MCG inhalation capsule INHALE 1 CAPSULE BY MOUTH EVERY DAY  30 capsule  6   No current facility-administered medications for this visit.    Allergies  Allergen Reactions  . Cephalexin   . Cephalosporins   . Cymbalta [Duloxetine Hcl]   . Divalproex Sodium   . Effexor [Venlafaxine Hydrochloride]   . Geodon [Ziprasidone Hydrochloride]   . Hydrocodone     Pt states she is able to take in liquid/hydromet form  . Prozac [Fluoxetine Hcl]   . Theo-Dur [Theophylline]   . Thorazine [Chlorpromazine Hcl]   . Topamax     Patient Active Problem List   Diagnosis  Date Noted  . Chest pain of uncertain etiology 43/32/9518  . Dyspnea 01/02/2014  . Exercise-induced asthma 07/30/2012  . Severe persistent asthma in adult without complication 84/16/6063    History  Smoking status  . Former Smoker -- 0.50 packs/day for 8 years  . Types: Cigarettes  . Quit date: 06/09/1978  Smokeless tobacco  . Not on file    History  Alcohol Use No    Family History  Problem Relation Age of Onset  . Allergies Mother   . Clotting disorder Mother   . Allergies Brother   . Allergies Father   . Heart disease Father   . Chorea Maternal Grandmother     Review of Systems: Constitutional: no fever chills diaphoresis or fatigue or change in weight.  Head and neck: no hearing loss, no epistaxis, no photophobia or visual disturbance. Respiratory: No cough, shortness of breath or wheezing. Cardiovascular: No chest pain peripheral edema, palpitations. Gastrointestinal: No abdominal distention, no abdominal pain, no change in bowel habits hematochezia or melena. Genitourinary: No dysuria, no frequency, no urgency, no nocturia. Musculoskeletal:No arthralgias, no back pain, no gait disturbance or myalgias. Neurological: No dizziness, no headaches, no numbness, no seizures, no syncope, no weakness, no tremors. Hematologic: No lymphadenopathy, no easy bruising. Psychiatric: No confusion, no hallucinations, no sleep disturbance.    Physical Exam: Filed Vitals:   02/01/14 1432  BP: 100/72  Pulse: 84  The patient appears to be in no distress.  The skin is warm and dry.  Head and neck exam reveals that the pupils are equal and reactive.  The extraocular movements are full.  There is no scleral icterus.  Mouth and pharynx are benign.  No lymphadenopathy.  No carotid bruits.  The jugular venous pressure is normal.  Thyroid is not enlarged or tender.  Chest is clear to percussion and auscultation.  No rales or rhonchi.  Expansion of the chest is symmetrical.  Heart  reveals no abnormal lift or heave.  First and second heart sounds are normal.  There is faint systolic ejection murmur at the base.  No gallop or rub.  The abdomen is soft and nontender.  Bowel sounds are normoactive.  There is no hepatosplenomegaly or mass.  There are no abdominal bruits.  Extremities reveal no phlebitis or edema.  Pedal pulses are good.  There is no cyanosis or clubbing.  Neurologic exam is normal strength and no lateralizing weakness.  No sensory deficits.  Integument reveals no rash  EKG shows normal sinus rhythm and  is within normal limits at rest  Assessment / Plan: 1. atypical chest pain 2. dyspnea out of proportion to her lung disease, rule out cardiac component. 3. severe persistent asthma in adult 4. nocturnal palpitations  Plan: We will have her return for a two-dimensional echocardiogram in for a treadmill Myoview stress test. Many thanks for the opportunity to see this pleasant woman with you.  We will keep you posted as to the results of her tests.

## 2014-02-06 ENCOUNTER — Ambulatory Visit (INDEPENDENT_AMBULATORY_CARE_PROVIDER_SITE_OTHER): Payer: Medicare Other

## 2014-02-06 ENCOUNTER — Telehealth: Payer: Self-pay | Admitting: Pulmonary Disease

## 2014-02-06 DIAGNOSIS — J45909 Unspecified asthma, uncomplicated: Secondary | ICD-10-CM

## 2014-02-06 MED ORDER — BUDESONIDE-FORMOTEROL FUMARATE 160-4.5 MCG/ACT IN AERO: 2.0000 | INHALATION_SPRAY | Freq: Two times a day (BID) | RESPIRATORY_TRACT | Status: DC

## 2014-02-06 NOTE — Telephone Encounter (Signed)
ATC PT but had bad cell reception. [facility] RX sent in

## 2014-02-07 MED ORDER — OMALIZUMAB 150 MG ~~LOC~~ SOLR
300.0000 mg | Freq: Once | SUBCUTANEOUS | Status: AC
  Administered 2014-02-07: 300 mg via SUBCUTANEOUS

## 2014-02-08 NOTE — Telephone Encounter (Signed)
Spoke with patient-she was aware of rx sent in and has since picked up Rx. Nothing more needed at this time.

## 2014-02-16 ENCOUNTER — Other Ambulatory Visit (HOSPITAL_COMMUNITY): Payer: Medicare Other

## 2014-02-16 ENCOUNTER — Encounter (HOSPITAL_COMMUNITY): Payer: Medicare Other

## 2014-02-20 ENCOUNTER — Ambulatory Visit (HOSPITAL_BASED_OUTPATIENT_CLINIC_OR_DEPARTMENT_OTHER): Payer: Medicare Other | Admitting: Radiology

## 2014-02-20 ENCOUNTER — Ambulatory Visit (HOSPITAL_COMMUNITY): Payer: Medicare Other | Attending: Internal Medicine

## 2014-02-20 VITALS — BP 129/85 | HR 82 | Ht 64.0 in | Wt 164.0 lb

## 2014-02-20 DIAGNOSIS — R0609 Other forms of dyspnea: Secondary | ICD-10-CM

## 2014-02-20 DIAGNOSIS — J45909 Unspecified asthma, uncomplicated: Secondary | ICD-10-CM | POA: Diagnosis not present

## 2014-02-20 DIAGNOSIS — R0989 Other specified symptoms and signs involving the circulatory and respiratory systems: Secondary | ICD-10-CM | POA: Insufficient documentation

## 2014-02-20 DIAGNOSIS — I9589 Other hypotension: Secondary | ICD-10-CM | POA: Diagnosis not present

## 2014-02-20 DIAGNOSIS — R079 Chest pain, unspecified: Secondary | ICD-10-CM

## 2014-02-20 DIAGNOSIS — I2510 Atherosclerotic heart disease of native coronary artery without angina pectoris: Secondary | ICD-10-CM | POA: Insufficient documentation

## 2014-02-20 DIAGNOSIS — R0789 Other chest pain: Secondary | ICD-10-CM

## 2014-02-20 MED ORDER — TECHNETIUM TC 99M SESTAMIBI GENERIC - CARDIOLITE
30.0000 | Freq: Once | INTRAVENOUS | Status: AC | PRN
  Administered 2014-02-20: 30 via INTRAVENOUS

## 2014-02-20 MED ORDER — TECHNETIUM TC 99M SESTAMIBI GENERIC - CARDIOLITE
10.0000 | Freq: Once | INTRAVENOUS | Status: AC | PRN
  Administered 2014-02-20: 10 via INTRAVENOUS

## 2014-02-20 NOTE — Progress Notes (Signed)
2D Echo completed. 02/20/2014

## 2014-02-20 NOTE — Progress Notes (Signed)
  [facility] [address] [place], [place] [address] [phone]    Cardiology Nuclear Med Study  Nancy [name] is a 48 y.o. female     MRN : [id number]     DOB: 12-15-1965  Procedure Date: 02/20/2014  Nuclear Med Background Indication for Stress Test:  Evaluation for Ischemia and Follow up CAD History:  CAD (n/o dz.), Asthma Cardiac Risk Factors: none  Symptoms:  Chest Pain (last date of chest discomfort was two days ago) and SOB   Nuclear Pre-Procedure Caffeine/Decaff Intake:  None NPO After: 8:00pm   Lungs:  clear O2 Sat: 95% on room air. IV 0.9% NS with Angio Cath:  24g  IV Site: R Hand  IV Started by:  Irven Baltimore, RN  Chest Size (in):  36 Cup Size: C  Height: 5\' 4"  (1.626 m)  Weight:  164 lb (74.39 kg)  BMI:  Body mass index is 28.14 kg/(m^2). Tech Comments:  n/a    Nuclear Med Study 1 or 2 day study: 1 day  Stress Test Type:  Stress  Reading MD: n/a  Order Authorizing Provider:  Henrene Dodge  Resting Radionuclide: Technetium 14m Sestamibi  Resting Radionuclide Dose: 11.0 mCi   Stress Radionuclide:  Technetium 54m Sestamibi  Stress Radionuclide Dose: 33.0 mCi           Stress Protocol Rest HR: 82 Stress HR: 155  Rest BP: 129/85 Stress BP: 136/72  Exercise Time (min): 10:15 METS: 12.1           Dose of Adenosine (mg):  n/a Dose of Lexiscan: n/a mg  Dose of Atropine (mg): n/a Dose of Dobutamine: n/a mcg/kg/min (at max HR)  Stress Test Technologist: Glade Lloyd, BS-ES  Nuclear Technologist:  Vedia Pereyra, CNMT     Rest Procedure:  Myocardial perfusion imaging was performed at rest 45 minutes following the intravenous administration of Technetium 86m Sestamibi. Rest ECG: NSR - Normal EKG  Stress Procedure:  The patient exercised on the treadmill utilizing the Bruce Protocol for 10:15 minutes. The patient stopped due to 3-4/10 chest discomfort which resolved in recovery.  Technetium 71m Sestamibi was injected  at peak exercise and myocardial perfusion imaging was performed after a brief delay.  Stress ECG: No significant ST segment change suggestive of ischemia.  QPS Raw Data Images:  Normal; no motion artifact; normal heart/lung ratio. Stress Images:  Normal homogeneous uptake in all areas of the myocardium. Rest Images:  Normal homogeneous uptake in all areas of the myocardium. Subtraction (SDS):  Normal Transient Ischemic Dilatation (Normal <1.22):  0.84 Lung/Heart Ratio (Normal <0.45):  0.52  Quantitative Gated Spect Images QGS EDV:  48 ml QGS ESV:  14 ml  Impression Exercise Capacity:  Excellent exercise capacity. BP Response:  Hypotensive blood pressure response. Clinical Symptoms:  3-4/10 chest discomfort ECG Impression:  No significant ST segment change suggestive of ischemia. Comparison with Prior Nuclear Study: No previous nuclear study performed  Overall Impression:  Normal stress nuclear study.  LV Ejection Fraction: 72%.  LV Wall Motion:  NL LV Function; NL Wall Motion;  Chest discomfort was noted with exercise.  Pixie Casino, MD, [facility] Board Certified in Nuclear Cardiology Attending Cardiologist [facility]

## 2014-03-01 ENCOUNTER — Encounter: Payer: Self-pay | Admitting: Pulmonary Disease

## 2014-03-01 ENCOUNTER — Ambulatory Visit (INDEPENDENT_AMBULATORY_CARE_PROVIDER_SITE_OTHER): Payer: Medicare Other | Admitting: Pulmonary Disease

## 2014-03-01 VITALS — BP 112/84 | HR 91 | Temp 97.1°F | Ht 64.5 in | Wt 164.2 lb

## 2014-03-01 DIAGNOSIS — J45909 Unspecified asthma, uncomplicated: Secondary | ICD-10-CM

## 2014-03-01 DIAGNOSIS — J4550 Severe persistent asthma, uncomplicated: Secondary | ICD-10-CM

## 2014-03-01 NOTE — Patient Instructions (Signed)
Flu shot today  Follow up in 3 months 

## 2014-03-01 NOTE — Progress Notes (Signed)
   Chief Complaint  Patient presents with  . Follow-up    Pt reports a dramatic improvement of DOE in the last 3 weeks. No complaints.     History of Present Illness: Nancy [name] is a 48 y.o. female former smoker with asthma previously on xolair.  Her breathing has been doing much better since weather changed.  She was seen by cardiology and had normal Echo and stress test.  She is not having wheeze, cough, sputum, or sinus congestion recently.  She has tolerated xolair.  She takes 2.5 mg prednisone daily.  Tests: Spirometry 03/05/10>>FEV1 2.43(87%), FEV1% 93 IgE 2008>>412 IgE 04/01/11>>162.1 Xolair from 11/23/07 to 03/05/10>>stopped due to insurance issues RAST 04/01/11>>Cat dander, dog dander, dust mites PFT 05/28/11>>FEV1 3.01(112%), FEV1% 78, TLC 5.64(112%), RV 2.28(135%), DLCO 99%, positive bronchodilator response. December 2012 >> resumed xolair >> stopped Summer 2014 due to finances >> resumed September 2014  PMHx, PSHx, Medications, Allergies, Fhx, Shx reviewed.  Physical Exam:  General - no distress ENT - No sinus tenderness, no oral exudate, no LAN Cardiac - s1s2 regular, no murmur Chest - improved air movement, no wheeze Back - No focal tenderness Abd - Soft, non-tender Ext - No edema Neuro - Normal strength Skin - No rashes Psych - Normal mood, and behavior   Assessment/Plan:  Chesley Mires, MD [place] [facility]/Critical Care/Sleep Pager:  [phone] 03/01/2014, 3:59 PM

## 2014-03-01 NOTE — Assessment & Plan Note (Signed)
Improved since starting xolair again in September 2014.  Discussed stepping down her regimen >> she is reluctant to do this at present.  Will re-assess at her next visit.  She is to continue singulair, singulair, spiriva, and daily prednisone at 2.5 mg for now.  She will get flu shot today.

## 2014-03-06 ENCOUNTER — Ambulatory Visit: Payer: Medicare Other

## 2014-03-07 ENCOUNTER — Ambulatory Visit (INDEPENDENT_AMBULATORY_CARE_PROVIDER_SITE_OTHER): Payer: Medicare Other

## 2014-03-07 DIAGNOSIS — J4550 Severe persistent asthma, uncomplicated: Secondary | ICD-10-CM

## 2014-03-07 DIAGNOSIS — J45909 Unspecified asthma, uncomplicated: Secondary | ICD-10-CM

## 2014-03-08 MED ORDER — OMALIZUMAB 150 MG ~~LOC~~ SOLR
300.0000 mg | Freq: Once | SUBCUTANEOUS | Status: AC
  Administered 2014-03-08: 300 mg via SUBCUTANEOUS

## 2014-03-08 NOTE — Progress Notes (Signed)
Xolair same day of OV with VS. Documented in that OV note.

## 2014-04-13 ENCOUNTER — Ambulatory Visit: Payer: Medicare Other

## 2014-04-19 ENCOUNTER — Ambulatory Visit: Payer: Medicare Other

## 2014-04-19 ENCOUNTER — Ambulatory Visit (INDEPENDENT_AMBULATORY_CARE_PROVIDER_SITE_OTHER): Payer: Medicare Other

## 2014-04-19 DIAGNOSIS — J4550 Severe persistent asthma, uncomplicated: Secondary | ICD-10-CM

## 2014-04-25 MED ORDER — OMALIZUMAB 150 MG ~~LOC~~ SOLR
300.0000 mg | Freq: Once | SUBCUTANEOUS | Status: AC
  Administered 2014-04-19: 300 mg via SUBCUTANEOUS

## 2014-05-06 ENCOUNTER — Other Ambulatory Visit: Payer: Self-pay | Admitting: Pulmonary Disease

## 2014-05-22 ENCOUNTER — Ambulatory Visit (INDEPENDENT_AMBULATORY_CARE_PROVIDER_SITE_OTHER): Payer: Medicare Other

## 2014-05-22 ENCOUNTER — Ambulatory Visit: Payer: Medicare Other

## 2014-05-22 DIAGNOSIS — J4520 Mild intermittent asthma, uncomplicated: Secondary | ICD-10-CM

## 2014-05-23 MED ORDER — OMALIZUMAB 150 MG ~~LOC~~ SOLR
300.0000 mg | Freq: Once | SUBCUTANEOUS | Status: AC
  Administered 2014-05-22: 300 mg via SUBCUTANEOUS

## 2014-06-19 ENCOUNTER — Ambulatory Visit (INDEPENDENT_AMBULATORY_CARE_PROVIDER_SITE_OTHER): Payer: Medicare Other

## 2014-06-19 DIAGNOSIS — J4550 Severe persistent asthma, uncomplicated: Secondary | ICD-10-CM

## 2014-06-22 MED ORDER — OMALIZUMAB 150 MG ~~LOC~~ SOLR
300.0000 mg | Freq: Once | SUBCUTANEOUS | Status: AC
  Administered 2014-06-19: 300 mg via SUBCUTANEOUS

## 2014-06-26 ENCOUNTER — Ambulatory Visit: Payer: Medicare Other | Admitting: Pulmonary Disease

## 2014-07-03 ENCOUNTER — Encounter: Payer: Self-pay | Admitting: Pulmonary Disease

## 2014-07-03 ENCOUNTER — Ambulatory Visit (INDEPENDENT_AMBULATORY_CARE_PROVIDER_SITE_OTHER): Payer: Medicare Other | Admitting: Pulmonary Disease

## 2014-07-03 VITALS — BP 104/72 | HR 105 | Ht 65.0 in | Wt 170.0 lb

## 2014-07-03 DIAGNOSIS — R058 Other specified cough: Secondary | ICD-10-CM

## 2014-07-03 DIAGNOSIS — J4550 Severe persistent asthma, uncomplicated: Secondary | ICD-10-CM

## 2014-07-03 DIAGNOSIS — R05 Cough: Secondary | ICD-10-CM

## 2014-07-03 DIAGNOSIS — J3089 Other allergic rhinitis: Secondary | ICD-10-CM

## 2014-07-03 MED ORDER — FLUTICASONE PROPIONATE 50 MCG/ACT NA SUSP: 2.0000 | Freq: Every day | NASAL | Status: DC

## 2014-07-03 NOTE — Progress Notes (Signed)
   Chief Complaint  Patient presents with  . Follow-up    Pt states that SOB and chest tightness come and goes. Pt states that when she has a flare, she has symptoms for about 1 week then it will go away.    History of Present Illness: JENILEE FRANEY is a 49 y.o. female former smoker with asthma previously on xolair.  She intermittently gets more trouble with her breathing.  She is not sure what brings this on.  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  She thinks she might be exposed to something triggering her asthma more while in [place].  She stopped taking prednisone about 1 month ago >> she didn't think prednisone at 2.5 mg dose was helping.  She will get winded and feel tight in her chest.  She is not having cough, or wheeze.  She has noticed more sinus congestion and post nasal drip for the past few weeks.  She denies fever, or sinus pain.  Tests: Spirometry 03/05/10>>FEV1 2.43(87%), FEV1% 93 IgE 2008>>412 IgE 04/01/11>>162.1 Xolair from 11/23/07 to 03/05/10>>stopped due to insurance issues RAST 04/01/11>>Cat dander, dog dander, dust mites PFT 05/28/11>>FEV1 3.01(112%), FEV1% 78, TLC 5.64(112%), RV 2.28(135%), DLCO 99%, positive bronchodilator response. December 2012 >> resumed xolair >> stopped Summer 2014 due to finances >> resumed September 2014  PMHx >> Migraine, GERD, HLD, Bipolar, Fibromyalgia, HH, IBS  PSHx, Medications, Allergies, Fhx, Shx reviewed.  Physical Exam: Blood pressure 104/72, pulse 105, height 5\' 5"  (1.651 m), weight 170 lb (77.111 kg), SpO2 94 %. Body mass index is 28.29 kg/(m^2).  General - no distress ENT - No sinus tenderness, no oral exudate, no LAN Cardiac - s1s2 regular, no murmur Chest - improved air movement, no wheeze Back - No focal tenderness Abd - Soft, non-tender Ext - No edema Neuro - Normal strength Skin - No rashes Psych - Normal mood, and  behavior   Assessment/Plan:  Asthma. Plan: - continue spiriva, singulair, symbicort, and prn albuterol - she is to continue xolair  Upper airway cough syndrome with allergic rhinitis and post nasal drip. Plan: - she is willing to try nasal steroids >> will have her use fluticasone, but she can use OTC nasacort if this is less expensive than filling a prescription - advised she could try nasal irrigation >> she is reluctant to do this - she is to continue singulair and zyrtec   Chesley Mires, MD [place] [facility]/Critical Care/Sleep Pager:  [phone] 07/03/2014, 3:55 PM

## 2014-07-03 NOTE — Patient Instructions (Signed)
Fluticasone two sprays in each nostril daily Call if not feeling better Follow up in 6 months

## 2014-07-05 ENCOUNTER — Telehealth: Payer: Self-pay | Admitting: Pulmonary Disease

## 2014-07-05 NOTE — Telephone Encounter (Signed)
Pt is stopping her Xolair injections. States that she keeps being sent to collections from the company that dispenses her medication. She can't afford for this is happen every month.  Will forward to VS as a FYI.

## 2014-07-05 NOTE — Telephone Encounter (Signed)
Will ask [facility] to look into whether she has options for financial assistance.

## 2014-07-06 NOTE — Telephone Encounter (Signed)
Nancy [name] and I have spoken about this matter and we are working on this together. Will hold in my box for further follow up.

## 2014-07-13 NOTE — Telephone Encounter (Signed)
I have spoken with Xolair reps yesterday who are helping Chantel and me with this matter. Thanks.

## 2014-07-13 NOTE — Telephone Encounter (Signed)
Nancy [name], please advise on update. Thanks.

## 2014-07-17 ENCOUNTER — Ambulatory Visit: Payer: Medicare Other

## 2014-07-18 ENCOUNTER — Encounter: Payer: Self-pay | Admitting: Neurology

## 2014-07-18 ENCOUNTER — Ambulatory Visit (INDEPENDENT_AMBULATORY_CARE_PROVIDER_SITE_OTHER): Payer: Medicare Other | Admitting: Neurology

## 2014-07-18 VITALS — BP 122/80 | HR 83 | Ht 64.0 in | Wt 169.4 lb

## 2014-07-18 DIAGNOSIS — R413 Other amnesia: Secondary | ICD-10-CM

## 2014-07-18 HISTORY — DX: Other amnesia: R41.3

## 2014-07-18 NOTE — Progress Notes (Signed)
Reason for visit: Memory disorder  Nancy [name] is a 49 y.o. female  History of present illness:  Nancy [name] is a 49 year old right-handed white female with a history of a reported memory disorder dating back to 2006. The patient went out of work in 2005 secondary to asthma and low back pain. She developed some troubles with her memory both short-term and long-term shortly thereafter. The patient indicates that she had 2 episodes of serotonin syndrome associated with her antidepressant medications, and she had severe depression around that time associated with suicidal ideation. She never required hospitalization, however. She has continued to have issues with memory, and she indicates that she has difficulty with remembering recent events, keeping up with her finances, and paying the bills. She has difficulty with administering medications and keeping up with appointments. She will misplace things about the house frequently, and she has issues with directions while driving. She denies any focal numbness or weakness of the face, arms, or legs. She denies any balance issues or difficulty controlling the bowels or the bladder. She does have migraine headaches, but she has one headache every month or 2 at this point. She indicates that the depression is under relatively good control. She is on amitriptyline for her migraines. She takes Mysoline for tremor, and she is on clonazepam for anxiety, but she has stopped this medication. She also takes 700 mg of carbamazepine at night and she is on Lamictal as well. She was seen in January 2015 by Dr. Tomi Likens, and blood work was done that included a vitamin B12 level. The patient claims that she had MRI evaluation of the brain done at [facility], I do not have the results of this, but the patient indicates that this was unremarkable. She is sent to this office for further evaluation. She has undergone neuropsychological evaluation that indicated that she  does not have true organic depression, and the memory issues may be related to underlying depression or to medication effect. The patient indicates that she had a sleep study evaluation in the past, but she did not have sleep apnea. She does give a history of chronic insomnia, she may nap some during the day.  Past Medical History  Diagnosis Date  . Migraine headache   . GERD (gastroesophageal reflux disease)   . Serotonin syndrome   . Dyslipidemia   . Degenerative disk disease   . Asthma   . Bipolar 1 disorder   . Fibromyalgia   . Hiatal hernia   . Irritable bowel syndrome (IBS)   . Benign familial tremor   . Nephrolithiasis   . Allergic rhinitis   . Pneumonia 1986  . Memory loss   . Memory disorder 07/18/2014    Past Surgical History  Procedure Laterality Date  . Laparoscopic cholecystectomy    . Microdissection l5-s1  August 13, 2000  . Appendectomy    . Tonsillectomy    . Back fusion    . Ankle fracture surgery  2006    Right  . Esophageal dilation  August 06, 2004  . Cardiac catheterization  November 27, 2006    minimal CAD  . Bronchoscopy      S. aureus from BAL  . Laser ablation of the cervix      Family History  Problem Relation Age of Onset  . Allergies Mother   . Clotting disorder Mother   . Allergies Brother   . Allergies Father   . Heart disease Father   . Chorea  Maternal Grandmother     Social history:  reports that she quit smoking about 36 years ago. Her smoking use included Cigarettes. She has a 4 pack-year smoking history. She has never used smokeless tobacco. She reports that she does not drink alcohol or use illicit drugs.  Medications:  Current Outpatient Prescriptions on File Prior to Visit  Medication Sig Dispense Refill  . albuterol (PROVENTIL HFA;VENTOLIN HFA) 108 (90 BASE) MCG/ACT inhaler Inhale 2 puffs into the lungs every 6 (six) hours as needed. 1 Inhaler 5  . albuterol (PROVENTIL) (2.5 MG/3ML) 0.083% nebulizer solution Take 2.5 mg by  nebulization every 6 (six) hours as needed.      [name] amitriptyline (ELAVIL) 25 MG tablet Take 25 mg by mouth at bedtime.      [name] atorvastatin (LIPITOR) 10 MG tablet Take 5 mg by mouth daily.    . budesonide-formoterol (SYMBICORT) 160-4.5 MCG/ACT inhaler Inhale 2 puffs into the lungs 2 (two) times daily. 1 Inhaler 3  . carbamazepine (TEGRETOL-XR) 100 MG 12 hr tablet Take 700 mg by mouth at bedtime.     . cetirizine (ZYRTEC) 10 MG tablet Take 10 mg by mouth daily.    . clonazePAM (KLONOPIN) 2 MG tablet Take 2 mg by mouth daily as needed.     [name] EPINEPHrine (EPI-PEN) 0.3 mg/0.3 mL SOAJ injection Inject 0.3 mLs (0.3 mg total) into the muscle once. 1 Device 11  . HYDROcodone-homatropine (HYDROMET) 5-1.5 MG/5ML syrup Take 5 mLs by mouth every 4 (four) hours as needed for cough. 240 mL 0  . lamoTRIgine (LAMICTAL) 200 MG tablet Take 200 mg by mouth daily.     . montelukast (SINGULAIR) 10 MG tablet Take 10 mg by mouth at bedtime.     . primidone (MYSOLINE) 50 MG tablet Take 50 mg by mouth at bedtime.      [name] SPIRIVA HANDIHALER 18 MCG inhalation capsule INHALE 1 CAPSULE BY MOUTH EVERY DAY 30 capsule 6   No current facility-administered medications on file prior to visit.      Allergies  Allergen Reactions  . Cephalexin   . Cephalosporins   . Cymbalta [Duloxetine Hcl]   . Divalproex Sodium   . Effexor [Venlafaxine Hydrochloride]   . Geodon [Ziprasidone Hydrochloride]   . Hydrocodone     Pt states she is able to take in liquid/hydromet form  . Prozac [Fluoxetine Hcl]   . Theo-Dur [Theophylline]   . Thorazine [Chlorpromazine Hcl]   . Topamax     ROS:  Out of a complete 14 system review of symptoms, the patient complains only of the following symptoms, and all other reviewed systems are negative.  Fatigue Palpitations of the heart Ringing of the ears Shortness of breath Easy bruising Flushing Achy muscles Memory loss, confusion, headache, tremor Depression, anxiety, not enough sleep,  decreased energy, racing thoughts Sleepiness, snoring  Blood pressure 122/80, pulse 83, height 5\' 4"  (1.626 m), weight 169 lb 6.4 oz (76.839 kg).  Physical Exam  General: The patient is alert and cooperative at the time of the examination. The patient is moderately obese.  Eyes: Pupils are equal, round, and reactive to light. Discs are flat bilaterally.  Neck: The neck is supple, no carotid bruits are noted.  Respiratory: The respiratory examination is clear.  Cardiovascular: The cardiovascular examination reveals a regular rate and rhythm, no obvious murmurs or rubs are noted.  Skin: Extremities are without significant edema.  Neurologic Exam  Mental status: The patient is alert and oriented x 3 at the  time of the examination. The patient has apparent normal recent and remote memory, with an apparently normal attention span and concentration ability. Mini-Mental Status Examination done today shows a total score 28/30.  Cranial nerves: Facial symmetry is present. There is good sensation of the face to pinprick and soft touch bilaterally. The strength of the facial muscles and the muscles to head turning and shoulder shrug are normal bilaterally. Speech is well enunciated, no aphasia or dysarthria is noted. Extraocular movements are full. Visual fields are full. The tongue is midline, and the patient has symmetric elevation of the soft palate. No obvious hearing deficits are noted.  Motor: The motor testing reveals 5 over 5 strength of all 4 extremities. Good symmetric motor tone is noted throughout.  Sensory: Sensory testing is intact to pinprick, soft touch, vibration sensation, and position sense on all 4 extremities. No evidence of extinction is noted.  Coordination: Cerebellar testing reveals good finger-nose-finger and heel-to-shin bilaterally.  Gait and station: Gait is normal. Tandem gait is normal. Romberg is negative. No drift is seen.  Reflexes: Deep tendon reflexes are  symmetric and normal bilaterally. Toes are downgoing bilaterally.   Assessment/Plan:  1. Depression, bipolar disorder  2. Reported memory disorder  The patient has had a reasonable workup previously with MRI evaluation of the brain, neuropsychological evaluation, sleep study, and some blood work. The patient will be sent for further blood work. She indicates that she did have a recent carbamazepine level done by Dr. Toy Care, I do not have a result of this blood work. I do think that the medications may be a factor, I would recommend coming off of the amitriptyline, and Mysoline. If the carbamazepine level is elevated or in the upper limits of the therapeutic range, this dose should be decreased. The memory issues will be followed over time, she will check back in about 8 months. I do not believe this patient has an organic dementia. We will get the MRI brain results from Seth Ward MD 07/18/2014 7:56 PM  [facility] [address] [place],  [address]  Phone [phone] Fax [phone]

## 2014-07-19 NOTE — Telephone Encounter (Signed)
Has anything been accomplished with this? Thanks.

## 2014-07-20 LAB — HIV ANTIBODY (ROUTINE TESTING W REFLEX): HIV Screen 4th Generation wRfx: NONREACTIVE

## 2014-07-20 LAB — RPR: RPR Ser Ql: NONREACTIVE

## 2014-07-20 LAB — SEDIMENTATION RATE: Sed Rate: 3 mm/hr (ref 0–32)

## 2014-07-20 NOTE — Progress Notes (Signed)
Quick Note:  Left message that blood work results were unremarkable, per Dr. Jannifer Franklin. Told to call with further questions. ______

## 2014-07-20 NOTE — Telephone Encounter (Signed)
Butch Penny can you get the MRI Brain report from [facility] for this patient.  Thank you.

## 2014-07-20 NOTE — Telephone Encounter (Signed)
-----   Message from Kathrynn Ducking, MD sent at 07/18/2014  8:02 PM EST ----- Please call [facility] and get the MRI brain report faxed to our office. Thanks.

## 2014-07-21 ENCOUNTER — Telehealth: Payer: Self-pay | Admitting: Neurology

## 2014-07-21 ENCOUNTER — Telehealth: Payer: Self-pay | Admitting: *Deleted

## 2014-07-21 NOTE — Telephone Encounter (Signed)
Another office has documented on this encounter. Will route back to [name] to follow up on.

## 2014-07-21 NOTE — Telephone Encounter (Signed)
Received Mri Brain from [facility] on patient 07/21/14.

## 2014-07-21 NOTE — Telephone Encounter (Signed)
Results of the MRI the brain from [facility] revealed that the brain study was unremarkable. This was done on 05/12/2014.

## 2014-07-26 ENCOUNTER — Ambulatory Visit: Payer: Medicare Other

## 2014-07-27 NOTE — Telephone Encounter (Signed)
What is the status of this? Thanks. 

## 2014-08-01 NOTE — Telephone Encounter (Signed)
[name] please advise on the status of pt's xolair.  Thank you.

## 2014-08-01 NOTE — Telephone Encounter (Signed)
Pt aware that Chantel and I have spoken with [employer]-aware of system updates through the company and is taking longer than usual to process claims and send payment to our billing department. She is also aware that I am working closely with [place] and [place] in getting this matter resolved and to see what can be done about her getting bills and sent to collections. Will keep patient updated as I get updates.

## 2014-08-04 ENCOUNTER — Other Ambulatory Visit: Payer: Self-pay | Admitting: Orthopaedic Surgery

## 2014-08-04 DIAGNOSIS — M5136 Other intervertebral disc degeneration, lumbar region: Secondary | ICD-10-CM

## 2014-08-04 DIAGNOSIS — M5126 Other intervertebral disc displacement, lumbar region: Secondary | ICD-10-CM

## 2014-08-04 NOTE — Telephone Encounter (Signed)
[name] please advise if any updates on this.  Thanks!

## 2014-08-08 NOTE — Telephone Encounter (Signed)
What's the status on this? 

## 2014-08-09 NOTE — Telephone Encounter (Signed)
I spoke with Anabell at [employer](billing); the updates she has given me I will relay to patient. Thanks.

## 2014-08-14 NOTE — Telephone Encounter (Signed)
[name] please advise if anything else needs to be done with this message. Thanks.

## 2014-08-15 NOTE — Telephone Encounter (Signed)
Chantel and I worked with [employer]-Chantel was to contact patient regarding the billing issue. Chantel have you taken care of this message? Thanks.

## 2014-08-16 ENCOUNTER — Ambulatory Visit
Admission: RE | Admit: 2014-08-16 | Discharge: 2014-08-16 | Disposition: A | Discharge status: Home / Self Care | Payer: Medicare Other | Source: Ambulatory Visit | Attending: Orthopaedic Surgery | Admitting: Orthopaedic Surgery

## 2014-08-16 DIAGNOSIS — M5126 Other intervertebral disc displacement, lumbar region: Secondary | ICD-10-CM

## 2014-08-16 DIAGNOSIS — M5136 Other intervertebral disc degeneration, lumbar region: Secondary | ICD-10-CM

## 2014-08-16 NOTE — Telephone Encounter (Signed)
Chan please advise on any updates about this.  Thanks!

## 2014-08-25 NOTE — Telephone Encounter (Signed)
Noted and will forward to VS so he is aware as well. Thanks.

## 2014-08-25 NOTE — Telephone Encounter (Signed)
Called pt to see if she is still wanting to take her shot and she states no we have taken care of our part but pt is tired of the billing issues associated with Xolair and being on [employer] and dealing with the billing dept.

## 2014-10-23 ENCOUNTER — Encounter: Payer: Self-pay | Admitting: Critical Care Medicine

## 2015-01-02 ENCOUNTER — Other Ambulatory Visit: Payer: Self-pay | Admitting: Orthopaedic Surgery

## 2015-01-02 DIAGNOSIS — M5412 Radiculopathy, cervical region: Secondary | ICD-10-CM

## 2015-01-13 ENCOUNTER — Ambulatory Visit
Admission: RE | Admit: 2015-01-13 | Discharge: 2015-01-13 | Disposition: A | Discharge status: Home / Self Care | Payer: Medicare Other | Source: Ambulatory Visit | Attending: Orthopaedic Surgery | Admitting: Orthopaedic Surgery

## 2015-01-13 DIAGNOSIS — M5412 Radiculopathy, cervical region: Secondary | ICD-10-CM

## 2015-01-21 ENCOUNTER — Other Ambulatory Visit: Payer: Self-pay | Admitting: Pulmonary Disease

## 2015-01-31 ENCOUNTER — Ambulatory Visit (INDEPENDENT_AMBULATORY_CARE_PROVIDER_SITE_OTHER): Payer: Medicare Other | Admitting: Pulmonary Disease

## 2015-01-31 ENCOUNTER — Encounter: Payer: Self-pay | Admitting: Pulmonary Disease

## 2015-01-31 VITALS — BP 122/82 | HR 97 | Ht 65.0 in | Wt 174.0 lb

## 2015-01-31 DIAGNOSIS — J4550 Severe persistent asthma, uncomplicated: Secondary | ICD-10-CM

## 2015-01-31 DIAGNOSIS — J3089 Other allergic rhinitis: Secondary | ICD-10-CM

## 2015-01-31 NOTE — Patient Instructions (Signed)
Call next month to set up flu shot Follow up in 6 months

## 2015-01-31 NOTE — Progress Notes (Signed)
   Chief Complaint  Patient presents with  . Follow-up    Pt c/o increased SOB and chest tightness x 1 month. Denies any wheezing or prod cough.     History of Present Illness: Nancy [name] is a 49 y.o. female former smoker with persistent asthma.  She has been off [employer] for several months >> she lost financial assistance for [employer].  She has trouble with chest tightness in hot weather >>better with inhalers.  She also gets short of breath in the morning >> improves after she uses her inhalers.  She uses spiriva in the AM.    She has been on prednisone for bursitis >> last was about 1.5 weeks ago.  Tests: Spirometry 03/05/10>>FEV1 2.43(87%), FEV1% 93 IgE 2008>>412 IgE 04/01/11>>162.1 Xolair from 11/23/07 to 03/05/10>>stopped due to insurance issues RAST 04/01/11>>Cat dander, dog dander, dust mites PFT 05/28/11>>FEV1 3.01(112%), FEV1% 78, TLC 5.64(112%), RV 2.28(135%), DLCO 99%, positive bronchodilator response. December 2012 >> resumed xolair >> stopped Summer 2014 due to finances >> resumed September 2014 January 2016 >> xolair d/c'ed due to finances  PMHx >> Migraine, GERD, HLD, Bipolar, Fibromyalgia, HH, IBS  PSHx, Medications, Allergies, Fhx, Shx reviewed.  Physical Exam: BP 122/82 mmHg  Pulse 97  Ht 5\' 5"  (1.651 m)  Wt 174 lb (78.926 kg)  BMI 28.96 kg/m2  SpO2 97%  General - no distress ENT - No sinus tenderness, no oral exudate, no LAN Cardiac - s1s2 regular, no murmur Chest - improved air movement, no wheeze Back - No focal tenderness Abd - Soft, non-tender Ext - No edema Neuro - Normal strength Skin - No rashes Psych - Normal mood, and behavior   Assessment/Plan:  Persistent asthma. Plan: - continue spiriva, singulair, symbicort, and prn albuterol - advised her to try using spiriva at night  Upper airway cough syndrome with allergic rhinitis and post nasal drip. Plan: - she is to continue singulair and zyrtec   Chesley Mires, MD [place]  [facility]/Critical [facility]/Sleep Pager:  [phone] 01/31/2015, 1:57 PM

## 2015-02-19 ENCOUNTER — Telehealth: Payer: Self-pay | Admitting: Pulmonary Disease

## 2015-02-19 NOTE — Telephone Encounter (Signed)
I called spoke with pt. Made aware we currently did not have any samples at this time. Nothing further needed

## 2015-03-20 ENCOUNTER — Telehealth: Payer: Self-pay | Admitting: Pulmonary Disease

## 2015-03-20 MED ORDER — BUDESONIDE-FORMOTEROL FUMARATE 160-4.5 MCG/ACT IN AERO: INHALATION_SPRAY | RESPIRATORY_TRACT | Status: DC

## 2015-03-20 NOTE — Telephone Encounter (Signed)
Patient requesting samples of Symbicort and Spiriva. Patient notified that we do not have any samples of Spiriva available, however, we do have Symbicort. Patient was given sample of Symbicort. Patient states she will come by office to pick up tomorrow. Nothing further needed.

## 2015-03-21 ENCOUNTER — Other Ambulatory Visit: Payer: Self-pay | Admitting: Orthopaedic Surgery

## 2015-03-21 ENCOUNTER — Ambulatory Visit (INDEPENDENT_AMBULATORY_CARE_PROVIDER_SITE_OTHER): Payer: Medicare Other | Admitting: Adult Health

## 2015-03-21 ENCOUNTER — Encounter: Payer: Self-pay | Admitting: Adult Health

## 2015-03-21 VITALS — BP 114/78 | HR 99 | Ht 65.0 in | Wt 177.0 lb

## 2015-03-21 DIAGNOSIS — R413 Other amnesia: Secondary | ICD-10-CM

## 2015-03-21 DIAGNOSIS — R0683 Snoring: Secondary | ICD-10-CM | POA: Diagnosis not present

## 2015-03-21 DIAGNOSIS — M5030 Other cervical disc degeneration, unspecified cervical region: Secondary | ICD-10-CM

## 2015-03-21 NOTE — Patient Instructions (Signed)
Refer you for repeat neuropsychological testing Refer for repeat sleep study. If your symptoms worsen or you develop new symptoms please let us know.

## 2015-03-21 NOTE — Progress Notes (Signed)
PATIENT: Nancy [name] DOB: [birth year]  REASON FOR VISIT: follow up -memory HISTORY FROM: patient  HISTORY OF PRESENT ILLNESS: Nancy [name] is a 49 year old female with a history of memory disturbance that started in 2006. She returns today for follow-up. The patient has had a very thorough workup for her memory issues. So far everything has been unremarkable. At the last visit Dr. Jannifer Franklin recommended that she stop the amitriptyline, Mysoline and decrease the carbamazepine if her levels were elevated. The patient states that she has stopped amitriptyline and the Mysoline. She also decreased her carbamazepine but she has not noticed any change in her memory. She states that she forgets to do things like adding certain ingredients ito very familiar recipes. She states that she also has some issues with long-term memory. She states that she has forgotten her daughter's birthday. She states that she is just very forgetful. She states that this all occurred after she had 2 episodes of serotonin syndrome and was hospitalized. She states that after that her memory never returned to its baseline. She has had neuropsychological testing in the past however she never followed up for repeat testing. She reports that she is also had a sleep study in 2004 2005. She does not recall where she had the sleep study nor the results of the sleep study. She does state that she snores regularly. She does have some fatigue during the day but states that her mind is usually racing and therefore she does not doze off then. The patient does have some depression and anxiety however she feels that is adequately treated at this time. The patient reports that she is also having pain in the neck and down the left arm. She states that she's had MRI of the cervical spine that shows some bulging disks. She is being followed by orthopedic. She returns today for an evaluation.  HISTORY 07/18/14 (WILLIS): Nancy [name] is a 49 year old  right-handed white female with a history of a reported memory disorder dating back to 2006. The patient went out of work in 2005 secondary to asthma and low back pain. She developed some troubles with her memory both short-term and long-term shortly thereafter. The patient indicates that she had 2 episodes of serotonin syndrome associated with her antidepressant medications, and she had severe depression around that time associated with suicidal ideation. She never required hospitalization, however. She has continued to have issues with memory, and she indicates that she has difficulty with remembering recent events, keeping up with her finances, and paying the bills. She has difficulty with administering medications and keeping up with appointments. She will misplace things about the house frequently, and she has issues with directions while driving. She denies any focal numbness or weakness of the face, arms, or legs. She denies any balance issues or difficulty controlling the bowels or the bladder. She does have migraine headaches, but she has one headache every month or 2 at this point. She indicates that the depression is under relatively good control. She is on amitriptyline for her migraines. She takes Mysoline for tremor, and she is on clonazepam for anxiety, but she has stopped this medication. She also takes 700 mg of carbamazepine at night and she is on Lamictal as well. She was seen in January 2015 by Dr. Tomi Likens, and blood work was done that included a vitamin B12 level. The patient claims that she had MRI evaluation of the brain done at [facility], I do not have the results of this,  but the patient indicates that this was unremarkable. She is sent to this office for further evaluation. She has undergone neuropsychological evaluation that indicated that she does not have true organic depression, and the memory issues may be related to underlying depression or to medication effect. The patient  indicates that she had a sleep study evaluation in the past, but she did not have sleep apnea. She does give a history of chronic insomnia, she may nap some during the day.  REVIEW OF SYSTEMS: Out of a complete 14 system review of symptoms, the patient complains only of the following symptoms, and all other reviewed systems are negative.  Fatigue, ringing in ears, shortness of breath, light sensitivity, cold intolerance, heat intolerance, environmental allergies, joint pain, back pain, aching muscles, neck pain, neck stiffness, snoring, sleep talking, moles, confusion, depression, nervous/anxious, weakness, tremors, headaches, memory loss, bruise easily, abdominal allergies ALLERGIES: Allergies  Allergen Reactions  . Cephalexin   . Cephalosporins   . Cymbalta [Duloxetine Hcl]   . Divalproex Sodium   . Effexor [Venlafaxine Hydrochloride]   . Geodon [Ziprasidone Hydrochloride]   . Hydrocodone     Pt states she is able to take in liquid/hydromet form  . Prozac [Fluoxetine Hcl]   . Theo-Dur [Theophylline]   . Thorazine [Chlorpromazine Hcl]   . Topamax     HOME MEDICATIONS: Outpatient Prescriptions Prior to Visit  Medication Sig Dispense Refill  . albuterol (PROVENTIL HFA;VENTOLIN HFA) 108 (90 BASE) MCG/ACT inhaler Inhale 2 puffs into the lungs every 6 (six) hours as needed. 1 Inhaler 5  . albuterol (PROVENTIL) (2.5 MG/3ML) 0.083% nebulizer solution Take 2.5 mg by nebulization every 6 (six) hours as needed.      . budesonide-formoterol (SYMBICORT) 160-4.5 MCG/ACT inhaler INHALE 2 PUFFS INTO THE LUNGS 2 (TWO) TIMES DAILY. 1 Inhaler 0  . buPROPion (WELLBUTRIN XL) 300 MG 24 hr tablet Take 300 mg by mouth daily.    . carbamazepine (TEGRETOL-XR) 100 MG 12 hr tablet Take 400 mg by mouth at bedtime.     . cetirizine (ZYRTEC) 10 MG tablet Take 10 mg by mouth daily.    . clonazePAM (KLONOPIN) 2 MG tablet Take 1 mg by mouth daily as needed.     . cyclobenzaprine (FLEXERIL) 10 MG tablet Take 10 mg by  mouth 2 (two) times daily as needed for muscle spasms.     [name] EPINEPHrine (EPI-PEN) 0.3 mg/0.3 mL SOAJ injection Inject 0.3 mLs (0.3 mg total) into the muscle once. 1 Device 11  . gabapentin (NEURONTIN) 300 MG capsule Take 300 mg by mouth. Take 3 tablets, 2 tablets in the evening, and 3 tablets at bedtime    . HYDROcodone-acetaminophen (NORCO) 10-325 MG per tablet Take 1 tablet by mouth 3 (three) times daily.    [name] HYDROcodone-homatropine (HYDROMET) 5-1.5 MG/5ML syrup Take 5 mLs by mouth every 4 (four) hours as needed for cough. 240 mL 0  . lamoTRIgine (LAMICTAL) 200 MG tablet Take 100 mg by mouth daily.     . montelukast (SINGULAIR) 10 MG tablet Take 10 mg by mouth at bedtime.     [name] SPIRIVA HANDIHALER 18 MCG inhalation capsule INHALE 1 CAPSULE BY MOUTH EVERY DAY 30 capsule 6   No facility-administered medications prior to visit.    PAST MEDICAL HISTORY: Past Medical History  Diagnosis Date  . Migraine headache   . GERD (gastroesophageal reflux disease)   . Serotonin syndrome   . Dyslipidemia   . Degenerative disk disease   . Asthma   .  Bipolar 1 disorder ([place])   . Fibromyalgia   . Hiatal hernia   . Irritable bowel syndrome (IBS)   . Benign familial tremor   . Nephrolithiasis   . Allergic rhinitis   . Pneumonia 1986  . Memory loss   . Memory disorder 07/18/2014    PAST SURGICAL HISTORY: Past Surgical History  Procedure Laterality Date  . Laparoscopic cholecystectomy    . Microdissection l5-s1  August 13, 2000  . Appendectomy    . Tonsillectomy    . Back fusion    . Ankle fracture surgery  2006    Right  . Esophageal dilation  August 06, 2004  . Cardiac catheterization  November 27, 2006    minimal CAD  . Bronchoscopy      S. aureus from BAL  . Laser ablation of the cervix      FAMILY HISTORY: Family History  Problem Relation Age of Onset  . Allergies Mother   . Clotting disorder Mother   . Allergies Brother   . Allergies Father   . Heart disease Father   . Chorea  Maternal Grandmother     SOCIAL HISTORY: Social History   Social History  . Marital Status: Legally Separated    Spouse Name: N/A  . Number of Children: 1  . Years of Education: HS   Occupational History  . disabled    Social History Main Topics  . Smoking status: Former Smoker -- 0.50 packs/day for 8 years    Types: Cigarettes    Quit date: 06/09/1978  . Smokeless tobacco: Never Used  . Alcohol Use: No  . Drug Use: No  . Sexual Activity: Not on file   Other Topics Concern  . Not on file   Social History Narrative   Patient is right handed.   Patient does not drink caffeine.      PHYSICAL EXAM  Filed Vitals:   03/21/15 1117  BP: 114/78  Pulse: 99  Height: 5\' 5"  (1.651 m)  Weight: 177 lb (80.287 kg)   Body mass index is 29.45 kg/(m^2).   MMSE - Mini Mental State Exam 03/21/2015 07/18/2014  Orientation to time 5 5  Orientation to Place 4 4  Registration 3 3  Attention/ Calculation 5 5  Recall 2 2  Language- name 2 objects 2 2  Language- repeat 1 1  Language- follow 3 step command 3 3  Language- read & follow direction 1 1  Write a sentence 1 1  Copy design 1 1  Total score 28 28     Generalized: Well developed, in no acute distress   Neurological examination  Mentation: Alert oriented to time, place, history taking. Follows all commands speech and language fluent Cranial nerve II-XII: Pupils were equal round reactive to light. Extraocular movements were full, visual field were full on confrontational test. Facial sensation and strength were normal. Uvula tongue midline. Head turning and shoulder shrug  were normal and symmetric. Motor: The motor testing reveals 5 over 5 strength of all 4 extremities. Good symmetric motor tone is noted throughout.  Sensory: Sensory testing is intact to soft touch on all 4 extremities. No evidence of extinction is noted.  Coordination: Cerebellar testing reveals good finger-nose-finger and heel-to-shin bilaterally.  Gait  and station: Gait is normal. Tandem gait is normal. Romberg is negative. No drift is seen.  Reflexes: Deep tendon reflexes are symmetric and normal bilaterally.   DIAGNOSTIC DATA (LABS, IMAGING, TESTING) - I reviewed patient records, labs, notes, testing and  imaging myself where available.     ASSESSMENT AND PLAN 49 y.o. year old female  has a past medical history of Migraine headache; GERD (gastroesophageal reflux disease); Serotonin syndrome; Dyslipidemia; Degenerative disk disease; Asthma; Bipolar 1 disorder ([place]); Fibromyalgia; Hiatal hernia; Irritable bowel syndrome (IBS); Benign familial tremor; Nephrolithiasis; Allergic rhinitis; Pneumonia (1986); Memory loss; and Memory disorder (07/18/2014). here with:  1. Memory disturbance  The patient has had a thorough workup in the past for her memory and everything has been unremarkable. The patient has not followed up for repeat neuropsychological tests. I will put this in today. The patient also does not recall the results of her sleep study. According to the patient her sleep study was almost 10 years ago. She is amenable to having another sleep study if necessary. I will put in a referral today. Patient advised that if her symptoms worsen or she develops any new symptoms she she'll let us know. She will follow-up in 4-5 months or sooner if needed.     Ward Givens, MSN, NP-C 03/21/2015, 12:14 PM [facility] [address] [place], [place] [address] [phone]

## 2015-03-21 NOTE — Progress Notes (Signed)
I have read the note, and I agree with the clinical assessment and plan.  Trena Dunavan KEITH   

## 2015-03-22 ENCOUNTER — Ambulatory Visit
Admission: RE | Admit: 2015-03-22 | Discharge: 2015-03-22 | Disposition: A | Discharge status: Home / Self Care | Payer: Medicare Other | Source: Ambulatory Visit | Attending: Orthopaedic Surgery | Admitting: Orthopaedic Surgery

## 2015-03-22 DIAGNOSIS — M5030 Other cervical disc degeneration, unspecified cervical region: Secondary | ICD-10-CM

## 2015-06-13 DIAGNOSIS — R51 Headache: Secondary | ICD-10-CM | POA: Diagnosis not present

## 2015-06-13 DIAGNOSIS — E785 Hyperlipidemia, unspecified: Secondary | ICD-10-CM | POA: Diagnosis not present

## 2015-06-13 DIAGNOSIS — R109 Unspecified abdominal pain: Secondary | ICD-10-CM | POA: Diagnosis not present

## 2015-06-13 DIAGNOSIS — Z888 Allergy status to other drugs, medicaments and biological substances status: Secondary | ICD-10-CM | POA: Diagnosis not present

## 2015-06-13 DIAGNOSIS — I10 Essential (primary) hypertension: Secondary | ICD-10-CM | POA: Diagnosis not present

## 2015-06-13 DIAGNOSIS — R1013 Epigastric pain: Secondary | ICD-10-CM | POA: Diagnosis not present

## 2015-06-13 DIAGNOSIS — K219 Gastro-esophageal reflux disease without esophagitis: Secondary | ICD-10-CM | POA: Diagnosis not present

## 2015-06-13 DIAGNOSIS — R1084 Generalized abdominal pain: Secondary | ICD-10-CM | POA: Diagnosis not present

## 2015-06-13 DIAGNOSIS — J45909 Unspecified asthma, uncomplicated: Secondary | ICD-10-CM | POA: Diagnosis not present

## 2015-06-13 DIAGNOSIS — Z87442 Personal history of urinary calculi: Secondary | ICD-10-CM | POA: Diagnosis not present

## 2015-06-13 DIAGNOSIS — M549 Dorsalgia, unspecified: Secondary | ICD-10-CM | POA: Diagnosis not present

## 2015-06-14 DIAGNOSIS — I10 Essential (primary) hypertension: Secondary | ICD-10-CM | POA: Diagnosis not present

## 2015-06-14 DIAGNOSIS — R109 Unspecified abdominal pain: Secondary | ICD-10-CM | POA: Diagnosis not present

## 2015-06-18 DIAGNOSIS — G894 Chronic pain syndrome: Secondary | ICD-10-CM | POA: Diagnosis not present

## 2015-06-18 DIAGNOSIS — M4722 Other spondylosis with radiculopathy, cervical region: Secondary | ICD-10-CM | POA: Diagnosis not present

## 2015-06-18 DIAGNOSIS — M5106 Intervertebral disc disorders with myelopathy, lumbar region: Secondary | ICD-10-CM | POA: Diagnosis not present

## 2015-06-22 DIAGNOSIS — Z418 Encounter for other procedures for purposes other than remedying health state: Secondary | ICD-10-CM | POA: Diagnosis not present

## 2015-06-22 DIAGNOSIS — R109 Unspecified abdominal pain: Secondary | ICD-10-CM | POA: Diagnosis not present

## 2015-06-22 DIAGNOSIS — Z09 Encounter for follow-up examination after completed treatment for conditions other than malignant neoplasm: Secondary | ICD-10-CM | POA: Diagnosis not present

## 2015-06-22 DIAGNOSIS — K219 Gastro-esophageal reflux disease without esophagitis: Secondary | ICD-10-CM | POA: Diagnosis not present

## 2015-06-25 ENCOUNTER — Telehealth: Payer: Self-pay | Admitting: Adult Health

## 2015-06-25 DIAGNOSIS — F3131 Bipolar disorder, current episode depressed, mild: Secondary | ICD-10-CM | POA: Diagnosis not present

## 2015-06-25 DIAGNOSIS — F3111 Bipolar disorder, current episode manic without psychotic features, mild: Secondary | ICD-10-CM | POA: Diagnosis not present

## 2015-06-25 NOTE — Telephone Encounter (Signed)
I called the patient. I reviewed her neuropsychological testing results with her. The patient has a copy of the results and she has read through the recommendations as well. It appears that the patient does not have an organic component to her memory loss. It was felt that she had an unspecified cognitive disorder secondary to mood and polypharmacy as well as bipolar disorder. It was also not ruled out that sleep disruption could be a likely etiology as well. The patient has a sleep evaluation on the 23rd and follows up with me in February. Patient advised that if her symptoms worsen or she develops any new symptoms she should let us know. Patient verbalized understanding.

## 2015-07-02 ENCOUNTER — Institutional Professional Consult (permissible substitution): Payer: Medicare Other | Admitting: Neurology

## 2015-07-04 DIAGNOSIS — R928 Other abnormal and inconclusive findings on diagnostic imaging of breast: Secondary | ICD-10-CM | POA: Diagnosis not present

## 2015-07-23 DIAGNOSIS — G894 Chronic pain syndrome: Secondary | ICD-10-CM | POA: Diagnosis not present

## 2015-07-30 ENCOUNTER — Institutional Professional Consult (permissible substitution): Payer: Medicare Other | Admitting: Neurology

## 2015-07-31 ENCOUNTER — Ambulatory Visit: Payer: Medicare Other | Admitting: Adult Health

## 2015-08-07 ENCOUNTER — Encounter: Payer: Self-pay | Admitting: Pulmonary Disease

## 2015-08-07 ENCOUNTER — Ambulatory Visit (INDEPENDENT_AMBULATORY_CARE_PROVIDER_SITE_OTHER): Payer: PPO | Admitting: Pulmonary Disease

## 2015-08-07 VITALS — BP 102/58 | HR 96 | Ht 65.0 in | Wt 163.4 lb

## 2015-08-07 DIAGNOSIS — J4550 Severe persistent asthma, uncomplicated: Secondary | ICD-10-CM | POA: Diagnosis not present

## 2015-08-07 DIAGNOSIS — R05 Cough: Secondary | ICD-10-CM | POA: Diagnosis not present

## 2015-08-07 DIAGNOSIS — J309 Allergic rhinitis, unspecified: Secondary | ICD-10-CM | POA: Diagnosis not present

## 2015-08-07 DIAGNOSIS — R058 Other specified cough: Secondary | ICD-10-CM

## 2015-08-07 NOTE — Progress Notes (Signed)
   Chief Complaint  Patient presents with  . Follow-up    Pt reports frequent flares since Dec 2016. Pt thinks d/t weather. Pt denies any current symptoms.    Tests: Spirometry 03/05/10>>FEV1 2.43(87%), FEV1% 93 IgE 2008>>412 IgE 04/01/11>>162.1 Xolair from 11/23/07 to 03/05/10>>stopped due to insurance issues RAST 04/01/11>>Cat dander, dog dander, dust mites PFT 05/28/11>>FEV1 3.01(112%), FEV1% 78, TLC 5.64(112%), RV 2.28(135%), DLCO 99%, positive bronchodilator response. December 2012 >> resumed xolair >> stopped Summer 2014 due to finances >> resumed September 2014 January 2016 >> xolair d/c'ed due to finances  Past medical history Migraine, GERD, HLD, Bipolar, Fibromyalgia, HH, IBS  PSHx, Medications, Allergies, Fhx, Shx reviewed.  Vital signs BP 102/58 mmHg  Pulse 96  Ht 5\' 5"  (1.651 m)  Wt 163 lb 6.4 oz (74.118 kg)  BMI 27.19 kg/m2  SpO2 95%  History of Present Illness: Nancy [name] is a 50 y.o. female former smoker with persistent asthma.  She had a rough winter.  She didn't need steroids, but had to use her albuterol frequently.  She feels her symptoms recently have been in her chest.  Her sinuses have been okay.  She gets occasional cough, wheeze, and chest tightness.  This is most noticeable with rapid changes in the weather.  She denies fever, rashes, or swelling.  Physical Exam:  General - no distress ENT - No sinus tenderness, no oral exudate, no LAN Cardiac - s1s2 regular, no murmur Chest - scattered rhonchi, no wheeze Back - No focal tenderness Abd - Soft, non-tender Ext - No edema Neuro - Normal strength Skin - No rashes Psych - Normal mood, and behavior   Assessment/Plan:  Persistent asthma. Plan: - continue spiriva, singulair, symbicort, and prn albuterol - she would be a good candidate for biologic agent >> she is concerned about financial implications of being on this type of therapy, and would not want to try this at this time  Upper  airway cough syndrome with allergic rhinitis and post nasal drip. Plan: - she is to continue singulair and zyrtec   Patient Instructions  Follow up in 6 months    Chesley Mires, MD [facility]/Sleep Pager:  [phone] 08/07/2015, 4:18 PM

## 2015-08-07 NOTE — Patient Instructions (Signed)
Follow up in 6 months 

## 2015-08-14 DIAGNOSIS — Z01419 Encounter for gynecological examination (general) (routine) without abnormal findings: Secondary | ICD-10-CM | POA: Diagnosis not present

## 2015-08-14 DIAGNOSIS — Z6827 Body mass index (BMI) 27.0-27.9, adult: Secondary | ICD-10-CM | POA: Diagnosis not present

## 2015-08-20 DIAGNOSIS — G894 Chronic pain syndrome: Secondary | ICD-10-CM | POA: Diagnosis not present

## 2015-08-20 DIAGNOSIS — Z87891 Personal history of nicotine dependence: Secondary | ICD-10-CM | POA: Diagnosis not present

## 2015-08-20 DIAGNOSIS — E7800 Pure hypercholesterolemia, unspecified: Secondary | ICD-10-CM | POA: Diagnosis not present

## 2015-08-20 DIAGNOSIS — L259 Unspecified contact dermatitis, unspecified cause: Secondary | ICD-10-CM | POA: Diagnosis not present

## 2015-08-20 DIAGNOSIS — Z299 Encounter for prophylactic measures, unspecified: Secondary | ICD-10-CM | POA: Diagnosis not present

## 2015-08-20 DIAGNOSIS — M5106 Intervertebral disc disorders with myelopathy, lumbar region: Secondary | ICD-10-CM | POA: Diagnosis not present

## 2015-08-20 DIAGNOSIS — M5013 Cervical disc disorder with radiculopathy, cervicothoracic region: Secondary | ICD-10-CM | POA: Diagnosis not present

## 2015-08-31 DIAGNOSIS — F3111 Bipolar disorder, current episode manic without psychotic features, mild: Secondary | ICD-10-CM | POA: Diagnosis not present

## 2015-08-31 DIAGNOSIS — E538 Deficiency of other specified B group vitamins: Secondary | ICD-10-CM | POA: Diagnosis not present

## 2015-08-31 DIAGNOSIS — Z299 Encounter for prophylactic measures, unspecified: Secondary | ICD-10-CM | POA: Diagnosis not present

## 2015-08-31 DIAGNOSIS — Z1389 Encounter for screening for other disorder: Secondary | ICD-10-CM | POA: Diagnosis not present

## 2015-08-31 DIAGNOSIS — E7800 Pure hypercholesterolemia, unspecified: Secondary | ICD-10-CM | POA: Diagnosis not present

## 2015-08-31 DIAGNOSIS — Z79899 Other long term (current) drug therapy: Secondary | ICD-10-CM | POA: Diagnosis not present

## 2015-08-31 DIAGNOSIS — Z0000 Encounter for general adult medical examination without abnormal findings: Secondary | ICD-10-CM | POA: Diagnosis not present

## 2015-08-31 DIAGNOSIS — R5383 Other fatigue: Secondary | ICD-10-CM | POA: Diagnosis not present

## 2015-08-31 DIAGNOSIS — Z7189 Other specified counseling: Secondary | ICD-10-CM | POA: Diagnosis not present

## 2015-09-27 DIAGNOSIS — L298 Other pruritus: Secondary | ICD-10-CM | POA: Diagnosis not present

## 2015-10-01 DIAGNOSIS — M545 Low back pain: Secondary | ICD-10-CM | POA: Diagnosis not present

## 2015-10-01 DIAGNOSIS — G894 Chronic pain syndrome: Secondary | ICD-10-CM | POA: Diagnosis not present

## 2015-10-01 DIAGNOSIS — M4722 Other spondylosis with radiculopathy, cervical region: Secondary | ICD-10-CM | POA: Diagnosis not present

## 2015-10-30 DIAGNOSIS — M4722 Other spondylosis with radiculopathy, cervical region: Secondary | ICD-10-CM | POA: Diagnosis not present

## 2015-10-30 DIAGNOSIS — M5013 Cervical disc disorder with radiculopathy, cervicothoracic region: Secondary | ICD-10-CM | POA: Diagnosis not present

## 2015-10-30 DIAGNOSIS — Z79891 Long term (current) use of opiate analgesic: Secondary | ICD-10-CM | POA: Diagnosis not present

## 2015-10-30 DIAGNOSIS — G894 Chronic pain syndrome: Secondary | ICD-10-CM | POA: Diagnosis not present

## 2015-10-30 DIAGNOSIS — M5106 Intervertebral disc disorders with myelopathy, lumbar region: Secondary | ICD-10-CM | POA: Diagnosis not present

## 2015-10-30 DIAGNOSIS — Z79899 Other long term (current) drug therapy: Secondary | ICD-10-CM | POA: Diagnosis not present

## 2015-11-08 ENCOUNTER — Other Ambulatory Visit: Payer: Self-pay | Admitting: Rehabilitation

## 2015-11-08 DIAGNOSIS — M5106 Intervertebral disc disorders with myelopathy, lumbar region: Secondary | ICD-10-CM

## 2015-11-17 ENCOUNTER — Ambulatory Visit
Admission: RE | Admit: 2015-11-17 | Discharge: 2015-11-17 | Disposition: A | Discharge status: Home / Self Care | Payer: PPO | Source: Ambulatory Visit | Attending: Rehabilitation | Admitting: Rehabilitation

## 2015-11-17 DIAGNOSIS — M5126 Other intervertebral disc displacement, lumbar region: Secondary | ICD-10-CM | POA: Diagnosis not present

## 2015-11-17 DIAGNOSIS — M5106 Intervertebral disc disorders with myelopathy, lumbar region: Secondary | ICD-10-CM

## 2015-11-19 DIAGNOSIS — M545 Low back pain: Secondary | ICD-10-CM | POA: Diagnosis not present

## 2015-11-19 DIAGNOSIS — Z87891 Personal history of nicotine dependence: Secondary | ICD-10-CM | POA: Diagnosis not present

## 2015-11-19 DIAGNOSIS — J45909 Unspecified asthma, uncomplicated: Secondary | ICD-10-CM | POA: Diagnosis not present

## 2015-11-19 DIAGNOSIS — M1990 Unspecified osteoarthritis, unspecified site: Secondary | ICD-10-CM | POA: Diagnosis not present

## 2015-12-12 DIAGNOSIS — M5013 Cervical disc disorder with radiculopathy, cervicothoracic region: Secondary | ICD-10-CM | POA: Diagnosis not present

## 2015-12-12 DIAGNOSIS — M5126 Other intervertebral disc displacement, lumbar region: Secondary | ICD-10-CM | POA: Diagnosis not present

## 2015-12-17 DIAGNOSIS — F3174 Bipolar disorder, in full remission, most recent episode manic: Secondary | ICD-10-CM | POA: Diagnosis not present

## 2015-12-17 DIAGNOSIS — F410 Panic disorder [episodic paroxysmal anxiety] without agoraphobia: Secondary | ICD-10-CM | POA: Diagnosis not present

## 2015-12-17 DIAGNOSIS — F3131 Bipolar disorder, current episode depressed, mild: Secondary | ICD-10-CM | POA: Diagnosis not present

## 2015-12-25 DIAGNOSIS — M5126 Other intervertebral disc displacement, lumbar region: Secondary | ICD-10-CM | POA: Diagnosis not present

## 2016-01-22 DIAGNOSIS — M5126 Other intervertebral disc displacement, lumbar region: Secondary | ICD-10-CM | POA: Diagnosis not present

## 2016-01-25 DIAGNOSIS — J45909 Unspecified asthma, uncomplicated: Secondary | ICD-10-CM | POA: Diagnosis not present

## 2016-01-25 DIAGNOSIS — Z299 Encounter for prophylactic measures, unspecified: Secondary | ICD-10-CM | POA: Diagnosis not present

## 2016-01-25 DIAGNOSIS — M5416 Radiculopathy, lumbar region: Secondary | ICD-10-CM | POA: Diagnosis not present

## 2016-01-25 DIAGNOSIS — K589 Irritable bowel syndrome without diarrhea: Secondary | ICD-10-CM | POA: Diagnosis not present

## 2016-01-25 DIAGNOSIS — M25551 Pain in right hip: Secondary | ICD-10-CM | POA: Diagnosis not present

## 2016-02-04 ENCOUNTER — Ambulatory Visit (INDEPENDENT_AMBULATORY_CARE_PROVIDER_SITE_OTHER): Payer: PPO | Admitting: Pulmonary Disease

## 2016-02-04 ENCOUNTER — Encounter: Payer: Self-pay | Admitting: Pulmonary Disease

## 2016-02-04 VITALS — BP 114/68 | HR 103 | Ht 65.0 in | Wt 157.2 lb

## 2016-02-04 DIAGNOSIS — R05 Cough: Secondary | ICD-10-CM

## 2016-02-04 DIAGNOSIS — Z23 Encounter for immunization: Secondary | ICD-10-CM | POA: Diagnosis not present

## 2016-02-04 DIAGNOSIS — J4550 Severe persistent asthma, uncomplicated: Secondary | ICD-10-CM | POA: Diagnosis not present

## 2016-02-04 DIAGNOSIS — R058 Other specified cough: Secondary | ICD-10-CM

## 2016-02-04 NOTE — Progress Notes (Signed)
Current Outpatient Prescriptions on File Prior to Visit  Medication Sig  . albuterol (PROVENTIL HFA;VENTOLIN HFA) 108 (90 BASE) MCG/ACT inhaler Inhale 2 puffs into the lungs every 6 (six) hours as needed.  [name] albuterol (PROVENTIL) (2.5 MG/3ML) 0.083% nebulizer solution Take 2.5 mg by nebulization every 6 (six) hours as needed.    . budesonide-formoterol (SYMBICORT) 160-4.5 MCG/ACT inhaler INHALE 2 PUFFS INTO THE LUNGS 2 (TWO) TIMES DAILY.  [name] buPROPion (WELLBUTRIN XL) 300 MG 24 hr tablet Take 400 mg by mouth daily.   . carbamazepine (TEGRETOL-XR) 100 MG 12 hr tablet Take 300 mg by mouth at bedtime.   . cetirizine (ZYRTEC) 10 MG tablet Take 10 mg by mouth daily.  . cyclobenzaprine (FLEXERIL) 10 MG tablet Take 10 mg by mouth 2 (two) times daily as needed for muscle spasms.   [name] EPINEPHrine (EPI-PEN) 0.3 mg/0.3 mL SOAJ injection Inject 0.3 mLs (0.3 mg total) into the muscle once.  . gabapentin (NEURONTIN) 300 MG capsule Take 3 tablets in AM and 3 tablets at bedtime  . HYDROcodone-acetaminophen (NORCO) 10-325 MG per tablet Take 1 tablet by mouth 3 (three) times daily.  [name] HYDROcodone-homatropine (HYDROMET) 5-1.5 MG/5ML syrup Take 5 mLs by mouth every 4 (four) hours as needed for cough.  . lamoTRIgine (LAMICTAL) 200 MG tablet Take 100 mg by mouth daily.   . montelukast (SINGULAIR) 10 MG tablet Take 10 mg by mouth at bedtime.   [name] SPIRIVA HANDIHALER 18 MCG inhalation capsule INHALE 1 CAPSULE BY MOUTH EVERY DAY   No current facility-administered medications on file prior to visit.     Chief Complaint  Patient presents with  . Follow-up    Pt reports breathing is been fair. C/o chest tightness, has very little dry cough.     Pulmonary tests Spirometry 03/05/10>>FEV1 2.43(87%), FEV1% 93 IgE 2008>>412 IgE 04/01/11>>162.1 Xolair from 11/23/07 to 03/05/10>>stopped due to insurance issues RAST 04/01/11>>Cat dander, dog dander, dust mites PFT 05/28/11>>FEV1 3.01(112%), FEV1% 78, TLC 5.64(112%), RV  2.28(135%), DLCO 99%, positive bronchodilator response. December 2012 >> resumed xolair >> stopped Summer 2014 due to finances >> resumed September 2014 January 2016 >> xolair d/c'ed due to finances  Past medical history Migraine, GERD, HLD, Bipolar, Fibromyalgia, HH, IBS  Past surgical history, Family history, Social history, Allergies reviewed.  Vital signs BP 114/68 (BP Location: Left Arm, Cuff Size: Normal)   Pulse (!) 103   Ht 5\' 5"  (1.651 m)   Wt 157 lb 3.2 oz (71.3 kg)   SpO2 97%   BMI 26.16 kg/m   History of Present Illness: Nancy [name] is a 50 y.o. female former smoker with persistent asthma.  She was on prednisone recently for her back.  She is followed by Dr. Patrice Paradise.  She might need to have back surgery again >> she has been putting this off.  She gets intermittent chest tightness >> uses albuterol and feels better.  She is not having fever, sputum, chest pain, skin rash.  Physical Exam:  General - no distress ENT - No sinus tenderness, no oral exudate, no LAN Cardiac - s1s2 regular, no murmur Chest - scattered rhonchi, no wheeze Back - No focal tenderness Abd - Soft, non-tender Ext - No edema Neuro - Normal strength Skin - No rashes Psych - Normal mood, and behavior   Assessment/Plan:  Persistent asthma. - continue spiriva, singulair, symbicort, and prn albuterol - she would be a good candidate for biologic agent >> she is concerned about financial implications of being on this type  of therapy, and would not want to try this at this time - flu shot today  Upper airway cough syndrome with allergic rhinitis and post nasal drip. - she is to continue singulair and zyrtec  Low back pain with bulging disk. - advised her to contact our office if she needs pre-operative pulmonary assessment in the event she decides to have surgery   Patient Instructions  Flu shot today  Follow up in 6 months   Chesley Mires, MD [place] [facility]Critical  Care/Sleep Pager:  [phone] 02/04/2016, 11:59 AM

## 2016-02-04 NOTE — Patient Instructions (Signed)
Flu shot today Follow up in 6 months 

## 2016-02-18 DIAGNOSIS — M5126 Other intervertebral disc displacement, lumbar region: Secondary | ICD-10-CM | POA: Diagnosis not present

## 2016-03-12 DIAGNOSIS — R070 Pain in throat: Secondary | ICD-10-CM | POA: Diagnosis not present

## 2016-03-12 DIAGNOSIS — R0981 Nasal congestion: Secondary | ICD-10-CM | POA: Diagnosis not present

## 2016-03-12 DIAGNOSIS — J45909 Unspecified asthma, uncomplicated: Secondary | ICD-10-CM | POA: Diagnosis not present

## 2016-04-03 DIAGNOSIS — M47816 Spondylosis without myelopathy or radiculopathy, lumbar region: Secondary | ICD-10-CM | POA: Diagnosis not present

## 2016-04-03 DIAGNOSIS — M5126 Other intervertebral disc displacement, lumbar region: Secondary | ICD-10-CM | POA: Diagnosis not present

## 2016-04-03 DIAGNOSIS — M545 Low back pain: Secondary | ICD-10-CM | POA: Diagnosis not present

## 2016-04-03 DIAGNOSIS — I10 Essential (primary) hypertension: Secondary | ICD-10-CM | POA: Diagnosis not present

## 2016-04-14 DIAGNOSIS — F3131 Bipolar disorder, current episode depressed, mild: Secondary | ICD-10-CM | POA: Diagnosis not present

## 2016-04-15 DIAGNOSIS — M47816 Spondylosis without myelopathy or radiculopathy, lumbar region: Secondary | ICD-10-CM | POA: Diagnosis not present

## 2016-04-24 DIAGNOSIS — J209 Acute bronchitis, unspecified: Secondary | ICD-10-CM | POA: Diagnosis not present

## 2016-04-24 DIAGNOSIS — Z299 Encounter for prophylactic measures, unspecified: Secondary | ICD-10-CM | POA: Diagnosis not present

## 2016-05-13 DIAGNOSIS — M47816 Spondylosis without myelopathy or radiculopathy, lumbar region: Secondary | ICD-10-CM | POA: Diagnosis not present

## 2016-05-26 DIAGNOSIS — Z87891 Personal history of nicotine dependence: Secondary | ICD-10-CM | POA: Diagnosis not present

## 2016-05-26 DIAGNOSIS — J069 Acute upper respiratory infection, unspecified: Secondary | ICD-10-CM | POA: Diagnosis not present

## 2016-05-26 DIAGNOSIS — Z299 Encounter for prophylactic measures, unspecified: Secondary | ICD-10-CM | POA: Diagnosis not present

## 2016-06-10 DIAGNOSIS — G894 Chronic pain syndrome: Secondary | ICD-10-CM | POA: Diagnosis not present

## 2016-06-10 DIAGNOSIS — M545 Low back pain: Secondary | ICD-10-CM | POA: Diagnosis not present

## 2016-06-27 ENCOUNTER — Encounter (HOSPITAL_COMMUNITY): Payer: Self-pay | Admitting: Emergency Medicine

## 2016-06-27 ENCOUNTER — Emergency Department (HOSPITAL_COMMUNITY)
Admission: EM | Admit: 2016-06-27 | Discharge: 2016-06-27 | Disposition: A | Discharge status: Home / Self Care | Payer: PPO | Attending: Emergency Medicine | Admitting: Emergency Medicine

## 2016-06-27 ENCOUNTER — Emergency Department (HOSPITAL_COMMUNITY): Payer: PPO

## 2016-06-27 DIAGNOSIS — J45909 Unspecified asthma, uncomplicated: Secondary | ICD-10-CM | POA: Diagnosis not present

## 2016-06-27 DIAGNOSIS — Y999 Unspecified external cause status: Secondary | ICD-10-CM | POA: Diagnosis not present

## 2016-06-27 DIAGNOSIS — S60222A Contusion of left hand, initial encounter: Secondary | ICD-10-CM | POA: Insufficient documentation

## 2016-06-27 DIAGNOSIS — Z79899 Other long term (current) drug therapy: Secondary | ICD-10-CM | POA: Insufficient documentation

## 2016-06-27 DIAGNOSIS — M79642 Pain in left hand: Secondary | ICD-10-CM | POA: Diagnosis not present

## 2016-06-27 DIAGNOSIS — M545 Low back pain: Secondary | ICD-10-CM | POA: Diagnosis not present

## 2016-06-27 DIAGNOSIS — S39012A Strain of muscle, fascia and tendon of lower back, initial encounter: Secondary | ICD-10-CM | POA: Diagnosis not present

## 2016-06-27 DIAGNOSIS — Z87891 Personal history of nicotine dependence: Secondary | ICD-10-CM | POA: Diagnosis not present

## 2016-06-27 DIAGNOSIS — Y9389 Activity, other specified: Secondary | ICD-10-CM | POA: Insufficient documentation

## 2016-06-27 DIAGNOSIS — R51 Headache: Secondary | ICD-10-CM | POA: Insufficient documentation

## 2016-06-27 DIAGNOSIS — S3992XA Unspecified injury of lower back, initial encounter: Secondary | ICD-10-CM | POA: Diagnosis not present

## 2016-06-27 DIAGNOSIS — Y92410 Unspecified street and highway as the place of occurrence of the external cause: Secondary | ICD-10-CM | POA: Diagnosis not present

## 2016-06-27 MED ORDER — DIAZEPAM 5 MG PO TABS
10.0000 mg | ORAL_TABLET | Freq: Once | ORAL | Status: AC
  Administered 2016-06-27: 10 mg via ORAL
  Filled 2016-06-27: qty 2

## 2016-06-27 MED ORDER — ONDANSETRON HCL 4 MG PO TABS
4.0000 mg | ORAL_TABLET | Freq: Once | ORAL | Status: AC
  Administered 2016-06-27: 4 mg via ORAL
  Filled 2016-06-27: qty 1

## 2016-06-27 MED ORDER — MORPHINE SULFATE (PF) 4 MG/ML IV SOLN
8.0000 mg | Freq: Once | INTRAVENOUS | Status: AC
Start: 2016-06-27 — End: 2016-06-27
  Administered 2016-06-27: 8 mg via INTRAMUSCULAR
  Filled 2016-06-27: qty 2
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
## 2016-06-27 NOTE — Discharge Instructions (Signed)
The x-ray of your lumbar spine reveals degenerative changes at multiple sites. The surgical hardware remains in place. There no compression fractures noted. The x-ray of your hand shows no fracture and no dislocation. Your examination suggests muscle strain at multiple areas. Please continue your current medication for pain and for muscle relaxer. Please see your primary physician for additional management if not improving.

## 2016-07-06 DIAGNOSIS — Z79899 Other long term (current) drug therapy: Secondary | ICD-10-CM | POA: Diagnosis not present

## 2016-07-06 DIAGNOSIS — G8929 Other chronic pain: Secondary | ICD-10-CM | POA: Diagnosis not present

## 2016-07-06 DIAGNOSIS — Z9889 Other specified postprocedural states: Secondary | ICD-10-CM | POA: Diagnosis not present

## 2016-07-06 DIAGNOSIS — Z888 Allergy status to other drugs, medicaments and biological substances status: Secondary | ICD-10-CM | POA: Diagnosis not present

## 2016-07-06 DIAGNOSIS — E785 Hyperlipidemia, unspecified: Secondary | ICD-10-CM | POA: Diagnosis not present

## 2016-07-06 DIAGNOSIS — Z87891 Personal history of nicotine dependence: Secondary | ICD-10-CM | POA: Diagnosis not present

## 2016-07-06 DIAGNOSIS — F419 Anxiety disorder, unspecified: Secondary | ICD-10-CM | POA: Diagnosis not present

## 2016-07-06 DIAGNOSIS — I6789 Other cerebrovascular disease: Secondary | ICD-10-CM | POA: Diagnosis not present

## 2016-07-06 DIAGNOSIS — J45909 Unspecified asthma, uncomplicated: Secondary | ICD-10-CM | POA: Diagnosis not present

## 2016-07-06 DIAGNOSIS — K219 Gastro-esophageal reflux disease without esophagitis: Secondary | ICD-10-CM | POA: Diagnosis not present

## 2016-07-06 DIAGNOSIS — R000 Tachycardia, unspecified: Secondary | ICD-10-CM | POA: Diagnosis not present

## 2016-07-06 DIAGNOSIS — M47817 Spondylosis without myelopathy or radiculopathy, lumbosacral region: Secondary | ICD-10-CM | POA: Diagnosis not present

## 2016-07-06 DIAGNOSIS — Z9049 Acquired absence of other specified parts of digestive tract: Secondary | ICD-10-CM | POA: Diagnosis not present

## 2016-07-06 DIAGNOSIS — Z833 Family history of diabetes mellitus: Secondary | ICD-10-CM | POA: Diagnosis not present

## 2016-07-06 DIAGNOSIS — M549 Dorsalgia, unspecified: Secondary | ICD-10-CM | POA: Diagnosis not present

## 2016-07-06 DIAGNOSIS — Z981 Arthrodesis status: Secondary | ICD-10-CM | POA: Diagnosis not present

## 2016-07-06 DIAGNOSIS — Z87442 Personal history of urinary calculi: Secondary | ICD-10-CM | POA: Diagnosis not present

## 2016-07-06 DIAGNOSIS — Z883 Allergy status to other anti-infective agents status: Secondary | ICD-10-CM | POA: Diagnosis not present

## 2016-07-06 DIAGNOSIS — R251 Tremor, unspecified: Secondary | ICD-10-CM | POA: Diagnosis not present

## 2016-07-06 DIAGNOSIS — Z8489 Family history of other specified conditions: Secondary | ICD-10-CM | POA: Diagnosis not present

## 2016-07-07 DIAGNOSIS — M549 Dorsalgia, unspecified: Secondary | ICD-10-CM | POA: Diagnosis not present

## 2016-07-07 DIAGNOSIS — M47817 Spondylosis without myelopathy or radiculopathy, lumbosacral region: Secondary | ICD-10-CM | POA: Diagnosis not present

## 2016-07-07 DIAGNOSIS — G8929 Other chronic pain: Secondary | ICD-10-CM | POA: Diagnosis not present

## 2016-07-07 DIAGNOSIS — R251 Tremor, unspecified: Secondary | ICD-10-CM | POA: Diagnosis not present

## 2016-07-09 DIAGNOSIS — M791 Myalgia: Secondary | ICD-10-CM | POA: Diagnosis not present

## 2016-07-09 DIAGNOSIS — G894 Chronic pain syndrome: Secondary | ICD-10-CM | POA: Diagnosis not present

## 2016-07-09 DIAGNOSIS — M545 Low back pain: Secondary | ICD-10-CM | POA: Diagnosis not present

## 2016-07-15 ENCOUNTER — Ambulatory Visit: Payer: PPO | Admitting: Pulmonary Disease

## 2016-07-15 DIAGNOSIS — Z09 Encounter for follow-up examination after completed treatment for conditions other than malignant neoplasm: Secondary | ICD-10-CM | POA: Diagnosis not present

## 2016-07-15 DIAGNOSIS — Z299 Encounter for prophylactic measures, unspecified: Secondary | ICD-10-CM | POA: Diagnosis not present

## 2016-07-15 DIAGNOSIS — R251 Tremor, unspecified: Secondary | ICD-10-CM | POA: Diagnosis not present

## 2016-07-21 DIAGNOSIS — Z713 Dietary counseling and surveillance: Secondary | ICD-10-CM | POA: Diagnosis not present

## 2016-07-21 DIAGNOSIS — B079 Viral wart, unspecified: Secondary | ICD-10-CM | POA: Diagnosis not present

## 2016-07-21 DIAGNOSIS — Z6826 Body mass index (BMI) 26.0-26.9, adult: Secondary | ICD-10-CM | POA: Diagnosis not present

## 2016-07-21 DIAGNOSIS — E7800 Pure hypercholesterolemia, unspecified: Secondary | ICD-10-CM | POA: Diagnosis not present

## 2016-07-21 DIAGNOSIS — Z299 Encounter for prophylactic measures, unspecified: Secondary | ICD-10-CM | POA: Diagnosis not present

## 2016-08-04 DIAGNOSIS — Z1231 Encounter for screening mammogram for malignant neoplasm of breast: Secondary | ICD-10-CM | POA: Diagnosis not present

## 2016-08-05 DIAGNOSIS — Z79891 Long term (current) use of opiate analgesic: Secondary | ICD-10-CM | POA: Diagnosis not present

## 2016-08-05 DIAGNOSIS — Z79899 Other long term (current) drug therapy: Secondary | ICD-10-CM | POA: Diagnosis not present

## 2016-08-05 DIAGNOSIS — M791 Myalgia: Secondary | ICD-10-CM | POA: Diagnosis not present

## 2016-08-05 DIAGNOSIS — G894 Chronic pain syndrome: Secondary | ICD-10-CM | POA: Diagnosis not present

## 2016-08-05 DIAGNOSIS — M546 Pain in thoracic spine: Secondary | ICD-10-CM | POA: Diagnosis not present

## 2016-08-11 DIAGNOSIS — H5212 Myopia, left eye: Secondary | ICD-10-CM | POA: Diagnosis not present

## 2016-08-11 DIAGNOSIS — H524 Presbyopia: Secondary | ICD-10-CM | POA: Diagnosis not present

## 2016-08-11 DIAGNOSIS — H52223 Regular astigmatism, bilateral: Secondary | ICD-10-CM | POA: Diagnosis not present

## 2016-08-19 ENCOUNTER — Other Ambulatory Visit (INDEPENDENT_AMBULATORY_CARE_PROVIDER_SITE_OTHER): Payer: PPO

## 2016-08-19 ENCOUNTER — Encounter: Payer: Self-pay | Admitting: Pulmonary Disease

## 2016-08-19 ENCOUNTER — Ambulatory Visit (INDEPENDENT_AMBULATORY_CARE_PROVIDER_SITE_OTHER): Payer: PPO | Admitting: Pulmonary Disease

## 2016-08-19 VITALS — BP 100/70 | HR 93 | Ht 65.0 in | Wt 153.0 lb

## 2016-08-19 DIAGNOSIS — J4550 Severe persistent asthma, uncomplicated: Secondary | ICD-10-CM

## 2016-08-19 LAB — CBC WITH DIFFERENTIAL/PLATELET
Basophils Absolute: 0.1 10*3/uL (ref 0.0–0.1)
Basophils Relative: 0.7 % (ref 0.0–3.0)
EOS PCT: 2.3 % (ref 0.0–5.0)
Eosinophils Absolute: 0.2 10*3/uL (ref 0.0–0.7)
HEMATOCRIT: 44.4 % (ref 36.0–46.0)
HEMOGLOBIN: 15.1 g/dL — AB (ref 12.0–15.0)
LYMPHS ABS: 1.5 10*3/uL (ref 0.7–4.0)
Lymphocytes Relative: 19.7 % (ref 12.0–46.0)
MCHC: 34.1 g/dL (ref 30.0–36.0)
MCV: 93.7 fl (ref 78.0–100.0)
MONOS PCT: 7.2 % (ref 3.0–12.0)
Monocytes Absolute: 0.6 10*3/uL (ref 0.1–1.0)
NEUTROS PCT: 70.1 % (ref 43.0–77.0)
Neutro Abs: 5.4 10*3/uL (ref 1.4–7.7)
Platelets: 297 10*3/uL (ref 150.0–400.0)
RBC: 4.74 Mil/uL (ref 3.87–5.11)
RDW: 14.3 % (ref 11.5–15.5)
WBC: 7.7 10*3/uL (ref 4.0–10.5)

## 2016-08-19 LAB — NITRIC OXIDE: Nitric Oxide: 10

## 2016-08-19 MED ORDER — FLUTICASONE-UMECLIDIN-VILANT 100-62.5-25 MCG/INH IN AEPB: 1.0000 | INHALATION_SPRAY | Freq: Every day | RESPIRATORY_TRACT | 5 refills | Status: DC
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
## 2016-08-19 NOTE — Patient Instructions (Signed)
Lab tests today >> CBC with differential, IgE Based on lab tests will determine if you are eligible for one of the newer biologic agents for severe allergic asthma  Trelegy one puff daily, and rinse out mouth after each use  Follow up in 3 months

## 2016-08-20 LAB — IGE: IgE (Immunoglobulin E), Serum: 120 kU/L — ABNORMAL HIGH (ref <115)

## 2016-08-25 DIAGNOSIS — Z299 Encounter for prophylactic measures, unspecified: Secondary | ICD-10-CM | POA: Diagnosis not present

## 2016-08-25 DIAGNOSIS — Z6826 Body mass index (BMI) 26.0-26.9, adult: Secondary | ICD-10-CM | POA: Diagnosis not present

## 2016-08-25 DIAGNOSIS — L570 Actinic keratosis: Secondary | ICD-10-CM | POA: Diagnosis not present

## 2016-08-29 ENCOUNTER — Telehealth: Payer: Self-pay | Admitting: Pulmonary Disease

## 2016-08-29 NOTE — Telephone Encounter (Signed)
Labs 08/19/16 >> IgE 120   CBC    Component Value Date/Time   WBC 7.7 08/19/2016 1228   RBC 4.74 08/19/2016 1228   HGB 15.1 (H) 08/19/2016 1228   HCT 44.4 08/19/2016 1228   PLT 297.0 08/19/2016 1228   MCV 93.7 08/19/2016 1228   MCH 30.7 06/29/2010 1353   MCHC 34.1 08/19/2016 1228   RDW 14.3 08/19/2016 1228   LYMPHSABS 1.5 08/19/2016 1228   MONOABS 0.6 08/19/2016 1228   EOSABS 0.2 08/19/2016 1228   BASOSABS 0.1 08/19/2016 1228     Will have my nurse inform patient that labs are consistent with difficult to control asthma with eosinophilic phenotype.  If she is agreeable, then please start process to get approval for fasenra.

## 2016-08-29 NOTE — Progress Notes (Unsigned)
Labs 08/19/16 >> IgE 120   CBC    Component Value Date/Time   WBC 7.7 08/19/2016 1228   RBC 4.74 08/19/2016 1228   HGB 15.1 (H) 08/19/2016 1228   HCT 44.4 08/19/2016 1228   PLT 297.0 08/19/2016 1228   MCV 93.7 08/19/2016 1228   MCH 30.7 06/29/2010 1353   MCHC 34.1 08/19/2016 1228   RDW 14.3 08/19/2016 1228   LYMPHSABS 1.5 08/19/2016 1228   MONOABS 0.6 08/19/2016 1228   EOSABS 0.2 08/19/2016 1228   BASOSABS 0.1 08/19/2016 1228     Will have my nurse inform pt that labs are consistent with her difficult to control severe asthma with eosinophilic phenotype.  If she is agreeable, then please start the process to get approval for fasenra.

## 2016-09-08 DIAGNOSIS — M546 Pain in thoracic spine: Secondary | ICD-10-CM | POA: Diagnosis not present

## 2016-09-08 DIAGNOSIS — M542 Cervicalgia: Secondary | ICD-10-CM | POA: Diagnosis not present

## 2016-09-08 DIAGNOSIS — G894 Chronic pain syndrome: Secondary | ICD-10-CM | POA: Diagnosis not present

## 2016-09-09 NOTE — Telephone Encounter (Signed)
Pt advised of lab results and sgrees to try the medication recommended by VS.

## 2016-09-10 ENCOUNTER — Other Ambulatory Visit: Payer: Self-pay | Admitting: Orthopaedic Surgery

## 2016-09-10 DIAGNOSIS — M542 Cervicalgia: Secondary | ICD-10-CM

## 2016-09-10 NOTE — Telephone Encounter (Signed)
Nothing further needed 

## 2016-09-11 ENCOUNTER — Emergency Department (HOSPITAL_COMMUNITY)
Admission: EM | Admit: 2016-09-11 | Discharge: 2016-09-11 | Disposition: A | Discharge status: Home / Self Care | Payer: PPO | Attending: Emergency Medicine | Admitting: Emergency Medicine

## 2016-09-11 ENCOUNTER — Encounter (HOSPITAL_COMMUNITY): Payer: Self-pay | Admitting: Emergency Medicine

## 2016-09-11 DIAGNOSIS — Z87891 Personal history of nicotine dependence: Secondary | ICD-10-CM | POA: Insufficient documentation

## 2016-09-11 DIAGNOSIS — Z79899 Other long term (current) drug therapy: Secondary | ICD-10-CM | POA: Insufficient documentation

## 2016-09-11 DIAGNOSIS — G8929 Other chronic pain: Secondary | ICD-10-CM

## 2016-09-11 DIAGNOSIS — E663 Overweight: Secondary | ICD-10-CM | POA: Diagnosis not present

## 2016-09-11 DIAGNOSIS — J45909 Unspecified asthma, uncomplicated: Secondary | ICD-10-CM | POA: Diagnosis not present

## 2016-09-11 DIAGNOSIS — M542 Cervicalgia: Secondary | ICD-10-CM | POA: Insufficient documentation

## 2016-09-11 DIAGNOSIS — R5383 Other fatigue: Secondary | ICD-10-CM | POA: Diagnosis not present

## 2016-09-11 DIAGNOSIS — E7800 Pure hypercholesterolemia, unspecified: Secondary | ICD-10-CM | POA: Diagnosis not present

## 2016-09-11 DIAGNOSIS — K589 Irritable bowel syndrome without diarrhea: Secondary | ICD-10-CM | POA: Diagnosis not present

## 2016-09-11 DIAGNOSIS — Z0000 Encounter for general adult medical examination without abnormal findings: Secondary | ICD-10-CM | POA: Diagnosis not present

## 2016-09-11 DIAGNOSIS — G43909 Migraine, unspecified, not intractable, without status migrainosus: Secondary | ICD-10-CM | POA: Diagnosis not present

## 2016-09-11 DIAGNOSIS — Z299 Encounter for prophylactic measures, unspecified: Secondary | ICD-10-CM | POA: Diagnosis not present

## 2016-09-11 DIAGNOSIS — Z7189 Other specified counseling: Secondary | ICD-10-CM | POA: Diagnosis not present

## 2016-09-11 DIAGNOSIS — Z1389 Encounter for screening for other disorder: Secondary | ICD-10-CM | POA: Diagnosis not present

## 2016-09-11 DIAGNOSIS — F329 Major depressive disorder, single episode, unspecified: Secondary | ICD-10-CM | POA: Diagnosis not present

## 2016-09-11 HISTORY — DX: Dorsalgia, unspecified: M54.9

## 2016-09-11 HISTORY — DX: Other chronic pain: G89.29

## 2016-09-11 HISTORY — DX: Cervicalgia: M54.2

## 2016-09-11 MED ORDER — KETOROLAC TROMETHAMINE 60 MG/2ML IM SOLN
60.0000 mg | Freq: Once | INTRAMUSCULAR | Status: AC
  Administered 2016-09-11: 60 mg via INTRAMUSCULAR
  Filled 2016-09-11: qty 2

## 2016-09-11 MED ORDER — OXYCODONE-ACETAMINOPHEN 5-325 MG PO TABS
2.0000 | ORAL_TABLET | Freq: Once | ORAL | Status: AC
Start: 2016-09-11 — End: 2016-09-11
  Administered 2016-09-11: 2 via ORAL
  Filled 2016-09-11: qty 2

## 2016-09-11 MED ORDER — DIAZEPAM 5 MG PO TABS
10.0000 mg | ORAL_TABLET | Freq: Once | ORAL | Status: AC
  Administered 2016-09-11: 10 mg via ORAL
  Filled 2016-09-11: qty 2

## 2016-09-11 NOTE — Discharge Instructions (Signed)
Apply ice packs on/off to your neck.   Call your doctor's office to arrange a follow-up.  Continue taking your medications as directed including your muscle relaxer.

## 2016-09-11 NOTE — ED Notes (Signed)
Patient given discharge instruction, verbalized understand. Patient ambulatory out of the department.  

## 2016-09-11 NOTE — ED Notes (Signed)
Pt has bulging disk, neck pain radiating down right arm

## 2016-09-11 NOTE — ED Triage Notes (Signed)
PT states right sided neck pain that radiated down right arm worsening x3 days. PT states no relief from increased Neurontin dosing and steroid medication prescribed by her PCP this week and states chronic neck pain.

## 2016-09-12 NOTE — ED Provider Notes (Signed)
[facility] DEPT Provider Note   CSN: [id number] Arrival date & time: 09/11/16  1310     History   Chief Complaint Chief Complaint  Patient presents with  . Neck Pain    HPI Nancy [name] is a 51 y.o. female.  HPI  Nancy [name] is a 51 y.o. female with hx of chronic neck pain presents to the Emergency Department complaining of increasing right sided neck pain for 3 days.  She describes a sharp pain from the right neck that radiates down her right arm for several months. She was seen by her neurosurgeon several days ago for same and she is scheduled to have a MR of the C spine on 09/14/16.  She states that she takes hydrocodone 10 mg for pain control and her Neurontin dosage was increased as well as prescribed a steroid taper without relief.  She denies recent trauma, fever chills, numbness or weakness of the upper extremity.     Past Medical History:  Diagnosis Date  . Allergic rhinitis   . Asthma   . Benign familial tremor   . Bipolar 1 disorder ([place])   . Chronic neck and back pain   . Degenerative disk disease   . Dyslipidemia   . Fibromyalgia   . GERD (gastroesophageal reflux disease)   . Hiatal hernia   . Irritable bowel syndrome (IBS)   . Memory disorder 07/18/2014  . Memory loss   . Migraine headache   . Nephrolithiasis   . Pneumonia 1986  . Serotonin syndrome     Patient Active Problem List   Diagnosis Date Noted  . Memory disorder 07/18/2014  . Chest pain of uncertain etiology 43/15/4008  . Exercise-induced asthma 07/30/2012  . Severe persistent asthma in adult without complication 67/61/9509    Past Surgical History:  Procedure Laterality Date  . ANKLE FRACTURE SURGERY  2006   Right  . APPENDECTOMY    . back fusion    . BACK SURGERY    . BRONCHOSCOPY     S. aureus from BAL  . CARDIAC CATHETERIZATION  November 27, 2006   minimal CAD  . ESOPHAGEAL DILATION  August 06, 2004  . LAPAROSCOPIC CHOLECYSTECTOMY    . LASER ABLATION OF THE CERVIX    .  MICRODISSECTION L5-S1  August 13, 2000  . TONSILLECTOMY      OB History    No data available       Home Medications    Prior to Admission medications   Medication Sig Start Date End Date Taking? Authorizing Provider  albuterol (PROVENTIL HFA;VENTOLIN HFA) 108 (90 BASE) MCG/ACT inhaler Inhale 2 puffs into the lungs every 6 (six) hours as needed. 05/28/11  Yes Chesley Mires, MD  albuterol (PROVENTIL) (2.5 MG/3ML) 0.083% nebulizer solution Take 2.5 mg by nebulization every 6 (six) hours as needed.     Yes Historical Provider, MD  Ascorbic Acid (VITAMIN C) 1000 MG tablet Take 1,000 mg by mouth daily.   Yes Historical Provider, MD  atorvastatin (LIPITOR) 10 MG tablet Take 10 mg by mouth daily.   Yes Historical Provider, MD  buPROPion (WELLBUTRIN XL) 300 MG 24 hr tablet Take 400 mg by mouth daily.    Yes Historical Provider, MD  carbamazepine (TEGRETOL-XR) 100 MG 12 hr tablet Take 300 mg by mouth at bedtime.    Yes Historical Provider, MD  cetirizine (ZYRTEC) 10 MG tablet Take 10 mg by mouth daily.   Yes Historical Provider, MD  cyclobenzaprine (FLEXERIL) 10 MG tablet Take  10 mg by mouth 2 (two) times daily as needed for muscle spasms.    Yes Historical Provider, MD  docusate sodium (COLACE) 100 MG capsule Take 200 mg by mouth daily.   Yes Historical Provider, MD  Fluticasone-Umeclidin-Vilant (TRELEGY ELLIPTA) 100-62.5-25 MCG/INH AEPB Inhale 1 puff into the lungs daily. 08/19/16  Yes Chesley Mires, MD  gabapentin (NEURONTIN) 300 MG capsule Take 1,200 mg by mouth 2 (two) times daily.    Yes Historical Provider, MD  HYDROcodone-acetaminophen (NORCO) 10-325 MG per tablet Take 1 tablet by mouth 3 (three) times daily.   Yes Historical Provider, MD  lamoTRIgine (LAMICTAL) 200 MG tablet Take 100 mg by mouth daily.    Yes Historical Provider, MD  montelukast (SINGULAIR) 10 MG tablet Take 10 mg by mouth at bedtime.    Yes Historical Provider, MD  omeprazole (PRILOSEC) 40 MG capsule Take 40 mg by mouth daily.    Yes Historical Provider, MD  budesonide-formoterol (SYMBICORT) 160-4.5 MCG/ACT inhaler INHALE 2 PUFFS INTO THE LUNGS 2 (TWO) TIMES DAILY. Patient not taking: Reported on 09/11/2016 03/20/15   Chesley Mires, MD  HYDROcodone-homatropine (HYDROMET) 5-1.5 MG/5ML syrup Take 5 mLs by mouth every 4 (four) hours as needed for cough. Patient not taking: Reported on 09/11/2016 11/14/13   Melvenia Needles, NP    Family History Family History  Problem Relation Age of Onset  . Allergies Mother   . Clotting disorder Mother   . Allergies Father   . Heart disease Father   . Allergies Brother   . Chorea Maternal Grandmother     Social History Social History  Substance Use Topics  . Smoking status: Former Smoker    Packs/day: 0.50    Years: 8.00    Types: Cigarettes    Quit date: 06/09/1978  . Smokeless tobacco: Never Used  . Alcohol use No     Allergies   Cephalexin; Cephalosporins; Cymbalta [duloxetine hcl]; Divalproex sodium; Effexor [venlafaxine hydrochloride]; Geodon [ziprasidone hydrochloride]; Hydrocodone; Prozac [fluoxetine hcl]; Theo-dur [theophylline]; Thorazine [chlorpromazine hcl]; and Topamax   Review of Systems Review of Systems  Constitutional: Negative for chills and fever.  Eyes: Negative for visual disturbance.  Cardiovascular: Negative for chest pain.  Musculoskeletal: Positive for neck pain. Negative for joint swelling.  Skin: Negative for color change and wound.  Neurological: Negative for dizziness, syncope, weakness, numbness and headaches.  All other systems reviewed and are negative.    Physical Exam Updated Vital Signs BP 128/82 (BP Location: Left Leg)   Pulse 91   Temp 97.8 F (36.6 C) (Oral)   Resp 20   Ht 5\' 5"  (1.651 m)   Wt 70.3 kg   SpO2 96%   BMI 25.79 kg/m   Physical Exam  Constitutional: She is oriented to person, place, and time. She appears well-nourished. No distress.  HENT:  Head: Atraumatic.  Mouth/Throat: Oropharynx is clear and moist.    Eyes: EOM are normal. Pupils are equal, round, and reactive to light.  Cardiovascular: Normal rate, regular rhythm and intact distal pulses.   Pulmonary/Chest: Effort normal and breath sounds normal. No respiratory distress.  Musculoskeletal: She exhibits tenderness. She exhibits no edema or deformity.       Cervical back: She exhibits tenderness. She exhibits no swelling.  ttp of the midline c spine and right SCM and trapezius muscles.  No edema.  Grip strength strong and symmetrical bilaterally  Lymphadenopathy:    She has no cervical adenopathy.  Neurological: She is alert and oriented to person, place, and time.  She has normal strength. No sensory deficit. Gait normal. GCS eye subscore is 4. GCS verbal subscore is 5. GCS motor subscore is 6.  Reflex Scores:      Tricep reflexes are 2+ on the right side and 2+ on the left side.      Bicep reflexes are 2+ on the right side and 2+ on the left side. Skin: Capillary refill takes less than 2 seconds. No rash noted.  Psychiatric: She has a normal mood and affect.  Nursing note and vitals reviewed.    ED Treatments / Results  Labs (all labs ordered are listed, but only abnormal results are displayed) Labs Reviewed - No data to display  EKG  EKG Interpretation None       Radiology No results found.  Procedures Procedures (including critical care time)  Medications Ordered in ED Medications  ketorolac (TORADOL) injection 60 mg (60 mg Intramuscular Given 09/11/16 1420)  diazepam (VALIUM) tablet 10 mg (10 mg Oral Given 09/11/16 1418)  oxyCODONE-acetaminophen (PERCOCET/ROXICET) 5-325 MG per tablet 2 tablet (2 tablets Oral Given 09/11/16 1509)     Initial Impression / Assessment and Plan / ED Course  I have reviewed the triage vital signs and the nursing notes.  Pertinent labs & imaging results that were available during my care of the patient were reviewed by me and considered in my medical decision making (see chart for  details).     Pt with likely acute on chronic neck pain. No concerning sx's for emergent neurological process.  Pain addressed here.  Pt currently taking narcotic pain medication and I do not feel that additional prescription narcotics are indicated.  She is feeling better and appears stable for d/c  Final Clinical Impressions(s) / ED Diagnoses   Final diagnoses:  Chronic neck pain    New Prescriptions Discharge Medication List as of 09/11/2016  3:33 PM       Tersa Fotopoulos Ammie Ferrier 09/12/16 [place], MD 09/13/16 [phone]

## 2016-09-14 ENCOUNTER — Ambulatory Visit
Admission: RE | Admit: 2016-09-14 | Discharge: 2016-09-14 | Disposition: A | Discharge status: Home / Self Care | Payer: PPO | Source: Ambulatory Visit | Attending: Orthopaedic Surgery | Admitting: Orthopaedic Surgery

## 2016-09-14 DIAGNOSIS — M542 Cervicalgia: Secondary | ICD-10-CM

## 2016-09-14 DIAGNOSIS — M4802 Spinal stenosis, cervical region: Secondary | ICD-10-CM | POA: Diagnosis not present

## 2016-09-16 DIAGNOSIS — G894 Chronic pain syndrome: Secondary | ICD-10-CM | POA: Diagnosis not present

## 2016-09-16 DIAGNOSIS — M542 Cervicalgia: Secondary | ICD-10-CM | POA: Diagnosis not present

## 2016-09-16 DIAGNOSIS — Z6834 Body mass index (BMI) 34.0-34.9, adult: Secondary | ICD-10-CM | POA: Diagnosis not present

## 2016-09-18 DIAGNOSIS — Z6830 Body mass index (BMI) 30.0-30.9, adult: Secondary | ICD-10-CM | POA: Diagnosis not present

## 2016-09-18 DIAGNOSIS — G894 Chronic pain syndrome: Secondary | ICD-10-CM | POA: Diagnosis not present

## 2016-09-18 DIAGNOSIS — M5412 Radiculopathy, cervical region: Secondary | ICD-10-CM | POA: Diagnosis not present

## 2016-09-18 DIAGNOSIS — M542 Cervicalgia: Secondary | ICD-10-CM | POA: Diagnosis not present

## 2016-09-21 ENCOUNTER — Other Ambulatory Visit: Payer: PPO

## 2016-10-14 DIAGNOSIS — F3174 Bipolar disorder, in full remission, most recent episode manic: Secondary | ICD-10-CM | POA: Diagnosis not present

## 2016-10-14 DIAGNOSIS — F3176 Bipolar disorder, in full remission, most recent episode depressed: Secondary | ICD-10-CM | POA: Diagnosis not present

## 2016-10-17 DIAGNOSIS — M5001 Cervical disc disorder with myelopathy,  high cervical region: Secondary | ICD-10-CM | POA: Diagnosis not present

## 2016-10-17 DIAGNOSIS — M50122 Cervical disc disorder at C5-C6 level with radiculopathy: Secondary | ICD-10-CM | POA: Diagnosis not present

## 2016-10-17 DIAGNOSIS — M542 Cervicalgia: Secondary | ICD-10-CM | POA: Diagnosis not present

## 2016-10-17 DIAGNOSIS — M50021 Cervical disc disorder at C4-C5 level with myelopathy: Secondary | ICD-10-CM | POA: Diagnosis not present

## 2016-10-21 DIAGNOSIS — Z01818 Encounter for other preprocedural examination: Secondary | ICD-10-CM | POA: Diagnosis not present

## 2016-10-21 DIAGNOSIS — M50123 Cervical disc disorder at C6-C7 level with radiculopathy: Secondary | ICD-10-CM | POA: Diagnosis not present

## 2016-10-27 DIAGNOSIS — M5001 Cervical disc disorder with myelopathy,  high cervical region: Secondary | ICD-10-CM | POA: Diagnosis not present

## 2016-10-27 DIAGNOSIS — Z981 Arthrodesis status: Secondary | ICD-10-CM | POA: Diagnosis not present

## 2016-10-27 DIAGNOSIS — M4722 Other spondylosis with radiculopathy, cervical region: Secondary | ICD-10-CM | POA: Diagnosis not present

## 2016-10-27 DIAGNOSIS — M4712 Other spondylosis with myelopathy, cervical region: Secondary | ICD-10-CM | POA: Diagnosis not present

## 2016-10-27 DIAGNOSIS — M50121 Cervical disc disorder at C4-C5 level with radiculopathy: Secondary | ICD-10-CM | POA: Diagnosis not present

## 2016-10-27 DIAGNOSIS — M50122 Cervical disc disorder at C5-C6 level with radiculopathy: Secondary | ICD-10-CM | POA: Diagnosis not present

## 2016-10-27 DIAGNOSIS — K219 Gastro-esophageal reflux disease without esophagitis: Secondary | ICD-10-CM | POA: Diagnosis not present

## 2016-10-27 DIAGNOSIS — M532X2 Spinal instabilities, cervical region: Secondary | ICD-10-CM | POA: Diagnosis not present

## 2016-10-27 DIAGNOSIS — M50123 Cervical disc disorder at C6-C7 level with radiculopathy: Secondary | ICD-10-CM | POA: Diagnosis not present

## 2016-10-27 DIAGNOSIS — G4730 Sleep apnea, unspecified: Secondary | ICD-10-CM | POA: Diagnosis not present

## 2016-10-27 DIAGNOSIS — Z87891 Personal history of nicotine dependence: Secondary | ICD-10-CM | POA: Diagnosis not present

## 2016-10-27 DIAGNOSIS — J45909 Unspecified asthma, uncomplicated: Secondary | ICD-10-CM | POA: Diagnosis not present

## 2016-10-28 DIAGNOSIS — Z981 Arthrodesis status: Secondary | ICD-10-CM | POA: Diagnosis not present

## 2016-10-29 DIAGNOSIS — M4800 Spinal stenosis, site unspecified: Secondary | ICD-10-CM | POA: Diagnosis not present

## 2016-10-30 DIAGNOSIS — F329 Major depressive disorder, single episode, unspecified: Secondary | ICD-10-CM | POA: Diagnosis not present

## 2016-10-30 DIAGNOSIS — Z79891 Long term (current) use of opiate analgesic: Secondary | ICD-10-CM | POA: Diagnosis not present

## 2016-10-30 DIAGNOSIS — Z7951 Long term (current) use of inhaled steroids: Secondary | ICD-10-CM | POA: Diagnosis not present

## 2016-10-30 DIAGNOSIS — Z4789 Encounter for other orthopedic aftercare: Secondary | ICD-10-CM | POA: Diagnosis not present

## 2016-10-30 DIAGNOSIS — G894 Chronic pain syndrome: Secondary | ICD-10-CM | POA: Diagnosis not present

## 2016-10-30 DIAGNOSIS — J45909 Unspecified asthma, uncomplicated: Secondary | ICD-10-CM | POA: Diagnosis not present

## 2016-11-06 DIAGNOSIS — F329 Major depressive disorder, single episode, unspecified: Secondary | ICD-10-CM | POA: Diagnosis not present

## 2016-11-06 DIAGNOSIS — G894 Chronic pain syndrome: Secondary | ICD-10-CM | POA: Diagnosis not present

## 2016-11-06 DIAGNOSIS — Z7951 Long term (current) use of inhaled steroids: Secondary | ICD-10-CM | POA: Diagnosis not present

## 2016-11-06 DIAGNOSIS — Z79891 Long term (current) use of opiate analgesic: Secondary | ICD-10-CM | POA: Diagnosis not present

## 2016-11-06 DIAGNOSIS — J45909 Unspecified asthma, uncomplicated: Secondary | ICD-10-CM | POA: Diagnosis not present

## 2016-11-06 DIAGNOSIS — Z4789 Encounter for other orthopedic aftercare: Secondary | ICD-10-CM | POA: Diagnosis not present

## 2016-11-07 DIAGNOSIS — M542 Cervicalgia: Secondary | ICD-10-CM | POA: Diagnosis not present

## 2016-11-07 DIAGNOSIS — Z6827 Body mass index (BMI) 27.0-27.9, adult: Secondary | ICD-10-CM | POA: Diagnosis not present

## 2016-11-07 DIAGNOSIS — F329 Major depressive disorder, single episode, unspecified: Secondary | ICD-10-CM | POA: Diagnosis not present

## 2016-11-07 DIAGNOSIS — Z299 Encounter for prophylactic measures, unspecified: Secondary | ICD-10-CM | POA: Diagnosis not present

## 2016-11-07 DIAGNOSIS — Z09 Encounter for follow-up examination after completed treatment for conditions other than malignant neoplasm: Secondary | ICD-10-CM | POA: Diagnosis not present

## 2016-11-07 DIAGNOSIS — K219 Gastro-esophageal reflux disease without esophagitis: Secondary | ICD-10-CM | POA: Diagnosis not present

## 2016-11-07 DIAGNOSIS — G43909 Migraine, unspecified, not intractable, without status migrainosus: Secondary | ICD-10-CM | POA: Diagnosis not present

## 2016-11-07 DIAGNOSIS — Z713 Dietary counseling and surveillance: Secondary | ICD-10-CM | POA: Diagnosis not present

## 2016-11-07 DIAGNOSIS — E7800 Pure hypercholesterolemia, unspecified: Secondary | ICD-10-CM | POA: Diagnosis not present

## 2016-11-11 DIAGNOSIS — G894 Chronic pain syndrome: Secondary | ICD-10-CM | POA: Diagnosis not present

## 2016-11-11 DIAGNOSIS — F329 Major depressive disorder, single episode, unspecified: Secondary | ICD-10-CM | POA: Diagnosis not present

## 2016-11-11 DIAGNOSIS — J45909 Unspecified asthma, uncomplicated: Secondary | ICD-10-CM | POA: Diagnosis not present

## 2016-11-11 DIAGNOSIS — Z4789 Encounter for other orthopedic aftercare: Secondary | ICD-10-CM | POA: Diagnosis not present

## 2016-11-11 DIAGNOSIS — Z7951 Long term (current) use of inhaled steroids: Secondary | ICD-10-CM | POA: Diagnosis not present

## 2016-11-11 DIAGNOSIS — Z79891 Long term (current) use of opiate analgesic: Secondary | ICD-10-CM | POA: Diagnosis not present

## 2016-11-12 DIAGNOSIS — Z4789 Encounter for other orthopedic aftercare: Secondary | ICD-10-CM | POA: Diagnosis not present

## 2016-11-12 DIAGNOSIS — F329 Major depressive disorder, single episode, unspecified: Secondary | ICD-10-CM | POA: Diagnosis not present

## 2016-11-12 DIAGNOSIS — G894 Chronic pain syndrome: Secondary | ICD-10-CM | POA: Diagnosis not present

## 2016-11-12 DIAGNOSIS — J45909 Unspecified asthma, uncomplicated: Secondary | ICD-10-CM | POA: Diagnosis not present

## 2016-11-13 DIAGNOSIS — E7800 Pure hypercholesterolemia, unspecified: Secondary | ICD-10-CM | POA: Diagnosis not present

## 2016-11-13 DIAGNOSIS — Z299 Encounter for prophylactic measures, unspecified: Secondary | ICD-10-CM | POA: Diagnosis not present

## 2016-11-13 DIAGNOSIS — F329 Major depressive disorder, single episode, unspecified: Secondary | ICD-10-CM | POA: Diagnosis not present

## 2016-11-13 DIAGNOSIS — I809 Phlebitis and thrombophlebitis of unspecified site: Secondary | ICD-10-CM | POA: Diagnosis not present

## 2016-11-13 DIAGNOSIS — Z6825 Body mass index (BMI) 25.0-25.9, adult: Secondary | ICD-10-CM | POA: Diagnosis not present

## 2016-11-24 ENCOUNTER — Ambulatory Visit (INDEPENDENT_AMBULATORY_CARE_PROVIDER_SITE_OTHER): Payer: PPO | Admitting: Pulmonary Disease

## 2016-11-24 ENCOUNTER — Encounter: Payer: Self-pay | Admitting: Pulmonary Disease

## 2016-11-24 VITALS — BP 114/78 | HR 113 | Ht 65.0 in | Wt 163.8 lb

## 2016-11-24 DIAGNOSIS — J82 Pulmonary eosinophilia, not elsewhere classified: Secondary | ICD-10-CM | POA: Diagnosis not present

## 2016-11-24 DIAGNOSIS — J4550 Severe persistent asthma, uncomplicated: Secondary | ICD-10-CM | POA: Diagnosis not present

## 2016-11-24 DIAGNOSIS — J8283 Eosinophilic asthma: Secondary | ICD-10-CM

## 2016-11-24 DIAGNOSIS — J3089 Other allergic rhinitis: Secondary | ICD-10-CM

## 2016-11-24 NOTE — Patient Instructions (Signed)
Will start the process to get approval for Cinqair to treat asthma  Follow up in 3 months

## 2016-11-24 NOTE — Progress Notes (Signed)
Current Outpatient Prescriptions on File Prior to Visit  Medication Sig  . albuterol (PROVENTIL HFA;VENTOLIN HFA) 108 (90 BASE) MCG/ACT inhaler Inhale 2 puffs into the lungs every 6 (six) hours as needed.  [name] albuterol (PROVENTIL) (2.5 MG/3ML) 0.083% nebulizer solution Take 2.5 mg by nebulization every 6 (six) hours as needed.    [name] atorvastatin (LIPITOR) 10 MG tablet Take 10 mg by mouth daily.  . budesonide-formoterol (SYMBICORT) 160-4.5 MCG/ACT inhaler INHALE 2 PUFFS INTO THE LUNGS 2 (TWO) TIMES DAILY.  [name] buPROPion (WELLBUTRIN XL) 300 MG 24 hr tablet Take 400 mg by mouth daily.   . carbamazepine (TEGRETOL-XR) 100 MG 12 hr tablet Take 300 mg by mouth at bedtime.   . cetirizine (ZYRTEC) 10 MG tablet Take 10 mg by mouth daily.  . cyclobenzaprine (FLEXERIL) 10 MG tablet Take 10 mg by mouth 2 (two) times daily as needed for muscle spasms.   [name] docusate sodium (COLACE) 100 MG capsule Take 200 mg by mouth daily.  [name] gabapentin (NEURONTIN) 300 MG capsule Take 1,200 mg by mouth 2 (two) times daily.   [name] HYDROcodone-acetaminophen (NORCO) 10-325 MG per tablet Take 1 tablet by mouth 3 (three) times daily.  [name] HYDROcodone-homatropine (HYDROMET) 5-1.5 MG/5ML syrup Take 5 mLs by mouth every 4 (four) hours as needed for cough.  . lamoTRIgine (LAMICTAL) 200 MG tablet Take 100 mg by mouth daily.   . montelukast (SINGULAIR) 10 MG tablet Take 10 mg by mouth at bedtime.   [name] omeprazole (PRILOSEC) 40 MG capsule Take 40 mg by mouth daily.   No current facility-administered medications on file prior to visit.     Chief Complaint  Patient presents with  . Follow-up    Pt denies any new breathing issues since last OV. Pt states that Trelegy is not covered by insurance -- seemed to work well.     Pulmonary tests Spirometry 03/05/10>>FEV1 2.43(87%), FEV1% 93 IgE 2008>>412 IgE 04/01/11>>162.1 Xolair from 11/23/07 to 03/05/10>>stopped due to insurance issues RAST 04/01/11>>Cat dander, dog dander, dust mites PFT  05/28/11>>FEV1 3.01(112%), FEV1% 78, TLC 5.64(112%), RV 2.28(135%), DLCO 99%, positive bronchodilator response. December 2012 >> resumed xolair >> stopped Summer 2014 due to finances >> resumed September 2014 January 2016 >> xolair d/c'ed due to finances FeNO 08/19/16 >> 10 Spirometry 08/19/16 >> FEV1 2.49 (84%), FEV1% 79 Labs 08/19/16 >> IgE 120  Past medical history Migraine, GERD, HLD, Bipolar, Fibromyalgia, HH, IBS  Past surgical history, Family history, Social history, Allergies reviewed.  Vital signs BP 114/78 (BP Location: Right Arm, Cuff Size: Normal)   Pulse (!) 113   Ht 5\' 5"  (1.651 m)   Wt 163 lb 12.8 oz (74.3 kg)   SpO2 97%   BMI 27.26 kg/m   History of Present Illness: Nancy [name] is a 51 y.o. female former smoker with persistent asthma.  Her breathing has been okay recently.  She knows she will get in to trouble again in the Fall.  She had neck surgery recently.  She is not having cough, wheeze, sputum, fever, rash, or sinus congestion at this time.  She wasn't able to afford spiriva.  Her insurance didn't cover trelegy.  Physical Exam:  General - pleasant Eyes - pupils reactive ENT - no sinus tenderness, no oral exudate, no LAN, soft neck collar on Cardiac - regular, no murmur Chest - no wheeze, rales Abd - soft, non tender Ext - no edema Skin - no rashes Neuro - normal strength Psych - normal mood   CBC  Component Value Date/Time   WBC 7.7 08/19/2016 1228   RBC 4.74 08/19/2016 1228   HGB 15.1 (H) 08/19/2016 1228   HCT 44.4 08/19/2016 1228   PLT 297.0 08/19/2016 1228   MCV 93.7 08/19/2016 1228   MCH 30.7 06/29/2010 1353   MCHC 34.1 08/19/2016 1228   RDW 14.3 08/19/2016 1228   LYMPHSABS 1.5 08/19/2016 1228   MONOABS 0.6 08/19/2016 1228   EOSABS 0.2 08/19/2016 1228   BASOSABS 0.1 08/19/2016 1228    Assessment/Plan:  Severe persistent asthma. - this is difficult to control - she has eosinophilic phenotype - will continue symbicort and  singulair with prn albuterol - will try to get her set up for cinqair  Upper airway cough syndrome with allergic rhinitis and post nasal drip. - continue singulair, zyrtec   Patient Instructions  Will start the process to get approval for Cinqair to treat asthma  Follow up in 3 months   Chesley Mires, MD [facility]Critical [facility]/Sleep Pager:  [phone] 11/24/2016, 3:24 PM

## 2016-11-28 DIAGNOSIS — M542 Cervicalgia: Secondary | ICD-10-CM | POA: Diagnosis not present

## 2016-11-28 DIAGNOSIS — M5001 Cervical disc disorder with myelopathy,  high cervical region: Secondary | ICD-10-CM | POA: Diagnosis not present

## 2016-11-28 DIAGNOSIS — M50122 Cervical disc disorder at C5-C6 level with radiculopathy: Secondary | ICD-10-CM | POA: Diagnosis not present

## 2016-11-28 DIAGNOSIS — M50123 Cervical disc disorder at C6-C7 level with radiculopathy: Secondary | ICD-10-CM | POA: Diagnosis not present

## 2016-11-28 DIAGNOSIS — M50121 Cervical disc disorder at C4-C5 level with radiculopathy: Secondary | ICD-10-CM | POA: Diagnosis not present

## 2016-12-22 DIAGNOSIS — Z6829 Body mass index (BMI) 29.0-29.9, adult: Secondary | ICD-10-CM | POA: Diagnosis not present

## 2016-12-22 DIAGNOSIS — R51 Headache: Secondary | ICD-10-CM | POA: Diagnosis not present

## 2016-12-22 DIAGNOSIS — Z713 Dietary counseling and surveillance: Secondary | ICD-10-CM | POA: Diagnosis not present

## 2016-12-22 DIAGNOSIS — Z299 Encounter for prophylactic measures, unspecified: Secondary | ICD-10-CM | POA: Diagnosis not present

## 2017-01-15 ENCOUNTER — Telehealth: Payer: Self-pay | Admitting: Pulmonary Disease

## 2017-01-15 NOTE — Telephone Encounter (Signed)
[phone] is the [employer] support I have called the pt and given her this number to call and follow up on the process of this.

## 2017-01-15 NOTE — Telephone Encounter (Signed)
Called and spoke with pt and she stated that she gets lots of calls for back braces and etc.  She stated that she has told these people to stop calling her and she realized that the last person that called her was for these IV infusions for Asthma.  Pt is wanting to know if VS would be willing to set her up with these.  VS please advise.thanks

## 2017-01-15 NOTE — Telephone Encounter (Signed)
When I saw her last on 11/24/16 we started the process to get approval for cinqair.  This would be the IV therapy for asthma.  She should follow through with these calls to get this set up.

## 2017-01-21 DIAGNOSIS — M4322 Fusion of spine, cervical region: Secondary | ICD-10-CM | POA: Diagnosis not present

## 2017-01-26 DIAGNOSIS — M4322 Fusion of spine, cervical region: Secondary | ICD-10-CM | POA: Diagnosis not present

## 2017-01-26 DIAGNOSIS — M542 Cervicalgia: Secondary | ICD-10-CM | POA: Diagnosis not present

## 2017-01-26 DIAGNOSIS — M6281 Muscle weakness (generalized): Secondary | ICD-10-CM | POA: Diagnosis not present

## 2017-01-26 DIAGNOSIS — M546 Pain in thoracic spine: Secondary | ICD-10-CM | POA: Diagnosis not present

## 2017-01-27 DIAGNOSIS — M546 Pain in thoracic spine: Secondary | ICD-10-CM | POA: Diagnosis not present

## 2017-01-27 DIAGNOSIS — M542 Cervicalgia: Secondary | ICD-10-CM | POA: Diagnosis not present

## 2017-01-27 DIAGNOSIS — M6281 Muscle weakness (generalized): Secondary | ICD-10-CM | POA: Diagnosis not present

## 2017-01-27 DIAGNOSIS — M4322 Fusion of spine, cervical region: Secondary | ICD-10-CM | POA: Diagnosis not present

## 2017-01-30 DIAGNOSIS — M6281 Muscle weakness (generalized): Secondary | ICD-10-CM | POA: Diagnosis not present

## 2017-01-30 DIAGNOSIS — M546 Pain in thoracic spine: Secondary | ICD-10-CM | POA: Diagnosis not present

## 2017-01-30 DIAGNOSIS — M542 Cervicalgia: Secondary | ICD-10-CM | POA: Diagnosis not present

## 2017-01-30 DIAGNOSIS — M4322 Fusion of spine, cervical region: Secondary | ICD-10-CM | POA: Diagnosis not present

## 2017-02-02 DIAGNOSIS — M4322 Fusion of spine, cervical region: Secondary | ICD-10-CM | POA: Diagnosis not present

## 2017-02-02 DIAGNOSIS — Z299 Encounter for prophylactic measures, unspecified: Secondary | ICD-10-CM | POA: Diagnosis not present

## 2017-02-02 DIAGNOSIS — M25512 Pain in left shoulder: Secondary | ICD-10-CM | POA: Diagnosis not present

## 2017-02-02 DIAGNOSIS — K219 Gastro-esophageal reflux disease without esophagitis: Secondary | ICD-10-CM | POA: Diagnosis not present

## 2017-02-02 DIAGNOSIS — M542 Cervicalgia: Secondary | ICD-10-CM | POA: Diagnosis not present

## 2017-02-02 DIAGNOSIS — M545 Low back pain: Secondary | ICD-10-CM | POA: Diagnosis not present

## 2017-02-02 DIAGNOSIS — M546 Pain in thoracic spine: Secondary | ICD-10-CM | POA: Diagnosis not present

## 2017-02-02 DIAGNOSIS — Z6830 Body mass index (BMI) 30.0-30.9, adult: Secondary | ICD-10-CM | POA: Diagnosis not present

## 2017-02-02 DIAGNOSIS — M6281 Muscle weakness (generalized): Secondary | ICD-10-CM | POA: Diagnosis not present

## 2017-02-05 DIAGNOSIS — M542 Cervicalgia: Secondary | ICD-10-CM | POA: Diagnosis not present

## 2017-02-05 DIAGNOSIS — M4322 Fusion of spine, cervical region: Secondary | ICD-10-CM | POA: Diagnosis not present

## 2017-02-05 DIAGNOSIS — M6281 Muscle weakness (generalized): Secondary | ICD-10-CM | POA: Diagnosis not present

## 2017-02-05 DIAGNOSIS — M546 Pain in thoracic spine: Secondary | ICD-10-CM | POA: Diagnosis not present

## 2017-02-10 DIAGNOSIS — M4322 Fusion of spine, cervical region: Secondary | ICD-10-CM | POA: Diagnosis not present

## 2017-02-10 DIAGNOSIS — M546 Pain in thoracic spine: Secondary | ICD-10-CM | POA: Diagnosis not present

## 2017-02-10 DIAGNOSIS — M542 Cervicalgia: Secondary | ICD-10-CM | POA: Diagnosis not present

## 2017-02-10 DIAGNOSIS — M6281 Muscle weakness (generalized): Secondary | ICD-10-CM | POA: Diagnosis not present

## 2017-02-12 DIAGNOSIS — M6281 Muscle weakness (generalized): Secondary | ICD-10-CM | POA: Diagnosis not present

## 2017-02-12 DIAGNOSIS — M4322 Fusion of spine, cervical region: Secondary | ICD-10-CM | POA: Diagnosis not present

## 2017-02-12 DIAGNOSIS — M542 Cervicalgia: Secondary | ICD-10-CM | POA: Diagnosis not present

## 2017-02-12 DIAGNOSIS — M546 Pain in thoracic spine: Secondary | ICD-10-CM | POA: Diagnosis not present

## 2017-02-16 DIAGNOSIS — M542 Cervicalgia: Secondary | ICD-10-CM | POA: Diagnosis not present

## 2017-02-16 DIAGNOSIS — M4322 Fusion of spine, cervical region: Secondary | ICD-10-CM | POA: Diagnosis not present

## 2017-02-16 DIAGNOSIS — M546 Pain in thoracic spine: Secondary | ICD-10-CM | POA: Diagnosis not present

## 2017-02-16 DIAGNOSIS — M6281 Muscle weakness (generalized): Secondary | ICD-10-CM | POA: Diagnosis not present

## 2017-02-17 DIAGNOSIS — M4322 Fusion of spine, cervical region: Secondary | ICD-10-CM | POA: Diagnosis not present

## 2017-02-17 DIAGNOSIS — M6281 Muscle weakness (generalized): Secondary | ICD-10-CM | POA: Diagnosis not present

## 2017-02-17 DIAGNOSIS — M546 Pain in thoracic spine: Secondary | ICD-10-CM | POA: Diagnosis not present

## 2017-02-17 DIAGNOSIS — M542 Cervicalgia: Secondary | ICD-10-CM | POA: Diagnosis not present

## 2017-02-25 DIAGNOSIS — M4322 Fusion of spine, cervical region: Secondary | ICD-10-CM | POA: Diagnosis not present

## 2017-02-25 DIAGNOSIS — M6281 Muscle weakness (generalized): Secondary | ICD-10-CM | POA: Diagnosis not present

## 2017-02-25 DIAGNOSIS — M546 Pain in thoracic spine: Secondary | ICD-10-CM | POA: Diagnosis not present

## 2017-02-25 DIAGNOSIS — M542 Cervicalgia: Secondary | ICD-10-CM | POA: Diagnosis not present

## 2017-02-26 DIAGNOSIS — M4322 Fusion of spine, cervical region: Secondary | ICD-10-CM | POA: Diagnosis not present

## 2017-02-26 DIAGNOSIS — M542 Cervicalgia: Secondary | ICD-10-CM | POA: Diagnosis not present

## 2017-02-26 DIAGNOSIS — M6281 Muscle weakness (generalized): Secondary | ICD-10-CM | POA: Diagnosis not present

## 2017-02-26 DIAGNOSIS — M546 Pain in thoracic spine: Secondary | ICD-10-CM | POA: Diagnosis not present

## 2017-03-03 ENCOUNTER — Ambulatory Visit (INDEPENDENT_AMBULATORY_CARE_PROVIDER_SITE_OTHER): Payer: PPO | Admitting: Pulmonary Disease

## 2017-03-03 ENCOUNTER — Encounter: Payer: Self-pay | Admitting: Pulmonary Disease

## 2017-03-03 VITALS — BP 118/82 | HR 108 | Ht 65.0 in | Wt 173.0 lb

## 2017-03-03 DIAGNOSIS — J82 Pulmonary eosinophilia, not elsewhere classified: Secondary | ICD-10-CM

## 2017-03-03 DIAGNOSIS — Z23 Encounter for immunization: Secondary | ICD-10-CM

## 2017-03-03 DIAGNOSIS — J8283 Eosinophilic asthma: Secondary | ICD-10-CM

## 2017-03-03 DIAGNOSIS — J3089 Other allergic rhinitis: Secondary | ICD-10-CM | POA: Diagnosis not present

## 2017-03-03 DIAGNOSIS — J4550 Severe persistent asthma, uncomplicated: Secondary | ICD-10-CM | POA: Diagnosis not present

## 2017-03-03 DIAGNOSIS — R05 Cough: Secondary | ICD-10-CM

## 2017-03-03 DIAGNOSIS — M6281 Muscle weakness (generalized): Secondary | ICD-10-CM | POA: Diagnosis not present

## 2017-03-03 DIAGNOSIS — M542 Cervicalgia: Secondary | ICD-10-CM | POA: Diagnosis not present

## 2017-03-03 DIAGNOSIS — R058 Other specified cough: Secondary | ICD-10-CM

## 2017-03-03 DIAGNOSIS — M4322 Fusion of spine, cervical region: Secondary | ICD-10-CM | POA: Diagnosis not present

## 2017-03-03 DIAGNOSIS — M546 Pain in thoracic spine: Secondary | ICD-10-CM | POA: Diagnosis not present

## 2017-03-03 NOTE — Patient Instructions (Signed)
Flu shot today  Will check on status of application to get started on cinqair  Follow up in 6 months
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
## 2017-03-05 DIAGNOSIS — M542 Cervicalgia: Secondary | ICD-10-CM | POA: Diagnosis not present

## 2017-03-05 DIAGNOSIS — M546 Pain in thoracic spine: Secondary | ICD-10-CM | POA: Diagnosis not present

## 2017-03-05 DIAGNOSIS — M4322 Fusion of spine, cervical region: Secondary | ICD-10-CM | POA: Diagnosis not present

## 2017-03-05 DIAGNOSIS — M6281 Muscle weakness (generalized): Secondary | ICD-10-CM | POA: Diagnosis not present

## 2017-03-10 DIAGNOSIS — M546 Pain in thoracic spine: Secondary | ICD-10-CM | POA: Diagnosis not present

## 2017-03-10 DIAGNOSIS — M4322 Fusion of spine, cervical region: Secondary | ICD-10-CM | POA: Diagnosis not present

## 2017-03-10 DIAGNOSIS — M6281 Muscle weakness (generalized): Secondary | ICD-10-CM | POA: Diagnosis not present

## 2017-03-10 DIAGNOSIS — M542 Cervicalgia: Secondary | ICD-10-CM | POA: Diagnosis not present

## 2017-03-12 DIAGNOSIS — M25512 Pain in left shoulder: Secondary | ICD-10-CM | POA: Diagnosis not present

## 2017-03-12 DIAGNOSIS — Z6830 Body mass index (BMI) 30.0-30.9, adult: Secondary | ICD-10-CM | POA: Diagnosis not present

## 2017-03-12 DIAGNOSIS — Z299 Encounter for prophylactic measures, unspecified: Secondary | ICD-10-CM | POA: Diagnosis not present

## 2017-03-16 DIAGNOSIS — G894 Chronic pain syndrome: Secondary | ICD-10-CM | POA: Diagnosis not present

## 2017-03-16 DIAGNOSIS — M542 Cervicalgia: Secondary | ICD-10-CM | POA: Diagnosis not present

## 2017-03-16 DIAGNOSIS — M5106 Intervertebral disc disorders with myelopathy, lumbar region: Secondary | ICD-10-CM | POA: Diagnosis not present

## 2017-03-16 DIAGNOSIS — M545 Low back pain: Secondary | ICD-10-CM | POA: Diagnosis not present

## 2017-03-16 DIAGNOSIS — M4322 Fusion of spine, cervical region: Secondary | ICD-10-CM | POA: Diagnosis not present

## 2017-03-17 DIAGNOSIS — M542 Cervicalgia: Secondary | ICD-10-CM | POA: Diagnosis not present

## 2017-03-17 DIAGNOSIS — M546 Pain in thoracic spine: Secondary | ICD-10-CM | POA: Diagnosis not present

## 2017-03-17 DIAGNOSIS — M6281 Muscle weakness (generalized): Secondary | ICD-10-CM | POA: Diagnosis not present

## 2017-03-17 DIAGNOSIS — M4322 Fusion of spine, cervical region: Secondary | ICD-10-CM | POA: Diagnosis not present

## 2017-03-18 DIAGNOSIS — E663 Overweight: Secondary | ICD-10-CM | POA: Diagnosis not present

## 2017-03-18 DIAGNOSIS — J069 Acute upper respiratory infection, unspecified: Secondary | ICD-10-CM | POA: Diagnosis not present

## 2017-03-18 DIAGNOSIS — Z87891 Personal history of nicotine dependence: Secondary | ICD-10-CM | POA: Diagnosis not present

## 2017-03-18 DIAGNOSIS — R509 Fever, unspecified: Secondary | ICD-10-CM | POA: Diagnosis not present

## 2017-03-23 DIAGNOSIS — Z713 Dietary counseling and surveillance: Secondary | ICD-10-CM | POA: Diagnosis not present

## 2017-03-23 DIAGNOSIS — R0989 Other specified symptoms and signs involving the circulatory and respiratory systems: Secondary | ICD-10-CM | POA: Diagnosis not present

## 2017-03-23 DIAGNOSIS — E663 Overweight: Secondary | ICD-10-CM | POA: Diagnosis not present

## 2017-03-23 DIAGNOSIS — R05 Cough: Secondary | ICD-10-CM | POA: Diagnosis not present

## 2017-03-23 DIAGNOSIS — R0602 Shortness of breath: Secondary | ICD-10-CM | POA: Diagnosis not present

## 2017-03-23 DIAGNOSIS — Z299 Encounter for prophylactic measures, unspecified: Secondary | ICD-10-CM | POA: Diagnosis not present

## 2017-03-23 DIAGNOSIS — Z87891 Personal history of nicotine dependence: Secondary | ICD-10-CM | POA: Diagnosis not present

## 2017-03-23 DIAGNOSIS — J45909 Unspecified asthma, uncomplicated: Secondary | ICD-10-CM | POA: Diagnosis not present

## 2017-03-30 DIAGNOSIS — M5106 Intervertebral disc disorders with myelopathy, lumbar region: Secondary | ICD-10-CM | POA: Diagnosis not present

## 2017-03-30 DIAGNOSIS — M4322 Fusion of spine, cervical region: Secondary | ICD-10-CM | POA: Diagnosis not present

## 2017-03-30 DIAGNOSIS — M791 Myalgia, unspecified site: Secondary | ICD-10-CM | POA: Diagnosis not present

## 2017-03-30 DIAGNOSIS — G894 Chronic pain syndrome: Secondary | ICD-10-CM | POA: Diagnosis not present

## 2017-03-30 DIAGNOSIS — M545 Low back pain: Secondary | ICD-10-CM | POA: Diagnosis not present

## 2017-03-30 DIAGNOSIS — M532X6 Spinal instabilities, lumbar region: Secondary | ICD-10-CM | POA: Diagnosis not present

## 2017-04-23 ENCOUNTER — Telehealth: Payer: Self-pay | Admitting: Pulmonary Disease

## 2017-04-23 DIAGNOSIS — Z6829 Body mass index (BMI) 29.0-29.9, adult: Secondary | ICD-10-CM | POA: Diagnosis not present

## 2017-04-23 DIAGNOSIS — Z299 Encounter for prophylactic measures, unspecified: Secondary | ICD-10-CM | POA: Diagnosis not present

## 2017-04-23 DIAGNOSIS — Z713 Dietary counseling and surveillance: Secondary | ICD-10-CM | POA: Diagnosis not present

## 2017-04-23 DIAGNOSIS — B029 Zoster without complications: Secondary | ICD-10-CM | POA: Diagnosis not present

## 2017-04-23 MED ORDER — BUDESONIDE-FORMOTEROL FUMARATE 160-4.5 MCG/ACT IN AERO: INHALATION_SPRAY | RESPIRATORY_TRACT | 11 refills | Status: DC

## 2017-04-23 NOTE — Telephone Encounter (Signed)
Rx for symbicort 160 has been sent to preferred pharmacy. Pt is aware and voiced her understanding.  Nothing further needed.

## 2017-06-16 DIAGNOSIS — Z79899 Other long term (current) drug therapy: Secondary | ICD-10-CM | POA: Diagnosis not present

## 2017-06-16 DIAGNOSIS — M5106 Intervertebral disc disorders with myelopathy, lumbar region: Secondary | ICD-10-CM | POA: Diagnosis not present

## 2017-06-16 DIAGNOSIS — G894 Chronic pain syndrome: Secondary | ICD-10-CM | POA: Diagnosis not present

## 2017-06-16 DIAGNOSIS — M4322 Fusion of spine, cervical region: Secondary | ICD-10-CM | POA: Diagnosis not present

## 2017-06-22 DIAGNOSIS — Z6829 Body mass index (BMI) 29.0-29.9, adult: Secondary | ICD-10-CM | POA: Diagnosis not present

## 2017-06-22 DIAGNOSIS — Z713 Dietary counseling and surveillance: Secondary | ICD-10-CM | POA: Diagnosis not present

## 2017-06-22 DIAGNOSIS — G4700 Insomnia, unspecified: Secondary | ICD-10-CM | POA: Diagnosis not present

## 2017-06-22 DIAGNOSIS — Z299 Encounter for prophylactic measures, unspecified: Secondary | ICD-10-CM | POA: Diagnosis not present

## 2017-07-02 DIAGNOSIS — M79671 Pain in right foot: Secondary | ICD-10-CM | POA: Diagnosis not present

## 2017-07-02 DIAGNOSIS — M722 Plantar fascial fibromatosis: Secondary | ICD-10-CM | POA: Diagnosis not present

## 2017-07-07 ENCOUNTER — Other Ambulatory Visit: Payer: Self-pay | Admitting: Orthopaedic Surgery

## 2017-07-07 DIAGNOSIS — G894 Chronic pain syndrome: Secondary | ICD-10-CM

## 2017-07-12 ENCOUNTER — Ambulatory Visit
Admission: RE | Admit: 2017-07-12 | Discharge: 2017-07-12 | Disposition: A | Discharge status: Home / Self Care | Payer: PPO | Source: Ambulatory Visit | Attending: Orthopaedic Surgery | Admitting: Orthopaedic Surgery

## 2017-07-12 DIAGNOSIS — M5124 Other intervertebral disc displacement, thoracic region: Secondary | ICD-10-CM | POA: Diagnosis not present

## 2017-07-12 DIAGNOSIS — G894 Chronic pain syndrome: Secondary | ICD-10-CM

## 2017-07-20 DIAGNOSIS — Z6829 Body mass index (BMI) 29.0-29.9, adult: Secondary | ICD-10-CM | POA: Diagnosis not present

## 2017-07-20 DIAGNOSIS — Z299 Encounter for prophylactic measures, unspecified: Secondary | ICD-10-CM | POA: Diagnosis not present

## 2017-07-20 DIAGNOSIS — Z87891 Personal history of nicotine dependence: Secondary | ICD-10-CM | POA: Diagnosis not present

## 2017-07-20 DIAGNOSIS — J329 Chronic sinusitis, unspecified: Secondary | ICD-10-CM | POA: Diagnosis not present

## 2017-07-20 DIAGNOSIS — Z713 Dietary counseling and surveillance: Secondary | ICD-10-CM | POA: Diagnosis not present

## 2017-07-23 DIAGNOSIS — M79671 Pain in right foot: Secondary | ICD-10-CM | POA: Diagnosis not present

## 2017-07-23 DIAGNOSIS — M722 Plantar fascial fibromatosis: Secondary | ICD-10-CM | POA: Diagnosis not present

## 2017-07-29 DIAGNOSIS — Z87891 Personal history of nicotine dependence: Secondary | ICD-10-CM | POA: Diagnosis not present

## 2017-07-29 DIAGNOSIS — J45909 Unspecified asthma, uncomplicated: Secondary | ICD-10-CM | POA: Diagnosis not present

## 2017-07-29 DIAGNOSIS — Z299 Encounter for prophylactic measures, unspecified: Secondary | ICD-10-CM | POA: Diagnosis not present

## 2017-07-29 DIAGNOSIS — J101 Influenza due to other identified influenza virus with other respiratory manifestations: Secondary | ICD-10-CM | POA: Diagnosis not present

## 2017-07-29 DIAGNOSIS — Z6829 Body mass index (BMI) 29.0-29.9, adult: Secondary | ICD-10-CM | POA: Diagnosis not present

## 2017-07-29 DIAGNOSIS — R509 Fever, unspecified: Secondary | ICD-10-CM | POA: Diagnosis not present

## 2017-08-13 DIAGNOSIS — M546 Pain in thoracic spine: Secondary | ICD-10-CM | POA: Diagnosis not present

## 2017-08-13 DIAGNOSIS — M5126 Other intervertebral disc displacement, lumbar region: Secondary | ICD-10-CM | POA: Diagnosis not present

## 2017-08-31 ENCOUNTER — Ambulatory Visit (INDEPENDENT_AMBULATORY_CARE_PROVIDER_SITE_OTHER): Payer: PPO | Admitting: Pulmonary Disease

## 2017-08-31 ENCOUNTER — Encounter: Payer: Self-pay | Admitting: Pulmonary Disease

## 2017-08-31 VITALS — BP 118/72 | HR 98 | Ht 65.0 in | Wt 170.0 lb

## 2017-08-31 DIAGNOSIS — J82 Pulmonary eosinophilia, not elsewhere classified: Secondary | ICD-10-CM | POA: Diagnosis not present

## 2017-08-31 DIAGNOSIS — J4550 Severe persistent asthma, uncomplicated: Secondary | ICD-10-CM

## 2017-08-31 DIAGNOSIS — J3089 Other allergic rhinitis: Secondary | ICD-10-CM

## 2017-08-31 DIAGNOSIS — R05 Cough: Secondary | ICD-10-CM

## 2017-08-31 DIAGNOSIS — Z1231 Encounter for screening mammogram for malignant neoplasm of breast: Secondary | ICD-10-CM | POA: Diagnosis not present

## 2017-08-31 DIAGNOSIS — R058 Other specified cough: Secondary | ICD-10-CM

## 2017-08-31 DIAGNOSIS — J8283 Eosinophilic asthma: Secondary | ICD-10-CM

## 2017-08-31 NOTE — Progress Notes (Signed)
Nancy Nancy [name], Critical Care, and Sleep Medicine  Chief Complaint  Patient presents with  . Follow-up    6 month ROV     Vital signs: BP 118/72 (BP Location: Left Arm, Cuff Size: Normal)   Pulse 98   Ht 5\' 5"  (1.651 m)   Wt 170 lb (77.1 kg)   SpO2 98%   BMI 28.29 kg/m   History of Present Illness: Nancy Nancy [name] is a 52 y.o. female former smoker with allergic asthma with eosinophilic phenotype.  She was diagnosed with the flu about 4 weeks ago.  Has dry cough still.  Not having as much wheeze.  Sinuses acting up again.  No fever, chest pain, hemoptysis, or skin rash.  Using albuterol several times per week.  Physical Exam:  General - pleasant Eyes - pupils reactive ENT - no sinus tenderness, no oral exudate, no LAN Cardiac - regular, no murmur Chest - no wheeze, rales Abd - soft, non tender Ext - no edema Skin - no rashes Neuro - normal strength Psych - normal mood   Assessment/Plan:  Severe, persistent allergic asthma with eosinophilic phenotype and upper airway cough from allergic rhinitis. - continue symbicort, singulair, zyrtec - will try to set up fasenra   Patient Instructions  Will start the process to get fasenra approved  Follow up in 4 months    Nancy Mires, MD [place] 08/31/2017, 5:01 PM Pager:  [phone]  Flow Sheet  Nancy [name] tests: Spirometry 03/05/10>>FEV1 2.43(87%), FEV1% 93 IgE 2008>>412 IgE 04/01/11>>162.1 Xolair from 11/23/07 to 03/05/10>>stopped due to insurance issues RAST 04/01/11>>Cat dander, dog dander, dust mites PFT 05/28/11>>FEV1 3.01(112%), FEV1% 78, TLC 5.64(112%), RV 2.28(135%), DLCO 99%, positive bronchodilator response. December 2012 >> resumed xolair >> stopped Summer 2014 due to finances >> resumed September 2014 January 2016 >> xolair d/c'ed due to finances FeNO 08/19/16 >> 10 Spirometry 08/19/16 >> FEV1 2.49 (84%), FEV1% 79 Labs 08/19/16 >> IgE 120  Past Medical History: She  has a  past medical history of Allergic rhinitis, Asthma, Benign familial tremor, Bipolar 1 disorder ([place]), Chronic neck and back pain, Degenerative disk disease, Dyslipidemia, Fibromyalgia, GERD (gastroesophageal reflux disease), Hiatal hernia, Irritable bowel syndrome (IBS), Memory disorder (07/18/2014), Memory loss, Migraine headache, Nephrolithiasis, Pneumonia (1986), and Serotonin syndrome.  Past Surgical History: She  has a past surgical history that includes Laparoscopic cholecystectomy; MICRODISSECTION L5-S1 (August 13, 2000); Appendectomy; Tonsillectomy; back fusion; Ankle fracture surgery (2006); Esophageal dilation (August 06, 2004); Cardiac catheterization (November 27, 2006); Bronchoscopy; Laser ablation of the cervix; and Back surgery.  Family History: Her family history includes Allergies in her brother, father, and mother; Chorea in her maternal grandmother; Clotting disorder in her mother; Heart disease in her father.  Social History: She  reports that she quit smoking about 39 years ago. Her smoking use included cigarettes. She has a 4.00 pack-year smoking history. She has never used smokeless tobacco. She reports that she does not drink alcohol or use drugs.  Medications: Allergies as of 08/31/2017      Reactions   Cephalexin    Cephalosporins    Cymbalta [duloxetine Hcl]    Divalproex Sodium    Effexor [venlafaxine Hydrochloride]    Geodon [ziprasidone Hydrochloride]    Hydrocodone    Pt states she is able to take in liquid/hydromet form   Prozac [fluoxetine Hcl]    Theo-dur [theophylline]    Thorazine [chlorpromazine Hcl]    Topamax       Medication List  Accurate as of 08/31/17  5:01 PM. Always use your most recent med list.          albuterol (2.5 MG/3ML) 0.083% nebulizer solution Commonly known as:  PROVENTIL Take 2.5 mg by nebulization every 6 (six) hours as needed.   albuterol 108 (90 Base) MCG/ACT inhaler Commonly known as:  PROVENTIL HFA;VENTOLIN  HFA Inhale 2 puffs into the lungs every 6 (six) hours as needed.   atorvastatin 10 MG tablet Commonly known as:  LIPITOR Take 10 mg by mouth daily.   budesonide-formoterol 160-4.5 MCG/ACT inhaler Commonly known as:  SYMBICORT INHALE 2 PUFFS INTO THE LUNGS 2 (TWO) TIMES DAILY.   cetirizine 10 MG tablet Commonly known as:  ZYRTEC Take 10 mg by mouth daily.   cyclobenzaprine 10 MG tablet Commonly known as:  FLEXERIL Take 10 mg by mouth 2 (two) times daily as needed for muscle spasms.   docusate sodium 100 MG capsule Commonly known as:  COLACE Take 200 mg by mouth daily.   HYDROcodone-acetaminophen 10-325 MG tablet Commonly known as:  NORCO Take 1 tablet by mouth 3 (three) times daily.   HYDROcodone-homatropine 5-1.5 MG/5ML syrup Commonly known as:  HYDROMET Take 5 mLs by mouth every 4 (four) hours as needed for cough.   lamoTRIgine 200 MG tablet Commonly known as:  LAMICTAL Take 100 mg by mouth daily.   montelukast 10 MG tablet Commonly known as:  SINGULAIR Take 10 mg by mouth at bedtime.   omeprazole 40 MG capsule Commonly known as:  PRILOSEC Take 40 mg by mouth daily.   TEGRETOL-XR 100 MG 12 hr tablet Generic drug:  carbamazepine Take 300 mg by mouth at bedtime.

## 2017-08-31 NOTE — Patient Instructions (Signed)
Will start the process to get fasenra approved  Follow up in 4 months

## 2017-09-04 ENCOUNTER — Telehealth: Payer: Self-pay | Admitting: Pulmonary Disease

## 2017-09-04 NOTE — Telephone Encounter (Signed)
Med is [employer], [employer].

## 2017-09-07 NOTE — Telephone Encounter (Addendum)
Yes, we did.

## 2017-09-16 NOTE — Telephone Encounter (Signed)
Tammy, please advise if this encounter can be closed. Thanks!

## 2017-09-17 DIAGNOSIS — G4700 Insomnia, unspecified: Secondary | ICD-10-CM | POA: Diagnosis not present

## 2017-09-17 DIAGNOSIS — R5383 Other fatigue: Secondary | ICD-10-CM | POA: Diagnosis not present

## 2017-09-17 DIAGNOSIS — Z1339 Encounter for screening examination for other mental health and behavioral disorders: Secondary | ICD-10-CM | POA: Diagnosis not present

## 2017-09-17 DIAGNOSIS — K219 Gastro-esophageal reflux disease without esophagitis: Secondary | ICD-10-CM | POA: Diagnosis not present

## 2017-09-17 DIAGNOSIS — Z299 Encounter for prophylactic measures, unspecified: Secondary | ICD-10-CM | POA: Diagnosis not present

## 2017-09-17 DIAGNOSIS — Z79899 Other long term (current) drug therapy: Secondary | ICD-10-CM | POA: Diagnosis not present

## 2017-09-17 DIAGNOSIS — Z0000 Encounter for general adult medical examination without abnormal findings: Secondary | ICD-10-CM | POA: Diagnosis not present

## 2017-09-17 DIAGNOSIS — F329 Major depressive disorder, single episode, unspecified: Secondary | ICD-10-CM | POA: Diagnosis not present

## 2017-09-17 DIAGNOSIS — Z7189 Other specified counseling: Secondary | ICD-10-CM | POA: Diagnosis not present

## 2017-09-17 DIAGNOSIS — Z1331 Encounter for screening for depression: Secondary | ICD-10-CM | POA: Diagnosis not present

## 2017-09-17 DIAGNOSIS — Z6829 Body mass index (BMI) 29.0-29.9, adult: Secondary | ICD-10-CM | POA: Diagnosis not present

## 2017-09-17 DIAGNOSIS — E7800 Pure hypercholesterolemia, unspecified: Secondary | ICD-10-CM | POA: Diagnosis not present

## 2017-09-22 ENCOUNTER — Telehealth: Payer: Self-pay | Admitting: Pulmonary Disease

## 2017-09-22 NOTE — Telephone Encounter (Signed)
Attempted to call pt but no answer. Stated to pt I was going to be sending this to Alroy Bailiff for her to follow up on.  Routing message to [employer].

## 2017-09-22 NOTE — Telephone Encounter (Signed)
Nancy [name] is going to contact pt.[name]

## 2017-09-23 NOTE — Telephone Encounter (Signed)
Spoke with patient.  Mailed her the paperwork to fill out and send back to [place].

## 2017-09-28 DIAGNOSIS — E875 Hyperkalemia: Secondary | ICD-10-CM | POA: Diagnosis not present

## 2017-09-29 NOTE — Telephone Encounter (Signed)
Spoke with pt. She is going to call [place] to see if she qualifies. If so I fill out our portion of the form (VS needs to sign it, Vida Roller has it.) and I'm sending the app to the pt.[name]

## 2017-09-29 NOTE — Telephone Encounter (Signed)
Called pt. Nancy [name], waiting for pt tcb.[name]

## 2017-09-29 NOTE — Telephone Encounter (Signed)
Pt. Has not returned [place] paperwork. Will call pt. Today and see if she received it.

## 2017-09-29 NOTE — Telephone Encounter (Signed)
Tammy can you please give an update on this? Thanks.

## 2017-10-02 DIAGNOSIS — Z299 Encounter for prophylactic measures, unspecified: Secondary | ICD-10-CM | POA: Diagnosis not present

## 2017-10-02 DIAGNOSIS — J45909 Unspecified asthma, uncomplicated: Secondary | ICD-10-CM | POA: Diagnosis not present

## 2017-10-02 DIAGNOSIS — Z6830 Body mass index (BMI) 30.0-30.9, adult: Secondary | ICD-10-CM | POA: Diagnosis not present

## 2017-10-02 DIAGNOSIS — Z87891 Personal history of nicotine dependence: Secondary | ICD-10-CM | POA: Diagnosis not present

## 2017-10-02 DIAGNOSIS — Z713 Dietary counseling and surveillance: Secondary | ICD-10-CM | POA: Diagnosis not present

## 2017-10-02 DIAGNOSIS — J069 Acute upper respiratory infection, unspecified: Secondary | ICD-10-CM | POA: Diagnosis not present

## 2017-10-02 NOTE — Telephone Encounter (Signed)
Tem[place] from [facility] called stating they do not have the whole application, Only has pg 3 and copy of med card. They need proof of income and she is going to fax over the product request form  CB #[id number] option 1

## 2017-10-02 NOTE — Telephone Encounter (Signed)
Spoke with [employer]-they need [place] to send pages 4 and 5 as well as the product request form.    LMTCB for pt-need her to come by and sign forms-then fax back.

## 2017-10-05 DIAGNOSIS — J302 Other seasonal allergic rhinitis: Secondary | ICD-10-CM | POA: Diagnosis not present

## 2017-10-05 DIAGNOSIS — R0989 Other specified symptoms and signs involving the circulatory and respiratory systems: Secondary | ICD-10-CM | POA: Diagnosis not present

## 2017-10-05 DIAGNOSIS — Z6830 Body mass index (BMI) 30.0-30.9, adult: Secondary | ICD-10-CM | POA: Diagnosis not present

## 2017-10-05 DIAGNOSIS — Z299 Encounter for prophylactic measures, unspecified: Secondary | ICD-10-CM | POA: Diagnosis not present

## 2017-10-05 DIAGNOSIS — R05 Cough: Secondary | ICD-10-CM | POA: Diagnosis not present

## 2017-10-05 DIAGNOSIS — Z87891 Personal history of nicotine dependence: Secondary | ICD-10-CM | POA: Diagnosis not present

## 2017-10-05 DIAGNOSIS — J45909 Unspecified asthma, uncomplicated: Secondary | ICD-10-CM | POA: Diagnosis not present

## 2017-10-05 DIAGNOSIS — J42 Unspecified chronic bronchitis: Secondary | ICD-10-CM | POA: Diagnosis not present

## 2017-10-05 NOTE — Telephone Encounter (Signed)
Patient called and stated she faxed forms to [employer] and Me today.  Per Alroy Bailiff, advised her to fax those to our office and she states she will fax these tomorrow, 10/06/17.

## 2017-10-05 NOTE — Telephone Encounter (Signed)
Noted. Thanks.

## 2017-10-06 DIAGNOSIS — M4322 Fusion of spine, cervical region: Secondary | ICD-10-CM | POA: Diagnosis not present

## 2017-10-06 DIAGNOSIS — M5126 Other intervertebral disc displacement, lumbar region: Secondary | ICD-10-CM | POA: Diagnosis not present

## 2017-10-06 DIAGNOSIS — G894 Chronic pain syndrome: Secondary | ICD-10-CM | POA: Diagnosis not present

## 2017-10-06 DIAGNOSIS — Z6830 Body mass index (BMI) 30.0-30.9, adult: Secondary | ICD-10-CM | POA: Diagnosis not present

## 2017-10-06 NOTE — Telephone Encounter (Signed)
Pt. Came in and gave me her [place] app. And her tax info.. I copied everything. I will fax it all once VS signs rx.

## 2017-10-07 NOTE — Telephone Encounter (Signed)
[employer] Me application completed and faxed back today.

## 2017-10-09 DIAGNOSIS — J302 Other seasonal allergic rhinitis: Secondary | ICD-10-CM | POA: Diagnosis not present

## 2017-10-09 DIAGNOSIS — Z299 Encounter for prophylactic measures, unspecified: Secondary | ICD-10-CM | POA: Diagnosis not present

## 2017-10-09 DIAGNOSIS — Z6830 Body mass index (BMI) 30.0-30.9, adult: Secondary | ICD-10-CM | POA: Diagnosis not present

## 2017-10-09 DIAGNOSIS — J209 Acute bronchitis, unspecified: Secondary | ICD-10-CM | POA: Diagnosis not present

## 2017-10-09 DIAGNOSIS — J45909 Unspecified asthma, uncomplicated: Secondary | ICD-10-CM | POA: Diagnosis not present

## 2017-10-13 NOTE — Telephone Encounter (Signed)
Tammy can you please a give an update on this? Thanks.

## 2017-10-13 NOTE — Telephone Encounter (Signed)
Faxed failed, everything was faxed again. Received confirmation the fax went through this time. I haven't heard from [place], I'll call to check the status of pt's [place] application.

## 2017-10-19 NOTE — Telephone Encounter (Signed)
Tammy could you please give an update on this? Thanks.

## 2017-10-27 NOTE — Telephone Encounter (Signed)
Tammy is this still being worked on or can we close the encounter? Thank you!

## 2017-10-29 NOTE — Telephone Encounter (Signed)
Tammy please advise on this matter. Thanks. 

## 2017-10-30 NOTE — Telephone Encounter (Signed)
[name] and I have been taking care of everything one at a time.We haven't gotten to her yet. Will look into it Tues.[name]

## 2017-11-04 NOTE — Telephone Encounter (Signed)
Spoke with [employer]-states they need application with tax information and Product request form faxed back today. We have faxed everything as requested and will contact tomorrow morning to have application reviewed STAT.

## 2017-11-06 NOTE — Telephone Encounter (Signed)
Tammy please advise if this has been taken care of. Thanks. 

## 2017-11-09 DIAGNOSIS — M545 Low back pain: Secondary | ICD-10-CM | POA: Diagnosis not present

## 2017-11-09 DIAGNOSIS — M546 Pain in thoracic spine: Secondary | ICD-10-CM | POA: Diagnosis not present

## 2017-11-09 DIAGNOSIS — G894 Chronic pain syndrome: Secondary | ICD-10-CM | POA: Diagnosis not present

## 2017-11-10 ENCOUNTER — Telehealth: Payer: Self-pay | Admitting: Pulmonary Disease

## 2017-11-10 NOTE — Telephone Encounter (Signed)
Will route to Tammy Scott to follow up 

## 2017-11-10 NOTE — Telephone Encounter (Signed)
Nancy [name] Prescriptiion Savings Program. Pt was approved for the program until 06/08/18 [id number] Option #1 Pt needs Medicare and A and B Card

## 2017-11-11 ENCOUNTER — Telehealth: Payer: Self-pay | Admitting: Pulmonary Disease

## 2017-11-11 NOTE — Telephone Encounter (Signed)
Noted. Will put order in fasenra smart text. Nothing further needed.

## 2017-11-11 NOTE — Telephone Encounter (Signed)
Error

## 2017-11-11 NOTE — Telephone Encounter (Signed)
Arrival Date: 11/11/17 Lot #: [id number] Exp date: 02/2019

## 2017-11-11 NOTE — Telephone Encounter (Signed)
Called [facility], they gave me a start date: 11/11/17 and a tracking #: [id number], her fasenra is being shipped today. It will arrive tomorrow. Nothing further needed.

## 2017-11-11 NOTE — Telephone Encounter (Signed)
1 prefilled syringe Ordered date: 11/10/17 Shipping date: 11/10/17

## 2017-11-17 DIAGNOSIS — J029 Acute pharyngitis, unspecified: Secondary | ICD-10-CM | POA: Diagnosis not present

## 2017-11-18 ENCOUNTER — Telehealth: Payer: Self-pay | Admitting: *Deleted

## 2017-11-18 NOTE — Telephone Encounter (Signed)
Called pt, she is leaving for [facility]. Sun.. Pt said she would call back tomorrow and let me know when she can come in. Will wait for pt. To call.

## 2017-11-19 DIAGNOSIS — Z299 Encounter for prophylactic measures, unspecified: Secondary | ICD-10-CM | POA: Diagnosis not present

## 2017-11-19 DIAGNOSIS — Z6828 Body mass index (BMI) 28.0-28.9, adult: Secondary | ICD-10-CM | POA: Diagnosis not present

## 2017-11-19 DIAGNOSIS — Z87891 Personal history of nicotine dependence: Secondary | ICD-10-CM | POA: Diagnosis not present

## 2017-11-19 DIAGNOSIS — J029 Acute pharyngitis, unspecified: Secondary | ICD-10-CM | POA: Diagnosis not present

## 2017-11-19 DIAGNOSIS — A6000 Herpesviral infection of urogenital system, unspecified: Secondary | ICD-10-CM | POA: Diagnosis not present

## 2017-11-20 MED ORDER — EPINEPHRINE 0.3 MG/0.3ML IJ SOAJ: 0.3000 mg | Freq: Once | INTRAMUSCULAR | 11 refills | Status: AC

## 2017-11-20 NOTE — Telephone Encounter (Signed)
Nothing further needed 

## 2017-11-20 NOTE — Telephone Encounter (Signed)
Pt called today and set up her appt. For 12/03/17 at 2:30. Going to call in Epi-pen now. Rx sent.

## 2017-12-03 ENCOUNTER — Ambulatory Visit: Payer: PPO

## 2017-12-04 ENCOUNTER — Telehealth: Payer: Self-pay | Admitting: Pulmonary Disease

## 2017-12-04 ENCOUNTER — Ambulatory Visit (INDEPENDENT_AMBULATORY_CARE_PROVIDER_SITE_OTHER): Payer: PPO

## 2017-12-04 DIAGNOSIS — J4550 Severe persistent asthma, uncomplicated: Secondary | ICD-10-CM | POA: Diagnosis not present

## 2017-12-04 NOTE — Telephone Encounter (Signed)
This was pt's 1st Fasenra inj.. I monitored her for 2 hrs..The 1st 20 mins she had a pinhead size reaction on the site. The next time I went out there the reaction faded away. Pt. Did not have a headache or a sore throat before she came here or when she left. Pt made her next appt.. Nothing further needed.

## 2017-12-07 MED ORDER — BENRALIZUMAB 30 MG/ML ~~LOC~~ SOSY
30.0000 mg | PREFILLED_SYRINGE | Freq: Once | SUBCUTANEOUS | Status: AC
  Administered 2017-12-04: 30 mg via SUBCUTANEOUS

## 2017-12-07 NOTE — Progress Notes (Signed)
Documentation of medication administration and charges of Berna Bue have been completed by Desmond Dike, CMA based on the [facility] documentation sheet completed by Alroy Bailiff.

## 2017-12-17 DIAGNOSIS — Z299 Encounter for prophylactic measures, unspecified: Secondary | ICD-10-CM | POA: Diagnosis not present

## 2017-12-17 DIAGNOSIS — J45909 Unspecified asthma, uncomplicated: Secondary | ICD-10-CM | POA: Diagnosis not present

## 2017-12-17 DIAGNOSIS — Z6828 Body mass index (BMI) 28.0-28.9, adult: Secondary | ICD-10-CM | POA: Diagnosis not present

## 2017-12-17 DIAGNOSIS — G4700 Insomnia, unspecified: Secondary | ICD-10-CM | POA: Diagnosis not present

## 2017-12-17 DIAGNOSIS — E7800 Pure hypercholesterolemia, unspecified: Secondary | ICD-10-CM | POA: Diagnosis not present

## 2017-12-30 ENCOUNTER — Telehealth: Payer: Self-pay | Admitting: Pulmonary Disease

## 2017-12-30 NOTE — Telephone Encounter (Signed)
1 prefilled syringe Ordered date: 12/30/17 Shipping date:  Will be shipped within 3 to 5 business days. Spoke with assoc. She said she could put on the order need by 01/01/18.

## 2017-12-31 NOTE — Telephone Encounter (Signed)
Arrival Date:12/31/17 Lot #: [id number] Exp date: 12/2018

## 2018-01-04 ENCOUNTER — Ambulatory Visit (INDEPENDENT_AMBULATORY_CARE_PROVIDER_SITE_OTHER): Payer: PPO

## 2018-01-04 DIAGNOSIS — Z79899 Other long term (current) drug therapy: Secondary | ICD-10-CM | POA: Diagnosis not present

## 2018-01-04 DIAGNOSIS — J4550 Severe persistent asthma, uncomplicated: Secondary | ICD-10-CM | POA: Diagnosis not present

## 2018-01-04 DIAGNOSIS — Z79891 Long term (current) use of opiate analgesic: Secondary | ICD-10-CM | POA: Diagnosis not present

## 2018-01-04 DIAGNOSIS — M546 Pain in thoracic spine: Secondary | ICD-10-CM | POA: Diagnosis not present

## 2018-01-04 DIAGNOSIS — G894 Chronic pain syndrome: Secondary | ICD-10-CM | POA: Diagnosis not present

## 2018-01-04 DIAGNOSIS — M545 Low back pain: Secondary | ICD-10-CM | POA: Diagnosis not present

## 2018-01-05 MED ORDER — BENRALIZUMAB 30 MG/ML ~~LOC~~ SOSY
30.0000 mg | PREFILLED_SYRINGE | Freq: Once | SUBCUTANEOUS | Status: AC
  Administered 2018-01-04: 30 mg via SUBCUTANEOUS

## 2018-01-05 NOTE — Progress Notes (Signed)
Documentation of medication administration and charges of Berna Bue have been completed by Desmond Dike, CMA based on the [facility] documentation sheet completed by Alroy Bailiff.

## 2018-01-28 ENCOUNTER — Telehealth: Payer: Self-pay | Admitting: Pulmonary Disease

## 2018-01-28 NOTE — Telephone Encounter (Signed)
1 prefilled syringe Ordered date: 01/28/18 Shipping date: 3 to 5 business days.

## 2018-02-01 ENCOUNTER — Ambulatory Visit: Payer: PPO

## 2018-02-03 NOTE — Telephone Encounter (Signed)
Arrival Date:02/03/18 Lot #: [id number] Exp date: 12/2018

## 2018-02-05 ENCOUNTER — Ambulatory Visit (INDEPENDENT_AMBULATORY_CARE_PROVIDER_SITE_OTHER): Payer: PPO

## 2018-02-05 DIAGNOSIS — M546 Pain in thoracic spine: Secondary | ICD-10-CM | POA: Diagnosis not present

## 2018-02-05 DIAGNOSIS — M5106 Intervertebral disc disorders with myelopathy, lumbar region: Secondary | ICD-10-CM | POA: Diagnosis not present

## 2018-02-05 DIAGNOSIS — M545 Low back pain: Secondary | ICD-10-CM | POA: Diagnosis not present

## 2018-02-05 DIAGNOSIS — J4550 Severe persistent asthma, uncomplicated: Secondary | ICD-10-CM | POA: Diagnosis not present

## 2018-02-05 DIAGNOSIS — G894 Chronic pain syndrome: Secondary | ICD-10-CM | POA: Diagnosis not present

## 2018-02-05 MED ORDER — BENRALIZUMAB 30 MG/ML ~~LOC~~ SOSY
30.0000 mg | PREFILLED_SYRINGE | Freq: Once | SUBCUTANEOUS | Status: AC
  Administered 2018-02-05: 30 mg via SUBCUTANEOUS

## 2018-02-10 ENCOUNTER — Other Ambulatory Visit: Payer: Self-pay | Admitting: Orthopaedic Surgery

## 2018-02-10 DIAGNOSIS — G894 Chronic pain syndrome: Secondary | ICD-10-CM

## 2018-02-14 ENCOUNTER — Ambulatory Visit
Admission: RE | Admit: 2018-02-14 | Discharge: 2018-02-14 | Disposition: A | Discharge status: Home / Self Care | Payer: PPO | Source: Ambulatory Visit | Attending: Orthopaedic Surgery | Admitting: Orthopaedic Surgery

## 2018-02-14 DIAGNOSIS — M48061 Spinal stenosis, lumbar region without neurogenic claudication: Secondary | ICD-10-CM | POA: Diagnosis not present

## 2018-02-14 DIAGNOSIS — G894 Chronic pain syndrome: Secondary | ICD-10-CM

## 2018-02-14 MED ORDER — GADOBENATE DIMEGLUMINE 529 MG/ML IV SOLN
15.0000 mL | Freq: Once | INTRAVENOUS | Status: AC | PRN
  Administered 2018-02-14: 15 mL via INTRAVENOUS

## 2018-02-18 DIAGNOSIS — M5136 Other intervertebral disc degeneration, lumbar region: Secondary | ICD-10-CM | POA: Diagnosis not present

## 2018-02-18 DIAGNOSIS — G894 Chronic pain syndrome: Secondary | ICD-10-CM | POA: Diagnosis not present

## 2018-03-22 DIAGNOSIS — Z299 Encounter for prophylactic measures, unspecified: Secondary | ICD-10-CM | POA: Diagnosis not present

## 2018-03-22 DIAGNOSIS — Z713 Dietary counseling and surveillance: Secondary | ICD-10-CM | POA: Diagnosis not present

## 2018-03-22 DIAGNOSIS — B009 Herpesviral infection, unspecified: Secondary | ICD-10-CM | POA: Diagnosis not present

## 2018-03-22 DIAGNOSIS — Z6828 Body mass index (BMI) 28.0-28.9, adult: Secondary | ICD-10-CM | POA: Diagnosis not present

## 2018-03-23 ENCOUNTER — Telehealth: Payer: Self-pay | Admitting: Pulmonary Disease

## 2018-03-23 DIAGNOSIS — M5136 Other intervertebral disc degeneration, lumbar region: Secondary | ICD-10-CM | POA: Diagnosis not present

## 2018-03-23 NOTE — Telephone Encounter (Signed)
Arrival Date: 03/23/18 Lot #: [id number] Exp date: 12/2018

## 2018-03-29 DIAGNOSIS — J069 Acute upper respiratory infection, unspecified: Secondary | ICD-10-CM | POA: Diagnosis not present

## 2018-03-29 DIAGNOSIS — Z6828 Body mass index (BMI) 28.0-28.9, adult: Secondary | ICD-10-CM | POA: Diagnosis not present

## 2018-03-29 DIAGNOSIS — Z87891 Personal history of nicotine dependence: Secondary | ICD-10-CM | POA: Diagnosis not present

## 2018-03-29 DIAGNOSIS — Z299 Encounter for prophylactic measures, unspecified: Secondary | ICD-10-CM | POA: Diagnosis not present

## 2018-03-29 DIAGNOSIS — J45909 Unspecified asthma, uncomplicated: Secondary | ICD-10-CM | POA: Diagnosis not present

## 2018-03-29 DIAGNOSIS — E7800 Pure hypercholesterolemia, unspecified: Secondary | ICD-10-CM | POA: Diagnosis not present

## 2018-04-02 ENCOUNTER — Ambulatory Visit: Payer: PPO

## 2018-04-08 ENCOUNTER — Ambulatory Visit: Payer: PPO

## 2018-04-12 ENCOUNTER — Ambulatory Visit (INDEPENDENT_AMBULATORY_CARE_PROVIDER_SITE_OTHER): Payer: PPO

## 2018-04-12 DIAGNOSIS — J4550 Severe persistent asthma, uncomplicated: Secondary | ICD-10-CM

## 2018-04-12 MED ORDER — BENRALIZUMAB 30 MG/ML ~~LOC~~ SOSY
30.0000 mg | PREFILLED_SYRINGE | Freq: Once | SUBCUTANEOUS | Status: AC
  Administered 2018-04-12: 30 mg via SUBCUTANEOUS

## 2018-04-12 NOTE — Progress Notes (Signed)
Documentation of medication administration and charges of Fasenra have been completed by Tremel Setters, CMA based on the hand written Fasenra documentation sheet completed by Tammy Scott, who administered the medication.  

## 2018-04-16 DIAGNOSIS — M5136 Other intervertebral disc degeneration, lumbar region: Secondary | ICD-10-CM | POA: Diagnosis not present

## 2018-04-16 DIAGNOSIS — G894 Chronic pain syndrome: Secondary | ICD-10-CM | POA: Diagnosis not present

## 2018-04-26 DIAGNOSIS — Z299 Encounter for prophylactic measures, unspecified: Secondary | ICD-10-CM | POA: Diagnosis not present

## 2018-04-26 DIAGNOSIS — Z6829 Body mass index (BMI) 29.0-29.9, adult: Secondary | ICD-10-CM | POA: Diagnosis not present

## 2018-04-26 DIAGNOSIS — J40 Bronchitis, not specified as acute or chronic: Secondary | ICD-10-CM | POA: Diagnosis not present

## 2018-05-10 ENCOUNTER — Telehealth: Payer: Self-pay | Admitting: Pulmonary Disease

## 2018-05-10 NOTE — Telephone Encounter (Signed)
Arrival Date:05/10/18 Lot #: [id number] Exp date: 01/2020  Pt ordered.

## 2018-05-18 DIAGNOSIS — J40 Bronchitis, not specified as acute or chronic: Secondary | ICD-10-CM | POA: Diagnosis not present

## 2018-05-18 DIAGNOSIS — Z299 Encounter for prophylactic measures, unspecified: Secondary | ICD-10-CM | POA: Diagnosis not present

## 2018-05-18 DIAGNOSIS — Z6829 Body mass index (BMI) 29.0-29.9, adult: Secondary | ICD-10-CM | POA: Diagnosis not present

## 2018-05-18 DIAGNOSIS — R05 Cough: Secondary | ICD-10-CM | POA: Diagnosis not present

## 2018-05-25 DIAGNOSIS — Z01419 Encounter for gynecological examination (general) (routine) without abnormal findings: Secondary | ICD-10-CM | POA: Diagnosis not present

## 2018-06-07 ENCOUNTER — Ambulatory Visit: Payer: PPO

## 2018-06-07 DIAGNOSIS — M545 Low back pain: Secondary | ICD-10-CM | POA: Diagnosis not present

## 2018-06-07 DIAGNOSIS — M5416 Radiculopathy, lumbar region: Secondary | ICD-10-CM | POA: Diagnosis not present

## 2018-06-07 DIAGNOSIS — G894 Chronic pain syndrome: Secondary | ICD-10-CM | POA: Diagnosis not present

## 2018-06-07 DIAGNOSIS — M5106 Intervertebral disc disorders with myelopathy, lumbar region: Secondary | ICD-10-CM | POA: Diagnosis not present

## 2018-06-11 ENCOUNTER — Ambulatory Visit: Payer: PPO

## 2018-06-15 ENCOUNTER — Ambulatory Visit (INDEPENDENT_AMBULATORY_CARE_PROVIDER_SITE_OTHER): Payer: PPO

## 2018-06-15 DIAGNOSIS — J4550 Severe persistent asthma, uncomplicated: Secondary | ICD-10-CM

## 2018-06-15 MED ORDER — BENRALIZUMAB 30 MG/ML ~~LOC~~ SOSY
30.0000 mg | PREFILLED_SYRINGE | Freq: Once | SUBCUTANEOUS | Status: AC
  Administered 2018-06-15: 30 mg via SUBCUTANEOUS

## 2018-06-15 NOTE — Progress Notes (Signed)
Documentation of medication administration and charges of Berna Bue have been completed by Desmond Dike, CMA based on the hand written Fasenra documentation sheet completed by Alroy Bailiff, who administered the medication.

## 2018-06-17 DIAGNOSIS — M5136 Other intervertebral disc degeneration, lumbar region: Secondary | ICD-10-CM | POA: Diagnosis not present

## 2018-06-30 ENCOUNTER — Ambulatory Visit: Payer: PPO | Admitting: Orthopedic Surgery

## 2018-07-01 ENCOUNTER — Ambulatory Visit: Payer: PPO

## 2018-07-01 ENCOUNTER — Encounter: Payer: Self-pay | Admitting: Orthopedic Surgery

## 2018-07-01 ENCOUNTER — Ambulatory Visit: Payer: PPO | Admitting: Orthopedic Surgery

## 2018-07-01 ENCOUNTER — Ambulatory Visit (INDEPENDENT_AMBULATORY_CARE_PROVIDER_SITE_OTHER): Payer: PPO

## 2018-07-01 VITALS — BP 126/76 | HR 80 | Ht 65.0 in | Wt 170.0 lb

## 2018-07-01 DIAGNOSIS — G8929 Other chronic pain: Secondary | ICD-10-CM

## 2018-07-01 DIAGNOSIS — M48061 Spinal stenosis, lumbar region without neurogenic claudication: Secondary | ICD-10-CM | POA: Diagnosis not present

## 2018-07-01 DIAGNOSIS — M25551 Pain in right hip: Secondary | ICD-10-CM

## 2018-07-01 DIAGNOSIS — M25552 Pain in left hip: Principal | ICD-10-CM

## 2018-07-01 NOTE — Progress Notes (Signed)
NEW PATIENT OFFICE VISIT  Chief Complaint  Patient presents with  . Hip Pain    bilateral lateral hips and groin pain/ had lumbar injection couple weeks ago no relief     53 year old female status post posterior fusion presents with bilateral gluteal pain radiating to both groin and lateral leg bilaterally.  Location: bilateral buttocks and radiates to both groins and lateral upper legs  Duration: 3 to 4 months Timing: Pain during the day but it is worse with walking and lying down flat at night seems to be somewhat activity related Intensity data 10 Modifying lumbar injection partial relief    Review of Systems  Constitutional: Negative for chills and fever.  Respiratory: Negative for shortness of breath.        History of asthma  Musculoskeletal: Negative for neck pain.  Skin: Negative for rash.  Neurological: Negative for tingling, sensory change and focal weakness.     Past Medical History:  Diagnosis Date  . Allergic rhinitis   . Asthma   . Benign familial tremor   . Bipolar 1 disorder ([place])   . Chronic neck and back pain   . Degenerative disk disease   . Dyslipidemia   . Fibromyalgia   . GERD (gastroesophageal reflux disease)   . Hiatal hernia   . Irritable bowel syndrome (IBS)   . Memory disorder 07/18/2014  . Memory loss   . Migraine headache   . Nephrolithiasis   . Pneumonia 1986  . Serotonin syndrome     Past Surgical History:  Procedure Laterality Date  . ANKLE FRACTURE SURGERY  2006   Right  . APPENDECTOMY    . back fusion    . BACK SURGERY    . BRONCHOSCOPY     S. aureus from BAL  . CARDIAC CATHETERIZATION  November 27, 2006   minimal CAD  . ESOPHAGEAL DILATION  August 06, 2004  . LAPAROSCOPIC CHOLECYSTECTOMY    . LASER ABLATION OF THE CERVIX    . MICRODISSECTION L5-S1  August 13, 2000  . TONSILLECTOMY      Family History  Problem Relation Age of Onset  . Allergies Mother   . Clotting disorder Mother   . Diabetes Mother   . Allergies  Father   . Heart disease Father   . Heart attack Father   . Diabetes Father   . Allergies Brother   . [place] Maternal Grandmother    Social History   Tobacco Use  . Smoking status: Former Smoker    Packs/day: 0.50    Years: 8.00    Pack years: 4.00    Types: Cigarettes    Last attempt to quit: 06/09/1978    Years since quitting: 40.0  . Smokeless tobacco: Never Used  Substance Use Topics  . Alcohol use: No  . Drug use: No    Allergies  Allergen Reactions  . Cephalexin   . Cephalosporins   . Cymbalta [Duloxetine Hcl]   . Divalproex Sodium   . Effexor [Venlafaxine Hydrochloride]   . Geodon [Ziprasidone Hydrochloride]   . Hydrocodone     Pt states she is able to take in liquid/hydromet form  . Prozac [Fluoxetine Hcl]   . Theo-Dur [Theophylline]   . Thorazine [Chlorpromazine Hcl]   . Topamax     Current Meds  Medication Sig  . atorvastatin (LIPITOR) 10 MG tablet Take 10 mg by mouth daily.  . budesonide-formoterol (SYMBICORT) 160-4.5 MCG/ACT inhaler INHALE 2 PUFFS INTO THE LUNGS 2 (TWO) TIMES DAILY.  [name]  carbamazepine (TEGRETOL-XR) 100 MG 12 hr tablet Take 300 mg by mouth at bedtime.   . cyclobenzaprine (FLEXERIL) 10 MG tablet Take 10 mg by mouth 2 (two) times daily as needed for muscle spasms.   [name] HYDROcodone-homatropine (HYDROMET) 5-1.5 MG/5ML syrup Take 5 mLs by mouth every 4 (four) hours as needed for cough.  . montelukast (SINGULAIR) 10 MG tablet Take 10 mg by mouth at bedtime.   . pantoprazole (PROTONIX) 40 MG tablet Take 40 mg by mouth daily.    BP 126/76   Pulse 80   Ht 5\' 5"  (1.651 m)   Wt 170 lb (77.1 kg)   BMI 28.29 kg/m   Physical Exam Vitals signs reviewed.  Constitutional:      Appearance: She is well-developed.  Neurological:     Mental Status: She is alert and oriented to person, place, and time.     Gait: Gait abnormal.     Comments: [place]   Psychiatric:        Attention and Perception: Attention normal.        Mood and Affect: Mood and  affect normal.        Speech: Speech normal.        Behavior: Behavior normal.        Thought Content: Thought content normal.        Judgment: Judgment normal.     Right Hip Exam  Right hip exam is normal.   Tenderness  The patient is experiencing no tenderness.   Range of Motion  The patient has normal right hip ROM.  Muscle Strength  The patient has normal right hip strength.  Tests  FABER: negative  Other  Erythema: absent Sensation: normal Pulse: present  Comments:  HIP STABILITY NORMAL    Left Hip Exam  Left hip exam is normal.  Tenderness  The patient is experiencing no tenderness.   Range of Motion  The patient has normal left hip ROM.  Muscle Strength  The patient has normal left hip strength.   Tests  FABER: negative  Other  Erythema: absent Sensation: normal Pulse: present  Comments:  HIP STABILITY NORMAL         MEDICAL DECISION SECTION  Xrays were done at [facility] orth[facility]ics  My independent reading of xrays:  Under separate cover see report pelvis AP lateral both hips no evidence of arthritis or bone issue  Encounter Diagnoses  Name Primary?  . Chronic hip pain, left   . Chronic pain of right hip   . Degenerative lumbar spinal stenosis Yes    PLAN: (Rx., injectx, surgery, frx, mri/ct) Referred back to the neurosurgeon for possible spinal fusion  No orders of the defined types were placed in this encounter.   Arther Abbott, MD  07/01/2018 2:41 PM

## 2018-07-02 ENCOUNTER — Ambulatory Visit: Payer: Medicare Other | Admitting: Orthopedic Surgery

## 2018-07-12 ENCOUNTER — Telehealth: Payer: Self-pay | Admitting: Pulmonary Disease

## 2018-07-12 DIAGNOSIS — J4550 Severe persistent asthma, uncomplicated: Secondary | ICD-10-CM

## 2018-07-12 NOTE — Telephone Encounter (Signed)
WIll route to [facility] Scott/[name]

## 2018-07-13 DIAGNOSIS — Z299 Encounter for prophylactic measures, unspecified: Secondary | ICD-10-CM | POA: Diagnosis not present

## 2018-07-13 DIAGNOSIS — Z6830 Body mass index (BMI) 30.0-30.9, adult: Secondary | ICD-10-CM | POA: Diagnosis not present

## 2018-07-13 DIAGNOSIS — Z87891 Personal history of nicotine dependence: Secondary | ICD-10-CM | POA: Diagnosis not present

## 2018-07-13 DIAGNOSIS — M25522 Pain in left elbow: Secondary | ICD-10-CM | POA: Diagnosis not present

## 2018-07-13 NOTE — Telephone Encounter (Signed)
Waiting on documentation from TS.  

## 2018-07-15 NOTE — Telephone Encounter (Signed)
1 prefilled syringe Ordered date: 07/15/18  Shipping date: 07/19/2018

## 2018-07-15 NOTE — Telephone Encounter (Signed)
Nancy [name], is there an update on this?

## 2018-07-16 NOTE — Telephone Encounter (Signed)
Called patient, unable to reach and unable to leave voicemail as busy signal was given.

## 2018-07-19 NOTE — Telephone Encounter (Signed)
Routed to Tammy S to follow up

## 2018-07-21 NOTE — Telephone Encounter (Signed)
No, it did not come in,I'll call and find out why when I get a chance.

## 2018-07-21 NOTE — Telephone Encounter (Signed)
Tammy, please advise if this encounter is good to be closed. Thank you.

## 2018-07-22 NOTE — Telephone Encounter (Signed)
Routing back to [employer] to follow up on.

## 2018-07-26 NOTE — Telephone Encounter (Signed)
Nancy [name], any updates? 

## 2018-07-27 DIAGNOSIS — M545 Low back pain: Secondary | ICD-10-CM | POA: Diagnosis not present

## 2018-07-27 DIAGNOSIS — G894 Chronic pain syndrome: Secondary | ICD-10-CM | POA: Diagnosis not present

## 2018-07-27 DIAGNOSIS — M5106 Intervertebral disc disorders with myelopathy, lumbar region: Secondary | ICD-10-CM | POA: Diagnosis not present

## 2018-07-27 NOTE — Telephone Encounter (Signed)
Rep from [facility] [place] called in for a new Fasenra rx. Phone: [phone], fax for rx: [phone].  Will route to triage.

## 2018-07-28 NOTE — Telephone Encounter (Signed)
No documentation on how fascenra is to be taken  I am routing this to TS and KW for f/u

## 2018-08-03 NOTE — Telephone Encounter (Signed)
Tammy S, for update

## 2018-08-04 DIAGNOSIS — M5136 Other intervertebral disc degeneration, lumbar region: Secondary | ICD-10-CM | POA: Diagnosis not present

## 2018-08-05 NOTE — Telephone Encounter (Signed)
I faxed [facility] Product [employer]placement form 07/28/2018. I haven't received anything from them via fax or ph. Call. Calling [facility] now. Rep is reviewing pt's account.

## 2018-08-05 NOTE — Telephone Encounter (Signed)
The first fax didn't go through. So I faxed it again today.  Pt's 07/20/2018 shipment wasn't delivered so [facility] is replacing Fasenra. Waiting on fax confirmation and med.. Called pt to make her aware. Nancy [name] should be here by pt. Appt. On 08/16/2018. If not I 'll call the pt. Leaving encounter open to record del.[name]

## 2018-08-10 NOTE — Telephone Encounter (Signed)
I called [facility] back, I asked the rep if they received the replacement form I faxed to them. (I have the confirmation sheet.) I let the rep know that her appt. Is 08/16/2018 so hopefully [employer] will del. Fasenra by then.(Rep. sent an e-mail to expedite the process. Waiting.

## 2018-08-11 ENCOUNTER — Telehealth: Payer: Self-pay | Admitting: Pulmonary Disease

## 2018-08-11 MED ORDER — BUDESONIDE-FORMOTEROL FUMARATE 160-4.5 MCG/ACT IN AERO: INHALATION_SPRAY | RESPIRATORY_TRACT | 0 refills | Status: DC

## 2018-08-11 NOTE — Telephone Encounter (Signed)
[place], [place] needs a written rx for pt's Fasenra. Pt is out of refills. [facility] will not take a verbal rx, I asked.  Routing to VS and [facility], rx can be written.

## 2018-08-11 NOTE — Telephone Encounter (Signed)
Can one of you print script and then I can sign when I am in office next week.

## 2018-08-11 NOTE — Telephone Encounter (Signed)
Called and spoke with patient. Patient requested a refill for Symbicort 160, [facility] [place]  Patient's last Ov with Dr Halford Chessman was 10/27/72, for eosinophilic asthma.  Asked if we could schedule her a OV with Dr Halford Chessman since it has been at a year. Patient refused OV at this time. Patient stated that she had been seen since 08/31/17, by Dr Halford Chessman.  Patient has been here for Fasenra injections only.  Symbicort refill sent to pharmacy with no refills. Recommended to Patient upcoming OV for future refills.

## 2018-08-16 ENCOUNTER — Ambulatory Visit: Payer: PPO

## 2018-08-16 ENCOUNTER — Telehealth: Payer: Self-pay | Admitting: Pulmonary Disease

## 2018-08-16 MED ORDER — BENRALIZUMAB 30 MG/ML ~~LOC~~ SOSY: 30.0000 mL | PREFILLED_SYRINGE | SUBCUTANEOUS | 0 refills | Status: DC

## 2018-08-16 MED ORDER — BENRALIZUMAB 30 MG/ML ~~LOC~~ SOAJ: 30.0000 mL | SUBCUTANEOUS | 0 refills | Status: DC

## 2018-08-16 MED ORDER — BENRALIZUMAB 30 MG/ML ~~LOC~~ SOAJ: 30.0000 mL | Freq: Every day | SUBCUTANEOUS | 0 refills | Status: DC

## 2018-08-16 NOTE — Telephone Encounter (Addendum)
Called and spoke with Tamika with [employer] at phone [phone] Pt has no refills left on her [place] rx, they do not except verbal refill rx over phone per last note Tamika states that the rx has 6 refills, everything is in the system correct; nothing needed. We update old office contact info to new contact info today  Printed fasenra rx today, VS signed it Faxed to Phone: [phone], fax for rx: [phone] to [employer] Faxed confirmed went through  Routing to TS to let her know update.

## 2018-08-17 NOTE — Telephone Encounter (Signed)
Will await response from TS

## 2018-08-18 NOTE — Telephone Encounter (Signed)
1 prefilled syringe Ordered date: 08/16/2018 Shipping date: 08/17/18 Someone ordered med Mon. While I was off.

## 2018-08-18 NOTE — Telephone Encounter (Signed)
Arrival Date:08/18/2018 Lot #: [id number] Exp date: 02/2020

## 2018-08-18 NOTE — Telephone Encounter (Signed)
Please refer to ph. Note 08/16/2018 for up dates.

## 2018-08-20 ENCOUNTER — Other Ambulatory Visit: Payer: Self-pay

## 2018-08-20 ENCOUNTER — Telehealth: Payer: Self-pay | Admitting: Pulmonary Disease

## 2018-08-20 ENCOUNTER — Ambulatory Visit (INDEPENDENT_AMBULATORY_CARE_PROVIDER_SITE_OTHER): Payer: PPO

## 2018-08-20 DIAGNOSIS — J4550 Severe persistent asthma, uncomplicated: Secondary | ICD-10-CM

## 2018-08-20 NOTE — Telephone Encounter (Signed)
Nothing further is needed at this time. See phone note 08/16/18.

## 2018-08-23 MED ORDER — BENRALIZUMAB 30 MG/ML ~~LOC~~ SOSY
30.0000 mg | PREFILLED_SYRINGE | Freq: Once | SUBCUTANEOUS | Status: AC
  Administered 2018-08-20: 30 mg via SUBCUTANEOUS

## 2018-08-23 NOTE — Telephone Encounter (Signed)
Will route to injection pool per protocol

## 2018-08-25 NOTE — Telephone Encounter (Signed)
This message will need to be handled by Alroy Bailiff as she works in the injection room and knows who has medication in [place].

## 2018-08-26 NOTE — Telephone Encounter (Signed)
Yes, pt has received her inj. As well. Her next inj. Will be in the middle of May/2020. Nothing further needed.

## 2018-09-02 ENCOUNTER — Other Ambulatory Visit: Payer: Self-pay | Admitting: Pulmonary Disease

## 2018-09-14 ENCOUNTER — Ambulatory Visit: Payer: PPO | Admitting: Pulmonary Disease

## 2018-09-24 DIAGNOSIS — Z0000 Encounter for general adult medical examination without abnormal findings: Secondary | ICD-10-CM | POA: Diagnosis not present

## 2018-09-24 DIAGNOSIS — F329 Major depressive disorder, single episode, unspecified: Secondary | ICD-10-CM | POA: Diagnosis not present

## 2018-09-24 DIAGNOSIS — Z1331 Encounter for screening for depression: Secondary | ICD-10-CM | POA: Diagnosis not present

## 2018-09-24 DIAGNOSIS — Z1211 Encounter for screening for malignant neoplasm of colon: Secondary | ICD-10-CM | POA: Diagnosis not present

## 2018-09-24 DIAGNOSIS — Z1339 Encounter for screening examination for other mental health and behavioral disorders: Secondary | ICD-10-CM | POA: Diagnosis not present

## 2018-09-24 DIAGNOSIS — Z6830 Body mass index (BMI) 30.0-30.9, adult: Secondary | ICD-10-CM | POA: Diagnosis not present

## 2018-09-24 DIAGNOSIS — R5383 Other fatigue: Secondary | ICD-10-CM | POA: Diagnosis not present

## 2018-09-24 DIAGNOSIS — Z79899 Other long term (current) drug therapy: Secondary | ICD-10-CM | POA: Diagnosis not present

## 2018-09-24 DIAGNOSIS — Z7189 Other specified counseling: Secondary | ICD-10-CM | POA: Diagnosis not present

## 2018-09-24 DIAGNOSIS — E7800 Pure hypercholesterolemia, unspecified: Secondary | ICD-10-CM | POA: Diagnosis not present

## 2018-09-24 DIAGNOSIS — Z299 Encounter for prophylactic measures, unspecified: Secondary | ICD-10-CM | POA: Diagnosis not present

## 2018-10-03 ENCOUNTER — Encounter: Payer: Self-pay | Admitting: Orthopedic Surgery

## 2018-10-07 DIAGNOSIS — M5416 Radiculopathy, lumbar region: Secondary | ICD-10-CM | POA: Diagnosis not present

## 2018-10-11 ENCOUNTER — Telehealth: Payer: Self-pay | Admitting: Pulmonary Disease

## 2018-10-11 MED ORDER — BENRALIZUMAB 30 MG/ML ~~LOC~~ SOAJ: 1.0000 mL | SUBCUTANEOUS | 11 refills | Status: DC

## 2018-10-11 NOTE — Telephone Encounter (Signed)
Berna Bue Order: 30mg  #2 prefilled syringe Ordered date: 10/11/2018 Expected date of arrival: N/A Ordered by: Desmond Dike, [place]: [employer]

## 2018-10-15 DIAGNOSIS — M47816 Spondylosis without myelopathy or radiculopathy, lumbar region: Secondary | ICD-10-CM | POA: Diagnosis not present

## 2018-10-15 DIAGNOSIS — F329 Major depressive disorder, single episode, unspecified: Secondary | ICD-10-CM | POA: Diagnosis not present

## 2018-10-15 DIAGNOSIS — Z299 Encounter for prophylactic measures, unspecified: Secondary | ICD-10-CM | POA: Diagnosis not present

## 2018-10-15 DIAGNOSIS — Z713 Dietary counseling and surveillance: Secondary | ICD-10-CM | POA: Diagnosis not present

## 2018-10-15 DIAGNOSIS — J45909 Unspecified asthma, uncomplicated: Secondary | ICD-10-CM | POA: Diagnosis not present

## 2018-10-18 NOTE — Telephone Encounter (Signed)
Called [employer], because we don't have pt's Fasenra. The rep I spoke to today said the rep Ria Comment spoke to on 10/11/2018 ordered on the expired rx.  Rep today, placed the order on the new rx.Herbert Spires would be here in 2 business days. I explained to her pt's appt is 10/19/2018 could they have it here by tomorrow. After all med was ordered in plenty of time. Rep said she would expedite the order and someone would get back with [place] to let us know if [place] is going to be able to send Berna Bue on time. So we can call the pt and let her know what happened and why we're not going to be able to get her Fasenra by 10/19/2018. Routing to inj poole to make [place] aware.

## 2018-10-19 ENCOUNTER — Ambulatory Visit: Payer: PPO

## 2018-10-19 NOTE — Telephone Encounter (Signed)
Still in limbo, waiting on the pharmacy at [place]. Seth Bake the rep I spoke to this morning said they have been having serious issues with perishable med deliveries. They were trying to get them resolved 10/18/2018. This is the third time I've called about pt's Fasenra. I waited on pharmacy all day yesterday still waiting this morning. Onalee Hua ordered another pt's Berna Bue who gets her med from [place] back to back. We received her med.. Called pt to let her know, we're still waiting on the pharm. Seth Bake said if she or some one form [employer] doesn't call back to call them back this afternoon. Waiting.

## 2018-10-19 NOTE — Telephone Encounter (Signed)
Called [facility] again. Pharm. Still has not reached out to [place] or maybe it's the 1st rep I spoke with 10/18/2018.( At this point I'm not sure.(4th call)) The rep I just spoke with around 3:00 said they would get in touch with [place] when they are able to talk to a pharmacist and find out which order is valid and when order can be shipped. Called pt to let her know we're going to have to cancel her appt for a second time. Also told her I would call her when Berna Bue comes in.

## 2018-10-20 ENCOUNTER — Ambulatory Visit (INDEPENDENT_AMBULATORY_CARE_PROVIDER_SITE_OTHER): Payer: PPO

## 2018-10-20 ENCOUNTER — Other Ambulatory Visit: Payer: Self-pay

## 2018-10-20 ENCOUNTER — Ambulatory Visit: Payer: PPO

## 2018-10-20 DIAGNOSIS — J4550 Severe persistent asthma, uncomplicated: Secondary | ICD-10-CM | POA: Diagnosis not present

## 2018-10-20 MED ORDER — BENRALIZUMAB 30 MG/ML ~~LOC~~ SOSY
30.0000 mg | PREFILLED_SYRINGE | Freq: Once | SUBCUTANEOUS | Status: AC
  Administered 2018-10-20: 12:00:00 30 mg via SUBCUTANEOUS

## 2018-10-20 NOTE — Progress Notes (Signed)
Have you been hospitalized within the last 10 days?  No Do you have a fever?  No Do you have a cough?  No Do you have a headache or sore throat? No  

## 2018-10-20 NOTE — Telephone Encounter (Signed)
[facility] has 2 rx' for this pt.. One was done in Mar. And the other in May of this yr.. Both rxs were written for Fasenra Pen. Just wanted to make sure it's ok pt gets injections at home. Berna Bue Pens are home injs.[name])  This will work out better for the pt because she lives in [facility] [place] [facility] and pt is going to be getting a back infusion and will not be very mobile. Will route to [facility] and VS.

## 2018-10-21 NOTE — Telephone Encounter (Signed)
Okay to do injections at home.

## 2018-10-26 NOTE — Telephone Encounter (Signed)
Spoke with pt. She is aware of Dr. Juanetta Gosling response. Nothing further was needed.

## 2018-10-28 ENCOUNTER — Telehealth: Payer: Self-pay | Admitting: Pulmonary Disease

## 2018-10-28 DIAGNOSIS — J069 Acute upper respiratory infection, unspecified: Secondary | ICD-10-CM | POA: Diagnosis not present

## 2018-10-28 DIAGNOSIS — Z299 Encounter for prophylactic measures, unspecified: Secondary | ICD-10-CM | POA: Diagnosis not present

## 2018-10-28 DIAGNOSIS — Z87891 Personal history of nicotine dependence: Secondary | ICD-10-CM | POA: Diagnosis not present

## 2018-10-28 NOTE — Telephone Encounter (Signed)
Spoke with patient. She has been scheduled for a surgical clearance for 6/1 at 4pm.

## 2018-11-02 ENCOUNTER — Other Ambulatory Visit: Payer: Self-pay | Admitting: Pulmonary Disease

## 2018-11-03 ENCOUNTER — Telehealth: Payer: Self-pay | Admitting: Pulmonary Disease

## 2018-11-03 NOTE — Telephone Encounter (Signed)
LM...Symbicort 160 was sent into pharmacy on yesterday.

## 2018-11-05 DIAGNOSIS — G894 Chronic pain syndrome: Secondary | ICD-10-CM | POA: Diagnosis not present

## 2018-11-05 DIAGNOSIS — M4716 Other spondylosis with myelopathy, lumbar region: Secondary | ICD-10-CM | POA: Diagnosis not present

## 2018-11-05 DIAGNOSIS — M5416 Radiculopathy, lumbar region: Secondary | ICD-10-CM | POA: Diagnosis not present

## 2018-11-08 ENCOUNTER — Encounter: Payer: Self-pay | Admitting: Adult Health

## 2018-11-08 ENCOUNTER — Ambulatory Visit (INDEPENDENT_AMBULATORY_CARE_PROVIDER_SITE_OTHER): Payer: PPO

## 2018-11-08 ENCOUNTER — Ambulatory Visit (INDEPENDENT_AMBULATORY_CARE_PROVIDER_SITE_OTHER): Payer: PPO | Admitting: Adult Health

## 2018-11-08 ENCOUNTER — Other Ambulatory Visit: Payer: Self-pay

## 2018-11-08 VITALS — BP 118/80 | HR 109 | Temp 97.8°F | Ht 65.0 in | Wt 178.4 lb

## 2018-11-08 DIAGNOSIS — J4550 Severe persistent asthma, uncomplicated: Secondary | ICD-10-CM

## 2018-11-08 DIAGNOSIS — Z01811 Encounter for preprocedural respiratory examination: Secondary | ICD-10-CM

## 2018-11-08 DIAGNOSIS — Z01818 Encounter for other preprocedural examination: Secondary | ICD-10-CM

## 2018-11-08 NOTE — Patient Instructions (Addendum)
Continue on current regimen  Best wishes with your upcoming back surgery .  Chest xray today .  Follow up with Dr. Halford Chessman  In 4-6 months and As needed   Please contact office for sooner follow up if symptoms do not improve or worsen or seek emergency care

## 2018-11-08 NOTE — Progress Notes (Signed)
@Patient  ID: Nancy [name], female    DOB: [birth year], 53 y.o.   MRN: [id number]  No chief complaint on file.    Referring provider: Glenda Chroman, MD  HPI: 53 year old female former smoker followed for allergic asthma with eosinophilic phenotype  TEST/EVENTS :  Spirometry 03/05/10>>FEV1 2.43(87%), FEV1% 93 IgE 2008>>412 IgE 04/01/11>>162.1 Xolair from 11/23/07 to 03/05/10>>stopped due to insurance issues RAST 04/01/11>>Cat dander, dog dander, dust mites PFT 05/28/11>>FEV1 3.01(112%), FEV1% 78, TLC 5.64(112%), RV 2.28(135%), DLCO 99%, positive bronchodilator response. December 2012 >> resumed xolair >> stopped Summer 2014 due to finances >> resumed September 2014 January 2016 >> xolair d/c'ed due to finances FeNO 08/19/16 >> 10 Spirometry 08/19/16 >> FEV1 2.49 (84%), FEV1% 79 Labs 08/19/16 >> IgE 120  11/08/2018  Follow up : Asthma , Preo OP Clearance  Patient returns for a 1 year follow-up.  Patient has underlying allergic asthma with eosinophilic phenotype.  She remains on Symbicort twice daily, Singulair daily, Zyrtec daily, and Fasenra .  Says overall she is doing okay she is had no flare of her cough or wheezing.  She uses her albuterol on average 3 times a week.  She is able to do light housework but gets winded with heavy activity.  She is disabled.  She denies any fever chest pain orthopnea PND or increased leg swelling.  Patient does have upcoming back surgery.  This is been done with Dr.Cohen at [facility].  No documentation from requesting MD . She says she is having a spinal fusion . Has had a previous spinal fusion in past. Previous spirometry in 2018 showed no airflow obstruction with FEV1 at 84% ratio 79.  Has never been on ventilator due to asthma flare . not on any oxygen. Patient has not had any steroids in greater than 3 months.  She has been set up for pre-procedure screening for COVID-19.  She denies any fever or sick contacts.    Allergies   Allergen Reactions  . Cephalexin   . Cephalosporins   . Cymbalta [Duloxetine Hcl]   . Divalproex Sodium   . Effexor [Venlafaxine Hydrochloride]   . Geodon [Ziprasidone Hydrochloride]   . Hydrocodone     Pt states she is able to take in liquid/hydromet form  . Prozac [Fluoxetine Hcl]   . Theo-Dur [Theophylline]   . Thorazine [Chlorpromazine Hcl]   . Topamax     Immunization History  Administered Date(s) Administered  . Influenza Split 02/19/2011  . Influenza Whole 03/09/2012  . Influenza,inj,Quad PF,6+ Mos 03/08/2013, 03/01/2014, 02/08/2015, 02/04/2016, 03/03/2017    Past Medical History:  Diagnosis Date  . Allergic rhinitis   . Asthma   . Benign familial tremor   . Bipolar 1 disorder ([place])   . Chronic neck and back pain   . Degenerative disk disease   . Dyslipidemia   . Fibromyalgia   . GERD (gastroesophageal reflux disease)   . Hiatal hernia   . Irritable bowel syndrome (IBS)   . Memory disorder 07/18/2014  . Memory loss   . Migraine headache   . Nephrolithiasis   . Pneumonia 1986  . Serotonin syndrome     Tobacco History: Social History   Tobacco Use  Smoking Status Former Smoker  . Packs/day: 0.50  . Years: 8.00  . Pack years: 4.00  . Types: Cigarettes  . Last attempt to quit: 06/09/1978  . Years since quitting: 40.4  Smokeless Tobacco Never Used   Counseling given: Not Answered   Outpatient Medications  Prior to Visit  Medication Sig Dispense Refill  . albuterol (PROVENTIL HFA;VENTOLIN HFA) 108 (90 BASE) MCG/ACT inhaler Inhale 2 puffs into the lungs every 6 (six) hours as needed. 1 Inhaler 5  . albuterol (PROVENTIL) (2.5 MG/3ML) 0.083% nebulizer solution Take 2.5 mg by nebulization every 6 (six) hours as needed.      [name] atorvastatin (LIPITOR) 10 MG tablet Take 10 mg by mouth daily.    . Benralizumab (FASENRA PEN) 30 MG/ML SOAJ Inject 1 mL into the skin every 8 (eight) weeks. 1 mL 11  . budesonide-formoterol (SYMBICORT) 160-4.5 MCG/ACT inhaler TAKE 2  PUFFS BY MOUTH TWICE A DAY 30.6 Inhaler 0  . buPROPion (WELLBUTRIN) 100 MG tablet Take 100 mg by mouth daily.    . carbamazepine (TEGRETOL-XR) 100 MG 12 hr tablet Take 300 mg by mouth at bedtime.     . cetirizine (ZYRTEC) 10 MG tablet Take 10 mg by mouth daily.    . cyclobenzaprine (FLEXERIL) 10 MG tablet Take 10 mg by mouth 2 (two) times daily as needed for muscle spasms.     [name] docusate sodium (COLACE) 100 MG capsule Take 200 mg by mouth daily.    [name] gabapentin (NEURONTIN) 300 MG capsule Take 300 mg by mouth 3 (three) times daily.    [name] HYDROcodone-acetaminophen (NORCO) 10-325 MG per tablet Take 1 tablet by mouth 3 (three) times daily.    [name] HYDROcodone-homatropine (HYDROMET) 5-1.5 MG/5ML syrup Take 5 mLs by mouth every 4 (four) hours as needed for cough. 240 mL 0  . lamoTRIgine (LAMICTAL) 200 MG tablet Take 100 mg by mouth daily.     . montelukast (SINGULAIR) 10 MG tablet Take 10 mg by mouth at bedtime.     . pantoprazole (PROTONIX) 40 MG tablet Take 40 mg by mouth daily.     No facility-administered medications prior to visit.      Review of Systems:   Constitutional:   No  weight loss, night sweats,  Fevers, chills, fatigue, or  lassitude.  HEENT:   No headaches,  Difficulty swallowing,  Tooth/dental problems, or  Sore throat,                No sneezing, itching, ear ache, nasal congestion, post nasal drip,   CV:  No chest pain,  Orthopnea, PND, swelling in lower extremities, anasarca, dizziness, palpitations, syncope.   GI  No heartburn, indigestion, abdominal pain, nausea, vomiting, diarrhea, change in bowel habits, loss of appetite, bloody stools.   Resp: .  No excess mucus, no productive cough,  No non-productive cough,  No coughing up of blood.  No change in color of mucus.  No wheezing.  No chest wall deformity  Skin: no rash or lesions.  GU: no dysuria, change in color of urine, no urgency or frequency.  No flank pain, no hematuria   MS:  No joint pain or swelling.  No  decreased range of motion.  No back pain.    Physical Exam  BP 118/80 (BP Location: Left Arm, Cuff Size: Normal)   Pulse (!) 109   Temp 97.8 F (36.6 C) (Oral)   Ht 5\' 5"  (1.651 m)   Wt 178 lb 6.4 oz (80.9 kg)   SpO2 95%   BMI 29.69 kg/m   GEN: A/Ox3; pleasant , NAD, well nourished    HEENT:  [place]/AT,  EACs-clear, TMs-wnl, NOSE-clear, THROAT-clear, no lesions, no postnasal drip or exudate noted.   NECK:  Supple w/ fair ROM; no JVD; normal carotid impulses w/o  bruits; no thyromegaly or nodules palpated; no lymphadenopathy.    RESP  Clear  P & A; w/o, wheezes/ rales/ or rhonchi. no accessory muscle use, no dullness to percussion  CARD:  RRR, no m/r/g, no peripheral edema, pulses intact, no cyanosis or clubbing.  GI:   Soft & nt; nml bowel sounds; no organomegaly or masses detected.   Musco: Warm bil, no deformities or joint swelling noted.   Neuro: alert, no focal deficits noted.    Skin: Warm, no lesions or rashes    Lab Results:  CBC   BNP No results found for: BNP  ProBNP No results found for: PROBNP  Imaging: No results found.  Benralizumab SOSY 30 mg    Date Action Dose Route User   10/20/2018 1153 Given 30 mg Subcutaneous (Right Arm) Nicki Reaper, Yanette Tripoli B      PFT Results Latest Ref Rng & Units 01/17/2014  FVC-Pre L 2.15  FVC-Predicted Pre % 61  FVC-Post L 2.94  FVC-Predicted Post % 84  Pre FEV1/FVC % % 61  Post FEV1/FCV % % 78  FEV1-Pre L 1.30  FEV1-Predicted Pre % 46  FEV1-Post L 2.28  DLCO UNC% % 102  DLCO COR %Predicted % 116  TLC L 4.61  TLC % Predicted % 94  RV % Predicted % 135    Lab Results  Component Value Date   NITRICOXIDE 10 08/19/2016        Assessment & Plan:   No problem-specific Assessment & Plan notes found for this encounter.     Rexene Edison, NP 11/08/2018

## 2018-11-09 DIAGNOSIS — Z01818 Encounter for other preprocedural examination: Secondary | ICD-10-CM | POA: Insufficient documentation

## 2018-11-09 NOTE — Assessment & Plan Note (Signed)
Compensated on present regimen.  Plan  Patient Instructions  Continue on current regimen  Best wishes with your upcoming back surgery .  Chest xray today .  Follow up with Dr. Halford Chessman  In 4-6 months and As needed   Please contact office for sooner follow up if symptoms do not improve or worsen or seek emergency care

## 2018-11-09 NOTE — Progress Notes (Signed)
LMOMTCB x 1 

## 2018-11-09 NOTE — Assessment & Plan Note (Signed)
Pulmonary preop clearance-patient has moderate persistent asthma/allergic asthma with a allergic phenotype Currently doing well on her present regimen. She is active at home with light housework and able to drive . On no oxygen. No recent steroids.  She is cleared from pulmonary standpoint to proceed with surgery  Explained that she has a moderate pulmonary risk and potential surgery risks from pulmonary standpoint   Major Pulmonary risks identified in the multifactorial risk analysis are but not limited to a) pneumonia; b) recurrent intubation risk; c) prolonged or recurrent acute respiratory failure needing mechanical ventilation; d) prolonged hospitalization; e) DVT/Pulmonary embolism; f) Acute Pulmonary edema  Recommend 1. Short duration of surgery as much as possible and avoid paralytic if possible 2. Recovery in step down or ICU with Pulmonary consultation if indicated.  3. DVT prophylaxis 4. Aggressive pulmonary toilet with o2, bronchodilatation, and incentive spirometry and early ambulation

## 2018-11-11 ENCOUNTER — Telehealth: Payer: Self-pay | Admitting: Adult Health

## 2018-11-11 NOTE — Telephone Encounter (Signed)
Notes recorded by Melvenia Needles, NP on 11/08/2018 at 10:44 PM EDT Lungs are clear , cont w/ ov recs  Left a detailed message advising pt that her CXR was clear.

## 2018-11-11 NOTE — Telephone Encounter (Signed)
Patient returning phone call for results.  Patient phone number is [phone].  May leave detailed message on voicemail.

## 2018-11-11 NOTE — Telephone Encounter (Signed)
Checked with Cherina who is currently working with TP to see if a surgical clearance form was received from [facility] and per Cherina, no form has not been received.  Called and spoke with pt letting her know that we have not received a form from them that needs to be filled out and asked if she had phone number for me so I could call them to get the form sent to [place] and per pt, call Deanna at [phone].  Called and spoke with Deanna stating to her that we have not received form for pt's surgical clearance and after stating that to [facility], she checked to see what fax number the form was sent to. The form had been sent to our old office fax. I have provided Deanna with our new address, phone, and fax number and she stated she would refax the form to [place] in  TP's attn since she is the one who pt had surgical clearance visit with. Will keep encounter open until all has been taken care of with form.

## 2018-11-12 NOTE — Telephone Encounter (Signed)
Received form from fax. Form has been placed on TP's computer for her to sign.

## 2018-11-15 DIAGNOSIS — Z713 Dietary counseling and surveillance: Secondary | ICD-10-CM | POA: Diagnosis not present

## 2018-11-15 DIAGNOSIS — G43909 Migraine, unspecified, not intractable, without status migrainosus: Secondary | ICD-10-CM | POA: Diagnosis not present

## 2018-11-15 DIAGNOSIS — Z299 Encounter for prophylactic measures, unspecified: Secondary | ICD-10-CM | POA: Diagnosis not present

## 2018-11-15 DIAGNOSIS — Z6831 Body mass index (BMI) 31.0-31.9, adult: Secondary | ICD-10-CM | POA: Diagnosis not present

## 2018-11-15 NOTE — Telephone Encounter (Signed)
Called Nancy [name] with [place] to see if she has received the form back from our office and she stated she does not believe it has been sent back. I stated to her that we did receive the form 6/5 and it was placed for TP to sign.   Routing to [place] as she is working with TP today. Hinton Dyer, please advise when form has been taken care of by TP and returned back to [facility]. Thanks!

## 2018-11-16 DIAGNOSIS — Z1159 Encounter for screening for other viral diseases: Secondary | ICD-10-CM | POA: Diagnosis not present

## 2018-11-16 DIAGNOSIS — Z7409 Other reduced mobility: Secondary | ICD-10-CM | POA: Diagnosis not present

## 2018-11-16 DIAGNOSIS — M5106 Intervertebral disc disorders with myelopathy, lumbar region: Secondary | ICD-10-CM | POA: Diagnosis not present

## 2018-11-16 DIAGNOSIS — Z01812 Encounter for preprocedural laboratory examination: Secondary | ICD-10-CM | POA: Diagnosis not present

## 2018-11-16 DIAGNOSIS — Z01818 Encounter for other preprocedural examination: Secondary | ICD-10-CM | POA: Diagnosis not present

## 2018-11-17 NOTE — Telephone Encounter (Signed)
This would have been in the purple folder given Tammy yesterday. Can you confirm that she signed the form. I can come get it from you and fax it. Thanks.

## 2018-11-17 NOTE — Telephone Encounter (Signed)
Form still has not be signed. It is in the purple folder on TP's desk. Will try to get her to sign this today.

## 2018-11-17 NOTE — Telephone Encounter (Signed)
Form has been signed and faxed back to office.  Will close this encounter.

## 2018-11-23 DIAGNOSIS — R9431 Abnormal electrocardiogram [ECG] [EKG]: Secondary | ICD-10-CM | POA: Diagnosis not present

## 2018-11-23 DIAGNOSIS — F329 Major depressive disorder, single episode, unspecified: Secondary | ICD-10-CM | POA: Diagnosis not present

## 2018-11-23 DIAGNOSIS — M48062 Spinal stenosis, lumbar region with neurogenic claudication: Secondary | ICD-10-CM | POA: Diagnosis not present

## 2018-11-23 DIAGNOSIS — K219 Gastro-esophageal reflux disease without esophagitis: Secondary | ICD-10-CM | POA: Diagnosis not present

## 2018-11-23 DIAGNOSIS — Z881 Allergy status to other antibiotic agents status: Secondary | ICD-10-CM | POA: Diagnosis not present

## 2018-11-23 DIAGNOSIS — Z981 Arthrodesis status: Secondary | ICD-10-CM | POA: Diagnosis not present

## 2018-11-23 DIAGNOSIS — M48061 Spinal stenosis, lumbar region without neurogenic claudication: Secondary | ICD-10-CM | POA: Diagnosis not present

## 2018-11-23 DIAGNOSIS — M47817 Spondylosis without myelopathy or radiculopathy, lumbosacral region: Secondary | ICD-10-CM | POA: Diagnosis not present

## 2018-11-23 DIAGNOSIS — Z79899 Other long term (current) drug therapy: Secondary | ICD-10-CM | POA: Diagnosis not present

## 2018-11-23 DIAGNOSIS — M532X6 Spinal instabilities, lumbar region: Secondary | ICD-10-CM | POA: Diagnosis not present

## 2018-11-23 DIAGNOSIS — M4716 Other spondylosis with myelopathy, lumbar region: Secondary | ICD-10-CM | POA: Diagnosis not present

## 2018-11-23 DIAGNOSIS — M51369 Other intervertebral disc degeneration, lumbar region without mention of lumbar back pain or lower extremity pain: Secondary | ICD-10-CM | POA: Insufficient documentation

## 2018-11-23 DIAGNOSIS — G894 Chronic pain syndrome: Secondary | ICD-10-CM | POA: Diagnosis not present

## 2018-11-23 DIAGNOSIS — Z87891 Personal history of nicotine dependence: Secondary | ICD-10-CM | POA: Diagnosis not present

## 2018-11-23 DIAGNOSIS — Z7951 Long term (current) use of inhaled steroids: Secondary | ICD-10-CM | POA: Diagnosis not present

## 2018-11-23 DIAGNOSIS — Z888 Allergy status to other drugs, medicaments and biological substances status: Secondary | ICD-10-CM | POA: Diagnosis not present

## 2018-11-23 DIAGNOSIS — T84216A Breakdown (mechanical) of internal fixation device of vertebrae, initial encounter: Secondary | ICD-10-CM | POA: Diagnosis not present

## 2018-11-23 DIAGNOSIS — Z886 Allergy status to analgesic agent status: Secondary | ICD-10-CM | POA: Diagnosis not present

## 2018-11-23 DIAGNOSIS — Z9049 Acquired absence of other specified parts of digestive tract: Secondary | ICD-10-CM | POA: Diagnosis not present

## 2018-11-23 DIAGNOSIS — J4540 Moderate persistent asthma, uncomplicated: Secondary | ICD-10-CM | POA: Diagnosis not present

## 2018-11-23 DIAGNOSIS — M961 Postlaminectomy syndrome, not elsewhere classified: Secondary | ICD-10-CM | POA: Diagnosis not present

## 2018-11-23 DIAGNOSIS — G43909 Migraine, unspecified, not intractable, without status migrainosus: Secondary | ICD-10-CM | POA: Diagnosis not present

## 2018-11-23 DIAGNOSIS — M5136 Other intervertebral disc degeneration, lumbar region: Secondary | ICD-10-CM | POA: Insufficient documentation

## 2018-11-23 DIAGNOSIS — M47896 Other spondylosis, lumbar region: Secondary | ICD-10-CM | POA: Diagnosis not present

## 2018-11-23 DIAGNOSIS — M5416 Radiculopathy, lumbar region: Secondary | ICD-10-CM | POA: Diagnosis not present

## 2018-11-26 DIAGNOSIS — Z87891 Personal history of nicotine dependence: Secondary | ICD-10-CM | POA: Diagnosis not present

## 2018-11-26 DIAGNOSIS — M4326 Fusion of spine, lumbar region: Secondary | ICD-10-CM | POA: Diagnosis not present

## 2018-11-26 DIAGNOSIS — M4716 Other spondylosis with myelopathy, lumbar region: Secondary | ICD-10-CM | POA: Diagnosis not present

## 2018-11-26 DIAGNOSIS — R1084 Generalized abdominal pain: Secondary | ICD-10-CM | POA: Diagnosis not present

## 2018-11-26 DIAGNOSIS — G894 Chronic pain syndrome: Secondary | ICD-10-CM | POA: Diagnosis not present

## 2018-11-26 DIAGNOSIS — G43909 Migraine, unspecified, not intractable, without status migrainosus: Secondary | ICD-10-CM | POA: Diagnosis not present

## 2018-11-26 DIAGNOSIS — Z981 Arthrodesis status: Secondary | ICD-10-CM | POA: Diagnosis not present

## 2018-11-26 DIAGNOSIS — M5416 Radiculopathy, lumbar region: Secondary | ICD-10-CM | POA: Diagnosis not present

## 2018-11-26 DIAGNOSIS — M961 Postlaminectomy syndrome, not elsewhere classified: Secondary | ICD-10-CM | POA: Diagnosis not present

## 2018-11-26 DIAGNOSIS — Z7951 Long term (current) use of inhaled steroids: Secondary | ICD-10-CM | POA: Diagnosis not present

## 2018-11-26 DIAGNOSIS — N200 Calculus of kidney: Secondary | ICD-10-CM | POA: Diagnosis not present

## 2018-12-02 ENCOUNTER — Telehealth: Payer: Self-pay | Admitting: Pulmonary Disease

## 2018-12-02 NOTE — Telephone Encounter (Signed)
Pt had her back surgery 11/23/2018. (Spinal infusion) Because of that she was hoping she could start getting her injections at home. If so please write a Script for Rio Autoinjector, 30mg , q 8wks.  Please advise. Thank you! [place]

## 2018-12-02 NOTE — Telephone Encounter (Signed)
Called pt to see if she wanted to schedule her next appt. LM with VM. I asked her to cb and any of the schedulers can set up her appt.. Will wait for pt to call back.

## 2018-12-03 NOTE — Telephone Encounter (Signed)
Raymond Gurney, Please call [employer] and ask them to send the Fasenra Pen to her home address instead of our address. Refer to ph note 10/11/2018 for updates. Routing to inj pool.

## 2018-12-03 NOTE — Telephone Encounter (Signed)
Dr. Halford Chessman, Please disregard yesterday's message. I looked back through the telephone notes. You said back in May it was ok to give her own injs at home. Sorry to bother you.

## 2018-12-06 DIAGNOSIS — N200 Calculus of kidney: Secondary | ICD-10-CM | POA: Diagnosis not present

## 2018-12-06 DIAGNOSIS — M5416 Radiculopathy, lumbar region: Secondary | ICD-10-CM | POA: Diagnosis not present

## 2018-12-06 DIAGNOSIS — Z87891 Personal history of nicotine dependence: Secondary | ICD-10-CM | POA: Diagnosis not present

## 2018-12-06 DIAGNOSIS — M961 Postlaminectomy syndrome, not elsewhere classified: Secondary | ICD-10-CM | POA: Diagnosis not present

## 2018-12-06 DIAGNOSIS — M4326 Fusion of spine, lumbar region: Secondary | ICD-10-CM | POA: Diagnosis not present

## 2018-12-06 DIAGNOSIS — Z981 Arthrodesis status: Secondary | ICD-10-CM | POA: Diagnosis not present

## 2018-12-06 DIAGNOSIS — Z7951 Long term (current) use of inhaled steroids: Secondary | ICD-10-CM | POA: Diagnosis not present

## 2018-12-06 DIAGNOSIS — G43909 Migraine, unspecified, not intractable, without status migrainosus: Secondary | ICD-10-CM | POA: Diagnosis not present

## 2018-12-06 DIAGNOSIS — R1084 Generalized abdominal pain: Secondary | ICD-10-CM | POA: Diagnosis not present

## 2018-12-06 DIAGNOSIS — M4716 Other spondylosis with myelopathy, lumbar region: Secondary | ICD-10-CM | POA: Diagnosis not present

## 2018-12-06 DIAGNOSIS — G894 Chronic pain syndrome: Secondary | ICD-10-CM | POA: Diagnosis not present

## 2018-12-06 NOTE — Telephone Encounter (Signed)
Tammy can you please take care of this. I am working with a physician the entire week.

## 2018-12-07 NOTE — Telephone Encounter (Signed)
Yes, I ran out of time today, I'll do it as soon as I can 12/08/2018. I saw the note this morning but I had so much stuff I had to take care of first.  Pt shot is not due till wk after next.

## 2018-12-09 NOTE — Telephone Encounter (Signed)
Spoke with Whitney at [facility]. She said we would need a new script for the Fasenra Pen since pt will giving injs at home. VS when you get a chance please write a new script for: Nancy [name] Fasenra Pen 30mg   Given SubQ Q 8wks.  Then Ria Comment if you will put the pt's address on the script so [employer] will know to send the pen to pt's home.  Fax#: [phone] Will route to VS and [facility] Inj pool.

## 2018-12-14 MED ORDER — FASENRA PEN 30 MG/ML ~~LOC~~ SOAJ: 1.0000 mL | SUBCUTANEOUS | 11 refills | Status: DC

## 2018-12-14 NOTE — Telephone Encounter (Signed)
Rx has been sent to [employer] for the Fasenra Pen. Will mark on the prescription that the medication to be sent to the pt's home.

## 2018-12-14 NOTE — Telephone Encounter (Signed)
Terri, [employer], is calling to set up shipment of Fasenra. Cb is [phone]

## 2018-12-17 NOTE — Telephone Encounter (Signed)
I spoke with pt, she didn't receive her Berna Bue at home. [employer] sent her a letter saying sorry they missed her. Pt:" I don't know how that happened, because I'm here all the time since my back surgery. Pt also gave [employer] the tracking # that was on the letter they sent her. " Not our tracking #." ([employer]) I spoke with an [employer] rep, she said they didn't get the rx from our office. Ria Comment sent it on 12/14/2018.(Fasenra Pen) The rep found the script. When I told her about the letter she found the shipment was not sent on 12/05/2018 like it was supposed to have been. Rep verified pt's address and said she would process the order. I called the pt back to make her aware, she should be getting her Berna Bue some time next wk... Nothing further needed.

## 2018-12-23 DIAGNOSIS — M5416 Radiculopathy, lumbar region: Secondary | ICD-10-CM | POA: Diagnosis not present

## 2018-12-24 DIAGNOSIS — M545 Low back pain: Secondary | ICD-10-CM | POA: Diagnosis not present

## 2018-12-24 DIAGNOSIS — Z6831 Body mass index (BMI) 31.0-31.9, adult: Secondary | ICD-10-CM | POA: Diagnosis not present

## 2018-12-24 DIAGNOSIS — J45909 Unspecified asthma, uncomplicated: Secondary | ICD-10-CM | POA: Diagnosis not present

## 2018-12-24 DIAGNOSIS — E7800 Pure hypercholesterolemia, unspecified: Secondary | ICD-10-CM | POA: Diagnosis not present

## 2018-12-24 DIAGNOSIS — Z299 Encounter for prophylactic measures, unspecified: Secondary | ICD-10-CM | POA: Diagnosis not present

## 2019-01-05 DIAGNOSIS — M961 Postlaminectomy syndrome, not elsewhere classified: Secondary | ICD-10-CM | POA: Diagnosis not present

## 2019-01-07 ENCOUNTER — Ambulatory Visit: Payer: PPO | Admitting: Pulmonary Disease

## 2019-01-20 ENCOUNTER — Other Ambulatory Visit: Payer: Self-pay | Admitting: Pulmonary Disease

## 2019-01-24 MED ORDER — BUDESONIDE-FORMOTEROL FUMARATE 160-4.5 MCG/ACT IN AERO: INHALATION_SPRAY | RESPIRATORY_TRACT | 5 refills | Status: DC

## 2019-01-24 NOTE — Addendum Note (Signed)
Addended by: Valerie Salts on: 01/24/2019 10:44 AM   Modules accepted: Orders

## 2019-01-25 NOTE — Progress Notes (Signed)
Reviewed and agree with assessment/plan.   Jenkins Risdon, MD [facility] 06/04/2016, 12:24 PM Pager:  [phone]  

## 2019-01-26 ENCOUNTER — Encounter: Payer: Self-pay | Admitting: Pulmonary Disease

## 2019-01-26 ENCOUNTER — Ambulatory Visit (INDEPENDENT_AMBULATORY_CARE_PROVIDER_SITE_OTHER): Payer: PPO | Admitting: Pulmonary Disease

## 2019-01-26 ENCOUNTER — Other Ambulatory Visit: Payer: Self-pay

## 2019-01-26 VITALS — BP 130/90 | Temp 98.6°F | Ht 65.0 in | Wt 176.6 lb

## 2019-01-26 DIAGNOSIS — J3089 Other allergic rhinitis: Secondary | ICD-10-CM

## 2019-01-26 DIAGNOSIS — Z23 Encounter for immunization: Secondary | ICD-10-CM | POA: Diagnosis not present

## 2019-01-26 DIAGNOSIS — J8283 Eosinophilic asthma: Secondary | ICD-10-CM

## 2019-01-26 DIAGNOSIS — J82 Pulmonary eosinophilia, not elsewhere classified: Secondary | ICD-10-CM | POA: Diagnosis not present

## 2019-01-26 NOTE — Patient Instructions (Signed)
Flu shot today Follow up in 6 months 

## 2019-01-26 NOTE — Progress Notes (Signed)
Nancy [name], Nancy [name], Nancy [name]  Chief Complaint  Patient presents with  . Follow-up    Asthma. Currently getting Fasenra injections. She reports her breathing has been at her baseline. Symbicort daily Nancy albuterol prn.     Constitutional:  BP 130/90   Temp 98.6 F (37 C) (Temporal)   Ht 5\' 5"  (1.651 m)   Wt 176 lb 9.6 oz (80.1 kg)   BMI 29.39 kg/m   Past Medical History:  Serotonin Syndrome, PNA 1986, Nephrolithiasis, Migraine HA, Memory loss, GERD with HH, Fibromyalgia, HLD, DJD, Bipolar, Tremor  Brief Summary:  Nancy [name] is a 53 y.o. female former smoker with allergic asthma with eosinophilic phenotype.  She had back surgery recently with Dr. Patrice Paradise.  Recovering well.  Still has to wear a back brace.  Breathing has been better.  Only needing albuterol few times per week.  Not having cough, wheeze, sputum, chest congestion.  Only problem with breathing is when she goes in hot, humid weather.  Physical Exam:   Appearance - well kempt   ENMT - clear nasal mucosa, midline nasal  septum, no oral exudates, no LAN, trachea midline  Respiratory - normal chest wall, normal respiratory effort, no accessory muscle use, no wheeze/rales  CV - s1s2 regular rate Nancy rhythm, no murmurs, no peripheral edema, radial pulses symmetric  GI - soft, non tender, no masses  Lymph - no adenopathy noted in neck Nancy axillary areas  MSK - normal gait, wearing a back brace  Ext - no cyanosis, clubbing, or joint inflammation noted  Skin - no rashes, lesions, or ulcers  Neuro - normal strength, oriented x 3  Psych - normal mood Nancy affect   Assessment/Plan:   Severe, persistent allergic asthma with eosinophilic phenotype Nancy upper airway cough from allergic rhinitis. - much improved since starting fasenra - she has epi pen - continue symbicort, signulair, zyrtec - reassess maintenance medication regimen at next visit, with possibility of tapering down some of  her medications - prn albuterol - flu shot today   Patient Instructions  Flu shot today  Follow up in 6 months    Chesley Mires, MD [place] Pager: [phone] 01/26/2019, 12:42 PM  Flow Sheet     [name] tests:  Spirometry 03/05/10>>FEV1 2.43(87%), FEV1% 93 IgE 2008>>412 IgE 04/01/11>>162.1 Xolair from 11/23/07 to 03/05/10>>stopped due to insurance issues RAST 04/01/11>>Cat dander, dog dander, dust mites PFT 05/28/11>>FEV1 3.01(112%), FEV1% 78, TLC 5.64(112%), RV 2.28(135%), DLCO 99%, positive bronchodilator response. December 2012 >> resumed xolair >> stopped Summer 2014 due to finances >> resumed September 2014 January 2016 >> xolair d/c'ed due to finances FeNO 08/19/16 >> 10 Spirometry 08/19/16 >> FEV1 2.49 (84%), FEV1% 79 Labs 08/19/16 >> IgE 120   Medications:   Allergies as of 01/26/2019      Reactions   Cephalexin    Cephalosporins    Cymbalta [duloxetine Hcl]    Divalproex Sodium    Effexor [venlafaxine Hydrochloride]    Geodon [ziprasidone Hydrochloride]    Hydrocodone    Pt states she is able to take in liquid/hydromet form   Prozac [fluoxetine Hcl]    Theo-dur [theophylline]    Thorazine [chlorpromazine Hcl]    Topamax       Medication List       Accurate as of January 26, 2019 12:42 PM. If you have any questions, ask your nurse or doctor.        albuterol (2.5 MG/3ML) 0.083% nebulizer solution Commonly known as:  PROVENTIL Take 2.5 mg by nebulization every 6 (six) hours as needed.   albuterol 108 (90 Base) MCG/ACT inhaler Commonly known as: VENTOLIN HFA Inhale 2 puffs into the lungs every 6 (six) hours as needed.   atorvastatin 10 MG tablet Commonly known as: LIPITOR Take 10 mg by mouth daily.   budesonide-formoterol 160-4.5 MCG/ACT inhaler Commonly known as: SYMBICORT Inhale 2 puffs twice daily   buPROPion 100 MG tablet Commonly known as: WELLBUTRIN Take 100 mg by mouth daily.   cetirizine 10 MG tablet  Commonly known as: ZYRTEC Take 10 mg by mouth daily.   cyclobenzaprine 10 MG tablet Commonly known as: FLEXERIL Take 10 mg by mouth 2 (two) times daily as needed for muscle spasms.   docusate sodium 100 MG capsule Commonly known as: COLACE Take 200 mg by mouth daily.   Fasenra Pen 30 MG/ML Soaj Generic drug: Benralizumab Inject 1 mL into the skin every 8 (eight) weeks.   gabapentin 300 MG capsule Commonly known as: NEURONTIN Take 300 mg by mouth daily as needed.   HYDROcodone-acetaminophen 10-325 MG tablet Commonly known as: NORCO Take 1 tablet by mouth 3 (three) times daily.   HYDROcodone-homatropine 5-1.5 MG/5ML syrup Commonly known as: Hydromet Take 5 mLs by mouth every 4 (four) hours as needed for cough.   lamoTRIgine 200 MG tablet Commonly known as: LAMICTAL Take 100 mg by mouth daily.   montelukast 10 MG tablet Commonly known as: SINGULAIR Take 10 mg by mouth at bedtime.   pantoprazole 40 MG tablet Commonly known as: PROTONIX Take 40 mg by mouth daily.   TEGretol-XR 100 MG 12 hr tablet Generic drug: carbamazepine Take 300 mg by mouth at bedtime.       Past Surgical History:  She  has a past surgical history that includes Laparoscopic cholecystectomy; MICRODISSECTION L5-S1 (August 13, 2000); Appendectomy; Tonsillectomy; back fusion; Ankle fracture surgery (2006); Esophageal dilation (August 06, 2004); Cardiac catheterization (November 27, 2006); Bronchoscopy; Laser ablation of the cervix; Nancy Back surgery.  Family History:  Her family history includes Allergies in her brother, father, Nancy mother; Chorea in her maternal grandmother; Clotting disorder in her mother; Diabetes in her father Nancy mother; Heart attack in her father; Heart disease in her father.  Social History:  She  reports that she quit smoking about 40 years ago. Her smoking use included cigarettes. She has a 4.00 pack-year smoking history. She has never used smokeless tobacco. She reports that she  does not drink alcohol or use drugs.

## 2019-01-26 NOTE — Addendum Note (Signed)
Addended by: Vivia Ewing on: 01/26/2019 01:21 PM   Modules accepted: Orders

## 2019-02-17 DIAGNOSIS — M4326 Fusion of spine, lumbar region: Secondary | ICD-10-CM | POA: Diagnosis not present

## 2019-02-28 DIAGNOSIS — R350 Frequency of micturition: Secondary | ICD-10-CM | POA: Diagnosis not present

## 2019-02-28 DIAGNOSIS — Z6831 Body mass index (BMI) 31.0-31.9, adult: Secondary | ICD-10-CM | POA: Diagnosis not present

## 2019-02-28 DIAGNOSIS — Z299 Encounter for prophylactic measures, unspecified: Secondary | ICD-10-CM | POA: Diagnosis not present

## 2019-02-28 DIAGNOSIS — N390 Urinary tract infection, site not specified: Secondary | ICD-10-CM | POA: Diagnosis not present

## 2019-02-28 DIAGNOSIS — Z713 Dietary counseling and surveillance: Secondary | ICD-10-CM | POA: Diagnosis not present

## 2019-03-03 DIAGNOSIS — Z1231 Encounter for screening mammogram for malignant neoplasm of breast: Secondary | ICD-10-CM | POA: Diagnosis not present

## 2019-03-31 DIAGNOSIS — Z713 Dietary counseling and surveillance: Secondary | ICD-10-CM | POA: Diagnosis not present

## 2019-03-31 DIAGNOSIS — Z299 Encounter for prophylactic measures, unspecified: Secondary | ICD-10-CM | POA: Diagnosis not present

## 2019-03-31 DIAGNOSIS — Z6831 Body mass index (BMI) 31.0-31.9, adult: Secondary | ICD-10-CM | POA: Diagnosis not present

## 2019-03-31 DIAGNOSIS — F319 Bipolar disorder, unspecified: Secondary | ICD-10-CM | POA: Diagnosis not present

## 2019-03-31 DIAGNOSIS — Z1331 Encounter for screening for depression: Secondary | ICD-10-CM | POA: Diagnosis not present

## 2019-03-31 DIAGNOSIS — J8283 Eosinophilic asthma: Secondary | ICD-10-CM | POA: Diagnosis not present

## 2019-04-19 DIAGNOSIS — H5212 Myopia, left eye: Secondary | ICD-10-CM | POA: Diagnosis not present

## 2019-04-19 DIAGNOSIS — H52223 Regular astigmatism, bilateral: Secondary | ICD-10-CM | POA: Diagnosis not present

## 2019-04-19 DIAGNOSIS — H5201 Hypermetropia, right eye: Secondary | ICD-10-CM | POA: Diagnosis not present

## 2019-05-20 DIAGNOSIS — M4326 Fusion of spine, lumbar region: Secondary | ICD-10-CM | POA: Diagnosis not present

## 2019-05-20 DIAGNOSIS — M4716 Other spondylosis with myelopathy, lumbar region: Secondary | ICD-10-CM | POA: Diagnosis not present

## 2019-05-30 DIAGNOSIS — F319 Bipolar disorder, unspecified: Secondary | ICD-10-CM | POA: Diagnosis not present

## 2019-05-30 DIAGNOSIS — J069 Acute upper respiratory infection, unspecified: Secondary | ICD-10-CM | POA: Diagnosis not present

## 2019-05-30 DIAGNOSIS — Z299 Encounter for prophylactic measures, unspecified: Secondary | ICD-10-CM | POA: Diagnosis not present

## 2019-05-30 DIAGNOSIS — Z87891 Personal history of nicotine dependence: Secondary | ICD-10-CM | POA: Diagnosis not present

## 2019-05-30 DIAGNOSIS — J45909 Unspecified asthma, uncomplicated: Secondary | ICD-10-CM | POA: Diagnosis not present

## 2019-06-01 DIAGNOSIS — Z299 Encounter for prophylactic measures, unspecified: Secondary | ICD-10-CM | POA: Diagnosis not present

## 2019-06-01 DIAGNOSIS — F319 Bipolar disorder, unspecified: Secondary | ICD-10-CM | POA: Diagnosis not present

## 2019-06-01 DIAGNOSIS — U071 COVID-19: Secondary | ICD-10-CM | POA: Diagnosis not present

## 2019-06-01 DIAGNOSIS — J45909 Unspecified asthma, uncomplicated: Secondary | ICD-10-CM | POA: Diagnosis not present

## 2019-06-01 DIAGNOSIS — Z6829 Body mass index (BMI) 29.0-29.9, adult: Secondary | ICD-10-CM | POA: Diagnosis not present

## 2019-06-07 DIAGNOSIS — Z20828 Contact with and (suspected) exposure to other viral communicable diseases: Secondary | ICD-10-CM | POA: Diagnosis not present

## 2019-08-12 DIAGNOSIS — Z6831 Body mass index (BMI) 31.0-31.9, adult: Secondary | ICD-10-CM | POA: Diagnosis not present

## 2019-08-12 DIAGNOSIS — Z713 Dietary counseling and surveillance: Secondary | ICD-10-CM | POA: Diagnosis not present

## 2019-08-12 DIAGNOSIS — Z299 Encounter for prophylactic measures, unspecified: Secondary | ICD-10-CM | POA: Diagnosis not present

## 2019-08-12 DIAGNOSIS — M25551 Pain in right hip: Secondary | ICD-10-CM | POA: Diagnosis not present

## 2019-09-08 DIAGNOSIS — J45909 Unspecified asthma, uncomplicated: Secondary | ICD-10-CM | POA: Diagnosis not present

## 2019-09-08 DIAGNOSIS — J329 Chronic sinusitis, unspecified: Secondary | ICD-10-CM | POA: Diagnosis not present

## 2019-09-08 DIAGNOSIS — Z299 Encounter for prophylactic measures, unspecified: Secondary | ICD-10-CM | POA: Diagnosis not present

## 2019-09-26 DIAGNOSIS — M25551 Pain in right hip: Secondary | ICD-10-CM | POA: Diagnosis not present

## 2019-09-26 DIAGNOSIS — Z299 Encounter for prophylactic measures, unspecified: Secondary | ICD-10-CM | POA: Diagnosis not present

## 2019-09-26 DIAGNOSIS — F319 Bipolar disorder, unspecified: Secondary | ICD-10-CM | POA: Diagnosis not present

## 2019-09-30 DIAGNOSIS — R5383 Other fatigue: Secondary | ICD-10-CM | POA: Diagnosis not present

## 2019-09-30 DIAGNOSIS — Z299 Encounter for prophylactic measures, unspecified: Secondary | ICD-10-CM | POA: Diagnosis not present

## 2019-09-30 DIAGNOSIS — Z1211 Encounter for screening for malignant neoplasm of colon: Secondary | ICD-10-CM | POA: Diagnosis not present

## 2019-09-30 DIAGNOSIS — Z79899 Other long term (current) drug therapy: Secondary | ICD-10-CM | POA: Diagnosis not present

## 2019-09-30 DIAGNOSIS — Z1331 Encounter for screening for depression: Secondary | ICD-10-CM | POA: Diagnosis not present

## 2019-09-30 DIAGNOSIS — E7800 Pure hypercholesterolemia, unspecified: Secondary | ICD-10-CM | POA: Diagnosis not present

## 2019-09-30 DIAGNOSIS — Z0000 Encounter for general adult medical examination without abnormal findings: Secondary | ICD-10-CM | POA: Diagnosis not present

## 2019-09-30 DIAGNOSIS — Z7189 Other specified counseling: Secondary | ICD-10-CM | POA: Diagnosis not present

## 2019-09-30 DIAGNOSIS — Z6828 Body mass index (BMI) 28.0-28.9, adult: Secondary | ICD-10-CM | POA: Diagnosis not present

## 2019-09-30 DIAGNOSIS — Z1339 Encounter for screening examination for other mental health and behavioral disorders: Secondary | ICD-10-CM | POA: Diagnosis not present

## 2019-10-03 DIAGNOSIS — J301 Allergic rhinitis due to pollen: Secondary | ICD-10-CM | POA: Diagnosis not present

## 2019-12-01 ENCOUNTER — Other Ambulatory Visit: Payer: Self-pay | Admitting: Pulmonary Disease

## 2019-12-05 ENCOUNTER — Ambulatory Visit: Payer: PPO | Admitting: Pulmonary Disease

## 2019-12-30 ENCOUNTER — Ambulatory Visit: Payer: PPO | Admitting: Adult Health

## 2019-12-30 ENCOUNTER — Other Ambulatory Visit: Payer: Self-pay

## 2019-12-30 ENCOUNTER — Encounter: Payer: Self-pay | Admitting: Adult Health

## 2019-12-30 DIAGNOSIS — J4550 Severe persistent asthma, uncomplicated: Secondary | ICD-10-CM

## 2019-12-30 DIAGNOSIS — J309 Allergic rhinitis, unspecified: Secondary | ICD-10-CM | POA: Insufficient documentation

## 2019-12-30 NOTE — Assessment & Plan Note (Signed)
Severe persistent asthma with good control on current aggressive regimen with Symbicort, Singulair, Zyrtec, Fasenra  Plan  Patient Instructions  Continue on current regimen  Follow up with Dr. Halford Chessman  In 6 months and As needed   Please contact office for sooner follow up if symptoms do not improve or worsen or seek emergency care

## 2019-12-30 NOTE — Progress Notes (Signed)
'@Patient'  ID: Nancy [name], female    DOB: 01-20-1966, 54 y.o.   MRN: [id number]  Chief Complaint  Patient presents with  . Follow-up    Asthma     Referring provider: Glenda Chroman, MD  HPI: 54 yo female former smoker with allergic asthma with eosinophilic phenotype.   TEST/EVENTS :  CXR 11/2018 clear lungs   Spirometry 03/05/10>>FEV1 2.43(87%), FEV1% 93 IgE 2008>>412 IgE 04/01/11>>162.1 Xolair from 11/23/07 to 03/05/10>>stopped due to insurance issues RAST 04/01/11>>Cat dander, dog dander, dust mites PFT 05/28/11>>FEV1 3.01(112%), FEV1% 78, TLC 5.64(112%), RV 2.28(135%), DLCO 99%, positive bronchodilator response. December 2012 >> resumed xolair >> stopped Summer 2014 due to finances >> resumed September 2014 January 2016 >> xolair d/c'ed due to finances FeNO 08/19/16 >> 10 Spirometry 08/19/16 >> FEV1 2.49 (84%), FEV1% 79 Labs 08/19/16 >> IgE 120  12/30/2019 Follow up: Allergic Asthma  Patient presents for a follow-up visit.  She says overall her asthma has been doing well.  She remains on Symbicort twice daily, Singulair Zyrtec daily.  She is on [place] home injections twice monthly.  Has a EpiPen that she carries with her at all times.  EpiPen has current date.. She denies any injection site rashes any issues with home injections.  She denies any flare of her cough or wheezing.  Says she is very active.  She keeps her 61-year-old granddaughter.    Allergies  Allergen Reactions  . Cephalexin   . Cephalosporins   . Cymbalta [Duloxetine Hcl]   . Divalproex Sodium   . Effexor [Venlafaxine Hydrochloride]   . Geodon [Ziprasidone Hydrochloride]   . Hydrocodone     Pt states she is able to take in liquid/hydromet form  . Prozac [Fluoxetine Hcl]   . Theo-Dur [Theophylline]   . Thorazine [Chlorpromazine Hcl]   . Topamax     Immunization History  Administered Date(s) Administered  . Influenza Split 02/19/2011  . Influenza Whole 03/09/2012  . Influenza,inj,Quad PF,6+ Mos  03/08/2013, 03/01/2014, 02/08/2015, 02/04/2016, 03/03/2017, 01/26/2019  . Moderna SARS-COVID-2 Vaccination 12/15/2019    Past Medical History:  Diagnosis Date  . Allergic rhinitis   . Asthma   . Benign familial tremor   . Bipolar 1 disorder ([place])   . Chronic neck and back pain   . Degenerative disk disease   . Dyslipidemia   . Fibromyalgia   . GERD (gastroesophageal reflux disease)   . Hiatal hernia   . Irritable bowel syndrome (IBS)   . Memory disorder 07/18/2014  . Memory loss   . Migraine headache   . Nephrolithiasis   . Pneumonia 1986  . Serotonin syndrome     Tobacco History: Social History   Tobacco Use  Smoking Status Former Smoker  . Packs/day: 0.50  . Years: 8.00  . Pack years: 4.00  . Types: Cigarettes  . Quit date: 06/09/1978  . Years since quitting: 41.5  Smokeless Tobacco Never Used   Counseling given: Not Answered   Outpatient Medications Prior to Visit  Medication Sig Dispense Refill  . albuterol (PROVENTIL HFA;VENTOLIN HFA) 108 (90 BASE) MCG/ACT inhaler Inhale 2 puffs into the lungs every 6 (six) hours as needed. 1 Inhaler 5  . albuterol (PROVENTIL) (2.5 MG/3ML) 0.083% nebulizer solution Take 2.5 mg by nebulization every 6 (six) hours as needed.      [name] atorvastatin (LIPITOR) 10 MG tablet Take 10 mg by mouth daily.    . Benralizumab (FASENRA PEN) 30 MG/ML SOAJ Inject 1 mL into the skin every 8 (eight)  weeks. 1 mL 11  . budesonide-formoterol (SYMBICORT) 160-4.5 MCG/ACT inhaler TAKE 2 PUFFS BY MOUTH TWICE A DAY 30.6 Inhaler 3  . buPROPion (WELLBUTRIN) 100 MG tablet Take 100 mg by mouth daily.    . carbamazepine (TEGRETOL-XR) 100 MG 12 hr tablet Take 300 mg by mouth at bedtime.     . cetirizine (ZYRTEC) 10 MG tablet Take 10 mg by mouth daily.    [name] docusate sodium (COLACE) 100 MG capsule Take 200 mg by mouth daily.    [name] HYDROcodone-homatropine (HYDROMET) 5-1.5 MG/5ML syrup Take 5 mLs by mouth every 4 (four) hours as needed for cough. 240 mL 0  . lamoTRIgine  (LAMICTAL) 200 MG tablet Take 100 mg by mouth daily.     . montelukast (SINGULAIR) 10 MG tablet Take 10 mg by mouth at bedtime.     . pantoprazole (PROTONIX) 40 MG tablet Take 40 mg by mouth daily.    . cyclobenzaprine (FLEXERIL) 10 MG tablet Take 10 mg by mouth 2 (two) times daily as needed for muscle spasms.     [name] gabapentin (NEURONTIN) 300 MG capsule Take 300 mg by mouth daily as needed.     [name] HYDROcodone-acetaminophen (NORCO) 10-325 MG per tablet Take 1 tablet by mouth 3 (three) times daily.     No facility-administered medications prior to visit.     Review of Systems:   Constitutional:   No  weight loss, night sweats,  Fevers, chills, fatigue, or  lassitude.  HEENT:   No headaches,  Difficulty swallowing,  Tooth/dental problems, or  Sore throat,                No sneezing, itching, ear ache, nasal congestion, post nasal drip,   CV:  No chest pain,  Orthopnea, PND, swelling in lower extremities, anasarca, dizziness, palpitations, syncope.   GI  No heartburn, indigestion, abdominal pain, nausea, vomiting, diarrhea, change in bowel habits, loss of appetite, bloody stools.   Resp: No shortness of breath with exertion or at rest.  No excess mucus, no productive cough,  No non-productive cough,  No coughing up of blood.  No change in color of mucus.  No wheezing.  No chest wall deformity  Skin: no rash or lesions.  GU: no dysuria, change in color of urine, no urgency or frequency.  No flank pain, no hematuria   MS:  No joint pain or swelling.  No decreased range of motion.  No back pain.    Physical Exam  BP 110/72 (BP Location: Left Arm, Cuff Size: Normal)   Pulse 76   Temp 97.6 F (36.4 C) (Oral)   Ht '5\' 5"'  (1.651 m)   Wt 156 lb (70.8 kg)   SpO2 96%   BMI 25.96 kg/m   GEN: A/Ox3; pleasant , NAD, well nourished    HEENT:  [place]/AT,   NOSE-clear, THROAT-clear, no lesions, no postnasal drip or exudate noted.   NECK:  Supple w/ fair ROM; no JVD; normal carotid impulses w/o  bruits; no thyromegaly or nodules palpated; no lymphadenopathy.    RESP  Clear  P & A; w/o, wheezes/ rales/ or rhonchi. no accessory muscle use, no dullness to percussion  CARD:  RRR, no m/r/g, no peripheral edema, pulses intact, no cyanosis or clubbing.  GI:   Soft & nt; nml bowel sounds; no organomegaly or masses detected.   Musco: Warm bil, no deformities or joint swelling noted.   Neuro: alert, no focal deficits noted.    Skin: Warm, no lesions  or rashes    Lab Results:  CBC    Component Value Date/Time   WBC 7.7 08/19/2016 1228   RBC 4.74 08/19/2016 1228   HGB 15.1 (H) 08/19/2016 1228   HCT 44.4 08/19/2016 1228   PLT 297.0 08/19/2016 1228   MCV 93.7 08/19/2016 1228   MCH 30.7 06/29/2010 1353   MCHC 34.1 08/19/2016 1228   RDW 14.3 08/19/2016 1228   LYMPHSABS 1.5 08/19/2016 1228   MONOABS 0.6 08/19/2016 1228   EOSABS 0.2 08/19/2016 1228   BASOSABS 0.1 08/19/2016 1228    BMET    Component Value Date/Time   NA 136 06/29/2010 1353   K 3.5 06/29/2010 1353   CL 102 06/29/2010 1353   CO2 24 06/29/2010 1353   GLUCOSE 119 (H) 06/29/2010 1353   BUN 14 06/29/2010 1353   CREATININE 0.78 06/29/2010 1353   CALCIUM 9.1 06/29/2010 1353   GFRNONAA >60 06/29/2010 1353   GFRAA  06/29/2010 1353    >60        The eGFR has been calculated using the MDRD equation. This calculation has not been validated in all clinical situations. eGFR's persistently <60 mL/min signify possible Chronic Kidney Disease.    BNP No results found for: BNP  ProBNP No results found for: PROBNP  Imaging: No results found.    PFT Results Latest Ref Rng & Units 01/17/2014  FVC-Pre L 2.15  FVC-Predicted Pre % 61  FVC-Post L 2.94  FVC-Predicted Post % 84  Pre FEV1/FVC % % 61  Post FEV1/FCV % % 78  FEV1-Pre L 1.30  FEV1-Predicted Pre % 46  FEV1-Post L 2.28  DLCO UNC% % 102  DLCO COR %Predicted % 116  TLC L 4.61  TLC % Predicted % 94  RV % Predicted % 135    Lab Results    Component Value Date   NITRICOXIDE 10 08/19/2016        Assessment & Plan:   Severe persistent asthma in adult without complication Severe persistent asthma with good control on current aggressive regimen with Symbicort, Singulair, Zyrtec, Fasenra  Plan  Patient Instructions  Continue on current regimen  Follow up with Dr. Halford Chessman  In 6 months and As needed   Please contact office for sooner follow up if symptoms do not improve or worsen or seek emergency care       Allergic rhinitis Continue on current regimen good control     Thecla Forgione, NP 12/30/2019

## 2019-12-30 NOTE — Assessment & Plan Note (Signed)
Continue on current regimen good control

## 2019-12-30 NOTE — Patient Instructions (Signed)
Continue on current regimen  Follow up with Dr. Halford Chessman  In 6 months and As needed   Please contact office for sooner follow up if symptoms do not improve or worsen or seek emergency care

## 2020-01-02 NOTE — Progress Notes (Signed)
Reviewed and agree with assessment/plan.   Loran Auguste, MD [facility] 01/02/2020, 7:23 AM Pager:  [phone]  

## 2020-02-14 DIAGNOSIS — M5412 Radiculopathy, cervical region: Secondary | ICD-10-CM | POA: Diagnosis not present

## 2020-02-14 DIAGNOSIS — M4716 Other spondylosis with myelopathy, lumbar region: Secondary | ICD-10-CM | POA: Diagnosis not present

## 2020-02-14 DIAGNOSIS — M5106 Intervertebral disc disorders with myelopathy, lumbar region: Secondary | ICD-10-CM | POA: Diagnosis not present

## 2020-02-14 DIAGNOSIS — M542 Cervicalgia: Secondary | ICD-10-CM | POA: Diagnosis not present

## 2020-02-15 ENCOUNTER — Telehealth: Payer: Self-pay | Admitting: Pulmonary Disease

## 2020-02-18 NOTE — Telephone Encounter (Signed)
Called and spoke with Patient. Patient stated she has not received Fasenra since 08/2019.  Patient does home/self injections.  Patient stated she did have Patient assistance with [employer].  Patient stated she had not contacted [employer], because she did not have the contact number. [employer] contact number given to Patient.  Explained to Patient that she may need to redo her Patient assistance for this year.  Explained to Patient to contact office if anything further is needed from [facility] or injection team. Nothing further at this time.

## 2020-02-22 ENCOUNTER — Other Ambulatory Visit: Payer: Self-pay | Admitting: Orthopaedic Surgery

## 2020-02-22 DIAGNOSIS — M5412 Radiculopathy, cervical region: Secondary | ICD-10-CM

## 2020-03-09 DIAGNOSIS — M25552 Pain in left hip: Secondary | ICD-10-CM | POA: Diagnosis not present

## 2020-03-09 DIAGNOSIS — Z299 Encounter for prophylactic measures, unspecified: Secondary | ICD-10-CM | POA: Diagnosis not present

## 2020-03-09 DIAGNOSIS — F319 Bipolar disorder, unspecified: Secondary | ICD-10-CM | POA: Diagnosis not present

## 2020-03-09 DIAGNOSIS — J45909 Unspecified asthma, uncomplicated: Secondary | ICD-10-CM | POA: Diagnosis not present

## 2020-03-26 ENCOUNTER — Telehealth: Payer: Self-pay | Admitting: Pulmonary Disease

## 2020-03-26 MED ORDER — FASENRA PEN 30 MG/ML ~~LOC~~ SOAJ: 1.0000 mL | SUBCUTANEOUS | 5 refills | Status: DC

## 2020-03-26 NOTE — Telephone Encounter (Signed)
Nancy [name] prescription sent in to [employer] for home delivery. Patient aware new prescription for home delivery has been sent in.  Nothing further at this time.

## 2020-04-30 DIAGNOSIS — Z299 Encounter for prophylactic measures, unspecified: Secondary | ICD-10-CM | POA: Diagnosis not present

## 2020-04-30 DIAGNOSIS — K219 Gastro-esophageal reflux disease without esophagitis: Secondary | ICD-10-CM | POA: Diagnosis not present

## 2020-04-30 DIAGNOSIS — M25552 Pain in left hip: Secondary | ICD-10-CM | POA: Diagnosis not present

## 2020-04-30 DIAGNOSIS — J45909 Unspecified asthma, uncomplicated: Secondary | ICD-10-CM | POA: Diagnosis not present

## 2020-06-13 DIAGNOSIS — Z01419 Encounter for gynecological examination (general) (routine) without abnormal findings: Secondary | ICD-10-CM | POA: Diagnosis not present

## 2020-06-13 DIAGNOSIS — M5136 Other intervertebral disc degeneration, lumbar region: Secondary | ICD-10-CM | POA: Diagnosis not present

## 2020-06-13 DIAGNOSIS — J45909 Unspecified asthma, uncomplicated: Secondary | ICD-10-CM | POA: Diagnosis not present

## 2020-06-13 DIAGNOSIS — Z975 Presence of (intrauterine) contraceptive device: Secondary | ICD-10-CM | POA: Diagnosis not present

## 2020-06-19 ENCOUNTER — Ambulatory Visit (INDEPENDENT_AMBULATORY_CARE_PROVIDER_SITE_OTHER): Payer: PPO | Admitting: Orthopaedic Surgery

## 2020-06-19 ENCOUNTER — Ambulatory Visit: Payer: PPO

## 2020-06-19 ENCOUNTER — Other Ambulatory Visit: Payer: Self-pay

## 2020-06-19 ENCOUNTER — Encounter: Payer: Self-pay | Admitting: Orthopaedic Surgery

## 2020-06-19 VITALS — BP 145/85 | HR 90 | Ht 65.0 in | Wt 165.0 lb

## 2020-06-19 DIAGNOSIS — G8929 Other chronic pain: Secondary | ICD-10-CM | POA: Diagnosis not present

## 2020-06-19 DIAGNOSIS — M25561 Pain in right knee: Secondary | ICD-10-CM

## 2020-06-19 NOTE — Progress Notes (Signed)
Patient Nancy [name], female DOB:1965/06/12, 55 y.o. KZS:[id number]  Chief Complaint  Patient presents with  . Knee Pain    Right     HPI  Nancy [name] is a 55 y.o. female who has developed pain in the right knee, medially, with locking on several occassions.  She was able to unlock the knee every time.  She has giving way and some swelling.  She is taking Mobic 15 which helps.  She has no trauma, no redness.  It is not getting better at all.   Body mass index is 27.46 kg/m.  ROS  Review of Systems  Constitutional: Positive for activity change.  Respiratory: Positive for shortness of breath.   Musculoskeletal: Positive for gait problem and joint swelling.  All other systems reviewed and are negative.  For Review of Systems, all other systems reviewed and are negative.  The following is a summary of the past history medically, past history surgically, known current medicines, social history and family history.  This information is gathered electronically by the computer from prior information and documentation.  I review this each visit and have found including this information at this point in the chart is beneficial and informative.   Past Medical History:  Diagnosis Date  . Allergic rhinitis   . Asthma   . Benign familial tremor   . Bipolar 1 disorder ([place])   . Chronic neck and back pain   . Degenerative disk disease   . Dyslipidemia   . Fibromyalgia   . GERD (gastroesophageal reflux disease)   . Hiatal hernia   . Irritable bowel syndrome (IBS)   . Memory disorder 07/18/2014  . Memory loss   . Migraine headache   . Nephrolithiasis   . Pneumonia 1986  . Serotonin syndrome     Past Surgical History:  Procedure Laterality Date  . ANKLE FRACTURE SURGERY  2006   Right  . APPENDECTOMY    . back fusion    . BACK SURGERY    . BRONCHOSCOPY     S. aureus from BAL  . CARDIAC CATHETERIZATION  November 27, 2006   minimal CAD  . ESOPHAGEAL DILATION  August 06, 2004   . LAPAROSCOPIC CHOLECYSTECTOMY    . LASER ABLATION OF THE CERVIX    . MICRODISSECTION L5-S1  August 13, 2000  . TONSILLECTOMY      Current Outpatient Medications on File Prior to Visit  Medication Sig Dispense Refill  . albuterol (PROVENTIL HFA;VENTOLIN HFA) 108 (90 BASE) MCG/ACT inhaler Inhale 2 puffs into the lungs every 6 (six) hours as needed. 1 Inhaler 5  . albuterol (PROVENTIL) (2.5 MG/3ML) 0.083% nebulizer solution Take 2.5 mg by nebulization every 6 (six) hours as needed.    [name] atorvastatin (LIPITOR) 10 MG tablet Take 10 mg by mouth daily.    . Benralizumab (FASENRA PEN) 30 MG/ML SOAJ Inject 1 mL into the skin every 8 (eight) weeks. 1 mL 5  . budesonide-formoterol (SYMBICORT) 160-4.5 MCG/ACT inhaler TAKE 2 PUFFS BY MOUTH TWICE A DAY 30.6 Inhaler 3  . buPROPion (WELLBUTRIN) 100 MG tablet Take 100 mg by mouth daily.    . carbamazepine (TEGRETOL XR) 100 MG 12 hr tablet Take 300 mg by mouth at bedtime.     . cetirizine (ZYRTEC) 10 MG tablet Take 10 mg by mouth daily.    [name] docusate sodium (COLACE) 100 MG capsule Take 200 mg by mouth daily.    [name] lamoTRIgine (LAMICTAL) 200 MG tablet Take 100 mg by mouth  daily.    . meloxicam (MOBIC) 15 MG tablet     . montelukast (SINGULAIR) 10 MG tablet Take 10 mg by mouth at bedtime.    . pantoprazole (PROTONIX) 40 MG tablet Take 40 mg by mouth daily.     No current facility-administered medications on file prior to visit.    Social History   Socioeconomic History  . Marital status: Legally Separated    Spouse name: Not on file  . Number of children: 1  . Years of education: HS  . Highest education level: Not on file  Occupational History  . Occupation: disabled    Fish farm manager: UNEMPLOYED  Tobacco Use  . Smoking status: Former Smoker    Packs/day: 0.50    Years: 8.00    Pack years: 4.00    Types: Cigarettes    Quit date: 06/09/1978    Years since quitting: 42.0  . Smokeless tobacco: Never Used  Vaping Use  . Vaping Use: Never used   Substance and Sexual Activity  . Alcohol use: No  . Drug use: No  . Sexual activity: Not on file  Other Topics Concern  . Not on file  Social History Narrative   Patient is right handed.   Patient does not drink caffeine.   Social Determinants of Health   Financial Resource Strain: Not on file  Food Insecurity: Not on file  Transportation Needs: Not on file  Physical Activity: Not on file  Stress: Not on file  Social Connections: Not on file  Intimate Partner Violence: Not on file    Family History  Problem Relation Age of Onset  . Allergies Mother   . Clotting disorder Mother   . Diabetes Mother   . Allergies Father   . Heart disease Father   . Heart attack Father   . Diabetes Father   . Allergies Brother   . Chorea Maternal Grandmother     BP (!) 145/85   Pulse 90   Ht 5\' 5"  (1.651 m)   Wt 165 lb (74.8 kg)   BMI 27.46 kg/m   Body mass index is 27.46 kg/m.   All other systems reviewed and are negative.  The following is a summary of the past history medically, past history surgically, known current medicines, social history and family history.  This information is gathered electronically by the computer from prior information and documentation.  I review this each visit and have found including this information at this point in the chart is beneficial and informative.    Past Medical History:  Diagnosis Date  . Allergic rhinitis   . Asthma   . Benign familial tremor   . Bipolar 1 disorder ([place])   . Chronic neck and back pain   . Degenerative disk disease   . Dyslipidemia   . Fibromyalgia   . GERD (gastroesophageal reflux disease)   . Hiatal hernia   . Irritable bowel syndrome (IBS)   . Memory disorder 07/18/2014  . Memory loss   . Migraine headache   . Nephrolithiasis   . Pneumonia 1986  . Serotonin syndrome     Past Surgical History:  Procedure Laterality Date  . ANKLE FRACTURE SURGERY  2006   Right  . APPENDECTOMY    . back fusion    .  BACK SURGERY    . BRONCHOSCOPY     S. aureus from BAL  . CARDIAC CATHETERIZATION  November 27, 2006   minimal CAD  . ESOPHAGEAL DILATION  August 06, 2004  .  LAPAROSCOPIC CHOLECYSTECTOMY    . LASER ABLATION OF THE CERVIX    . MICRODISSECTION L5-S1  August 13, 2000  . TONSILLECTOMY      Family History  Problem Relation Age of Onset  . Allergies Mother   . Clotting disorder Mother   . Diabetes Mother   . Allergies Father   . Heart disease Father   . Heart attack Father   . Diabetes Father   . Allergies Brother   . Chorea Maternal Grandmother     Social History Social History   Tobacco Use  . Smoking status: Former Smoker    Packs/day: 0.50    Years: 8.00    Pack years: 4.00    Types: Cigarettes    Quit date: 06/09/1978    Years since quitting: 42.0  . Smokeless tobacco: Never Used  Vaping Use  . Vaping Use: Never used  Substance Use Topics  . Alcohol use: No  . Drug use: No    Allergies  Allergen Reactions  . Cephalexin   . Cephalosporins   . Cymbalta [Duloxetine Hcl]   . Divalproex Sodium   . Effexor [Venlafaxine Hydrochloride]   . Geodon [Ziprasidone Hydrochloride]   . Hydrocodone     Pt states she is able to take in liquid/hydromet form  . Prozac [Fluoxetine Hcl]   . Theo-Dur [Theophylline]   . Thorazine [Chlorpromazine Hcl]   . Topamax [Topiramate] Other (See Comments)  . Codeine Nausea Only    Pt states must take phenergan with codeine.      . Morphine Nausea And Vomiting and Nausea Only       . Ziprasidone Nausea Only and Other (See Comments)    Hands and feet tingling.    Current Outpatient Medications  Medication Sig Dispense Refill  . albuterol (PROVENTIL HFA;VENTOLIN HFA) 108 (90 BASE) MCG/ACT inhaler Inhale 2 puffs into the lungs every 6 (six) hours as needed. 1 Inhaler 5  . albuterol (PROVENTIL) (2.5 MG/3ML) 0.083% nebulizer solution Take 2.5 mg by nebulization every 6 (six) hours as needed.    [name] atorvastatin (LIPITOR) 10 MG tablet Take 10  mg by mouth daily.    . Benralizumab (FASENRA PEN) 30 MG/ML SOAJ Inject 1 mL into the skin every 8 (eight) weeks. 1 mL 5  . budesonide-formoterol (SYMBICORT) 160-4.5 MCG/ACT inhaler TAKE 2 PUFFS BY MOUTH TWICE A DAY 30.6 Inhaler 3  . buPROPion (WELLBUTRIN) 100 MG tablet Take 100 mg by mouth daily.    . carbamazepine (TEGRETOL XR) 100 MG 12 hr tablet Take 300 mg by mouth at bedtime.     . cetirizine (ZYRTEC) 10 MG tablet Take 10 mg by mouth daily.    [name] docusate sodium (COLACE) 100 MG capsule Take 200 mg by mouth daily.    [name] lamoTRIgine (LAMICTAL) 200 MG tablet Take 100 mg by mouth daily.    . meloxicam (MOBIC) 15 MG tablet     . montelukast (SINGULAIR) 10 MG tablet Take 10 mg by mouth at bedtime.    . pantoprazole (PROTONIX) 40 MG tablet Take 40 mg by mouth daily.     No current facility-administered medications for this visit.     Physical Exam  Blood pressure (!) 145/85, pulse 90, height 5\' 5"  (1.651 m), weight 165 lb (74.8 kg).  Constitutional: overall normal hygiene, normal nutrition, well developed, normal grooming, normal body habitus. Assistive device:none  Musculoskeletal: gait and station Limp right, muscle tone and strength are normal, no tremors or atrophy is present.  [name]  Neurological: coordination overall normal.  Deep tendon reflex/nerve stretch intact.  Sensation normal.  Cranial nerves II-XII intact.   Skin:   Normal overall no scars, lesions, ulcers or rashes. No psoriasis.  Psychiatric: Alert and oriented x 3.  Recent memory intact, remote memory unclear.  Normal mood and affect. Well groomed.  Good eye contact.  Cardiovascular: overall no swelling, no varicosities, no edema bilaterally, normal temperatures of the legs and arms, no clubbing, cyanosis and good capillary refill.  Lymphatic: palpation is normal.  Right knee with tenderness medially, slight crepitus, no effusion, positive medial [place], limp right, NV intact.  All other systems reviewed and are  negative   The patient has been educated about the nature of the problem(s) and counseled on treatment options.  The patient appeared to understand what I have discussed and is in agreement with it.  Encounter Diagnosis  Name Primary?  . Chronic pain of right knee Yes   X-rays were done of the right knee, reported separately.  PLAN Call if any problems.  Precautions discussed.  Continue current medications.   Return to clinic 2 weeks   Get MRI of the right knee.  Electronically Signed Sanjuana Kava, MD 1/11/20229:17 AM

## 2020-06-28 ENCOUNTER — Ambulatory Visit (HOSPITAL_COMMUNITY)
Admission: RE | Admit: 2020-06-28 | Discharge: 2020-06-28 | Disposition: A | Discharge status: Home / Self Care | Payer: PPO | Source: Ambulatory Visit | Attending: Orthopaedic Surgery | Admitting: Orthopaedic Surgery

## 2020-06-28 ENCOUNTER — Other Ambulatory Visit: Payer: Self-pay

## 2020-06-28 DIAGNOSIS — M25561 Pain in right knee: Secondary | ICD-10-CM | POA: Insufficient documentation

## 2020-06-28 DIAGNOSIS — G8929 Other chronic pain: Secondary | ICD-10-CM | POA: Insufficient documentation

## 2020-07-10 ENCOUNTER — Other Ambulatory Visit: Payer: Self-pay

## 2020-07-10 ENCOUNTER — Encounter: Payer: Self-pay | Admitting: Orthopaedic Surgery

## 2020-07-10 ENCOUNTER — Ambulatory Visit: Payer: PPO | Admitting: Orthopaedic Surgery

## 2020-07-10 VITALS — BP 134/82 | HR 80 | Ht 65.0 in | Wt 165.0 lb

## 2020-07-10 DIAGNOSIS — M25561 Pain in right knee: Secondary | ICD-10-CM | POA: Diagnosis not present

## 2020-07-10 DIAGNOSIS — G8929 Other chronic pain: Secondary | ICD-10-CM

## 2020-07-10 NOTE — Progress Notes (Signed)
Patient Nancy [name], female DOB:1966-04-10, 55 y.o. [id number]  Chief Complaint  Patient presents with  . Knee Pain    Right/ review MRI today     HPI  Nancy [name] is a 55 y.o. female who has pain in the right knee.  MRI was done and showed: IMPRESSION: 1. Focal full-thickness cartilage defect of the central to anterior portions of the weight-bearing medial femoral condyle measuring 2.5 x 10 mm. 2. Tendinosis with interstitial tearing and partial avulsion involving the medial gastrocnemius tendon origin with surrounding peritendinous edema. 3. Intact menisci. Intact cruciate and collateral ligaments.  I have explained the findings to her.  This is unusual problem.  Her menisci are intact.  I have told her about arthritis and possible surgery.  I will have Dr. Aline Brochure see her.  She is agreeable to this.   Body mass index is 27.46 kg/m.  ROS  Review of Systems  Constitutional: Positive for activity change.  Respiratory: Positive for shortness of breath.   Musculoskeletal: Positive for gait problem and joint swelling.  All other systems reviewed and are negative.   All other systems reviewed and are negative.  The following is a summary of the past history medically, past history surgically, known current medicines, social history and family history.  This information is gathered electronically by the computer from prior information and documentation.  I review this each visit and have found including this information at this point in the chart is beneficial and informative.    Past Medical History:  Diagnosis Date  . Allergic rhinitis   . Asthma   . Benign familial tremor   . Bipolar 1 disorder ([place])   . Chronic neck and back pain   . Degenerative disk disease   . Dyslipidemia   . Fibromyalgia   . GERD (gastroesophageal reflux disease)   . Hiatal hernia   . Irritable bowel syndrome (IBS)   . Memory disorder 07/18/2014  . Memory loss   . Migraine  headache   . Nephrolithiasis   . Pneumonia 1986  . Serotonin syndrome     Past Surgical History:  Procedure Laterality Date  . ANKLE FRACTURE SURGERY  2006   Right  . APPENDECTOMY    . back fusion    . BACK SURGERY    . BRONCHOSCOPY     S. aureus from BAL  . CARDIAC CATHETERIZATION  November 27, 2006   minimal CAD  . ESOPHAGEAL DILATION  August 06, 2004  . LAPAROSCOPIC CHOLECYSTECTOMY    . LASER ABLATION OF THE CERVIX    . MICRODISSECTION L5-S1  August 13, 2000  . TONSILLECTOMY      Family History  Problem Relation Age of Onset  . Allergies Mother   . Clotting disorder Mother   . Diabetes Mother   . Allergies Father   . Heart disease Father   . Heart attack Father   . Diabetes Father   . Allergies Brother   . Chorea Maternal Grandmother     Social History Social History   Tobacco Use  . Smoking status: Former Smoker    Packs/day: 0.50    Years: 8.00    Pack years: 4.00    Types: Cigarettes    Quit date: 06/09/1978    Years since quitting: 42.1  . Smokeless tobacco: Never Used  Vaping Use  . Vaping Use: Never used  Substance Use Topics  . Alcohol use: No  . Drug use: No    Allergies  Allergen  Reactions  . Cephalexin   . Cephalosporins   . Cymbalta [Duloxetine Hcl]   . Divalproex Sodium   . Effexor [Venlafaxine Hydrochloride]   . Geodon [Ziprasidone Hydrochloride]   . Hydrocodone     Pt states she is able to take in liquid/hydromet form  . Prozac [Fluoxetine Hcl]   . Theo-Dur [Theophylline]   . Thorazine [Chlorpromazine Hcl]   . Topamax [Topiramate] Other (See Comments)  . Codeine Nausea Only    Pt states must take phenergan with codeine.      . Morphine Nausea And Vomiting and Nausea Only       . Ziprasidone Nausea Only and Other (See Comments)    Hands and feet tingling.    Current Outpatient Medications  Medication Sig Dispense Refill  . albuterol (PROVENTIL HFA;VENTOLIN HFA) 108 (90 BASE) MCG/ACT inhaler Inhale 2 puffs into the lungs  every 6 (six) hours as needed. 1 Inhaler 5  . albuterol (PROVENTIL) (2.5 MG/3ML) 0.083% nebulizer solution Take 2.5 mg by nebulization every 6 (six) hours as needed.    [name] atorvastatin (LIPITOR) 10 MG tablet Take 10 mg by mouth daily.    . Benralizumab (FASENRA PEN) 30 MG/ML SOAJ Inject 1 mL into the skin every 8 (eight) weeks. 1 mL 5  . budesonide-formoterol (SYMBICORT) 160-4.5 MCG/ACT inhaler TAKE 2 PUFFS BY MOUTH TWICE A DAY 30.6 Inhaler 3  . buPROPion (WELLBUTRIN) 100 MG tablet Take 100 mg by mouth daily.    . carbamazepine (TEGRETOL XR) 100 MG 12 hr tablet Take 300 mg by mouth at bedtime.     . cetirizine (ZYRTEC) 10 MG tablet Take 10 mg by mouth daily.    [name] docusate sodium (COLACE) 100 MG capsule Take 200 mg by mouth daily.    [name] lamoTRIgine (LAMICTAL) 200 MG tablet Take 100 mg by mouth daily.    . meloxicam (MOBIC) 15 MG tablet     . montelukast (SINGULAIR) 10 MG tablet Take 10 mg by mouth at bedtime.    . pantoprazole (PROTONIX) 40 MG tablet Take 40 mg by mouth daily.     No current facility-administered medications for this visit.     Physical Exam  Blood pressure 134/82, pulse 80, height 5\' 5"  (1.651 m), weight 165 lb (74.8 kg).  Constitutional: overall normal hygiene, normal nutrition, well developed, normal grooming, normal body habitus. Assistive device:none  Musculoskeletal: gait and station Limp right, muscle tone and strength are normal, no tremors or atrophy is present.  .  Neurological: coordination overall normal.  Deep tendon reflex/nerve stretch intact.  Sensation normal.  Cranial nerves II-XII intact.   Skin:   Normal overall no scars, lesions, ulcers or rashes. No psoriasis.  Psychiatric: Alert and oriented x 3.  Recent memory intact, remote memory unclear.  Normal mood and affect. Well groomed.  Good eye contact.  Cardiovascular: overall no swelling, no varicosities, no edema bilaterally, normal temperatures of the legs and arms, no clubbing, cyanosis and good  capillary refill.  Lymphatic: palpation is normal.  Right knee with medial pain, ROM 0 to 115, crepitus, slight effusion, limp right.  All other systems reviewed and are negative   The patient has been educated about the nature of the problem(s) and counseled on treatment options.  The patient appeared to understand what I have discussed and is in agreement with it.  Encounter Diagnosis  Name Primary?  . Chronic pain of right knee Yes    PLAN Call if any problems.  Precautions discussed.  Continue current  medications.   Return to clinic to see DR. [facility]   Electronically [place], MD 2/1/20228:18 AM

## 2020-07-18 ENCOUNTER — Other Ambulatory Visit: Payer: Self-pay

## 2020-07-18 ENCOUNTER — Encounter: Payer: Self-pay | Admitting: Orthopedic Surgery

## 2020-07-18 ENCOUNTER — Ambulatory Visit: Payer: PPO | Admitting: Orthopedic Surgery

## 2020-07-18 VITALS — BP 137/83 | HR 85 | Ht 65.0 in | Wt 167.0 lb

## 2020-07-18 DIAGNOSIS — M1711 Unilateral primary osteoarthritis, right knee: Secondary | ICD-10-CM | POA: Diagnosis not present

## 2020-07-18 DIAGNOSIS — M1710 Unilateral primary osteoarthritis, unspecified knee: Secondary | ICD-10-CM

## 2020-07-18 DIAGNOSIS — G8929 Other chronic pain: Secondary | ICD-10-CM

## 2020-07-18 NOTE — Patient Instructions (Signed)
Chronic Knee Pain, Adult Chronic knee pain is pain in one or both knees that lasts longer than 3 months. Symptoms of chronic knee pain may include swelling, stiffness, and discomfort. Age-related wear and tear (osteoarthritis) of the knee joint is the most common cause of chronic knee pain. Other possible causes include:  A long-term immune-related disease that causes inflammation of the knee (rheumatoid arthritis). This usually affects both knees.  Inflammatory arthritis, such as gout or pseudogout.  An injury to the knee that causes arthritis.  An injury to the knee that damages the ligaments. Ligaments are strong tissues that connect bones to each other.  Runner's knee or pain behind the kneecap. Treatment for chronic knee pain depends on the cause. The main treatments for chronic knee pain are physical therapy and weight loss. This condition may also be treated with medicines, injections, a knee sleeve or brace, and by using crutches. Rest, ice, pressure (compression), and elevation, also known as RICE therapy, may also be recommended. Follow these instructions at home: If you have a knee sleeve or brace:  Wear the knee sleeve or brace as told by your health care provider. Remove it only as told by your health care provider.  Loosen it if your toes tingle, become numb, or turn cold and blue.  Keep it clean.  If the sleeve or brace is not waterproof: ? Do not let it get wet. ? Remove it if allowed by your health care provider, or cover it with a watertight covering when you take a bath or a shower.   Managing pain, stiffness, and swelling  If directed, apply heat to the affected area as often as told by your health care provider. Use the heat source that your health care provider recommends, such as a moist heat pack or a heating pad. ? If you have a removable knee sleeve or brace, remove it as told by your health care provider. ? Place a towel between your skin and the heat  source. ? Leave the heat on for 20-30 minutes. ? Remove the heat if your skin turns bright red. This is especially important if you are unable to feel pain, heat, or cold. You may have a greater risk of getting burned.  If directed, put ice on the affected area. To do this: ? If you have a removable knee sleeve or brace, remove it as told by your health care provider. ? Put ice in a plastic bag. ? Place a towel between your skin and the bag. ? Leave the ice on for 20 minutes, 2-3 times a day. ? Remove the ice if your skin turns bright red. This is very important. If you cannot feel pain, heat, or cold, you have a greater risk of damage to the area.  Move your toes often to reduce stiffness and swelling.  Raise (elevate) the injured area above the level of your heart while you are sitting or lying down.      Activity  Avoid high-impact activities or exercises, such as running, jumping rope, or doing jumping jacks.  Follow the exercise plan that your health care provider designed for you. Your health care provider may suggest that you: ? Avoid activities that make knee pain worse. This may require you to change your exercise routines, sport participation, or job duties. ? Wear shoes with cushioned soles. ? Avoid sports that require running and sudden changes in direction. ? Do physical therapy. Physical therapy is planned to match your needs and abilities.   It may include exercises for strength, flexibility, stability, and endurance. ? Do exercises that increase balance and strength, such as tai chi and yoga.  Do not use the injured limb to support your body weight until your health care provider says that you can. Use crutches as told by your health care provider.  Return to your normal activities as told by your health care provider. Ask your health care provider what activities are safe for you. General instructions  Take over-the-counter and prescription medicines only as told by  your health care provider.  Lose weight if you are overweight. Losing even a little weight can reduce knee pain. Ask your health care provider what your ideal weight is, and how to safely lose extra weight. A dietitian may be able to help you plan your meals.  Do not use any products that contain nicotine or tobacco, such as cigarettes, e-cigarettes, and chewing tobacco. These can delay healing. If you need help quitting, ask your health care provider.  Keep all follow-up visits. This is important. Contact a health care provider if:  You have knee pain that is not getting better or gets worse.  You are unable to do your physical therapy exercises due to knee pain. Get help right away if:  Your knee swells and the swelling becomes worse.  You cannot move your knee.  You have severe knee pain. Summary  Knee pain that lasts more than 3 months is considered chronic knee pain.  The main treatments for chronic knee pain are physical therapy and weight loss. You may also need to take medicines, wear a knee sleeve or brace, use crutches, and apply ice or heat.  Losing even a little weight can reduce knee pain. Ask your health care provider what your ideal weight is, and how to safely lose extra weight. A dietitian may be able to help you plan your meals.  Follow the exercise plan that your health care provider designed for you. This information is not intended to replace advice given to you by your health care provider. Make sure you discuss any questions you have with your health care provider. Document Revised: 11/09/2019 Document Reviewed: 11/09/2019 Elsevier Patient Education  2021 Elsevier Inc.  

## 2020-07-18 NOTE — Progress Notes (Signed)
Chief Complaint  Patient presents with  . Knee Pain    Right knee surgical consult     Assessment and plan  55 year old female with osteoarthritis of the right knee mechanical symptoms but no evidence of intra-articular ligamentous injury or meniscal tear  She does have a 1 cm osteochondral defect in the medial femoral condyle with stress reaction in the marrow of the bone just beneath it  No surgical indications at this time  I will start her off with injection and physical therapy to address the weakness in the right leg.  If she does not improve with that we will start hyaluronic acid injections  55 year old female sent for possible surgical consult after MRI showed a chondral lesion and bone edema in the medial compartment with osteoarthritis without meniscal or ligamentous tear  Patient complains  of catching giving way and pain in the back of the knee  MRI does show some posterior gastroc tearing  X-ray alignment is normal     After reviewing the MRI and images and talking with the patient it seems that her pain is from arthritis.  There is no surgical lesion to address at this time  Recommend cortisone injection physical therapy and then if that does not provide any relief then hyaluronic acid injections would be  Right knee injection  Physical therapy  Follow-up 6 weeks  Procedure note right knee injection   verbal consent was obtained to inject right knee joint  Timeout was completed to confirm the site of injection  The medications used were 40 mg of Depo-Medrol and 1% lidocaine 3 cc  Anesthesia was provided by ethyl chloride and the skin was prepped with alcohol.  After cleaning the skin with alcohol a 20-gauge needle was used to inject the right knee joint. There were no complications. A sterile bandage was applied.   Encounter Diagnoses  Name Primary?  . Chronic pain of right knee Yes  . Primary localized osteoarthritis of knee

## 2020-07-20 DIAGNOSIS — Z299 Encounter for prophylactic measures, unspecified: Secondary | ICD-10-CM | POA: Diagnosis not present

## 2020-07-20 DIAGNOSIS — J029 Acute pharyngitis, unspecified: Secondary | ICD-10-CM | POA: Diagnosis not present

## 2020-07-20 DIAGNOSIS — K219 Gastro-esophageal reflux disease without esophagitis: Secondary | ICD-10-CM | POA: Diagnosis not present

## 2020-07-20 DIAGNOSIS — F319 Bipolar disorder, unspecified: Secondary | ICD-10-CM | POA: Diagnosis not present

## 2020-07-20 DIAGNOSIS — F32A Depression, unspecified: Secondary | ICD-10-CM | POA: Diagnosis not present

## 2020-07-23 DIAGNOSIS — F32A Depression, unspecified: Secondary | ICD-10-CM | POA: Diagnosis not present

## 2020-07-23 DIAGNOSIS — G4700 Insomnia, unspecified: Secondary | ICD-10-CM | POA: Diagnosis not present

## 2020-07-23 DIAGNOSIS — J40 Bronchitis, not specified as acute or chronic: Secondary | ICD-10-CM | POA: Diagnosis not present

## 2020-07-23 DIAGNOSIS — F319 Bipolar disorder, unspecified: Secondary | ICD-10-CM | POA: Diagnosis not present

## 2020-07-23 DIAGNOSIS — Z299 Encounter for prophylactic measures, unspecified: Secondary | ICD-10-CM | POA: Diagnosis not present

## 2020-08-21 ENCOUNTER — Other Ambulatory Visit: Payer: Self-pay

## 2020-08-21 ENCOUNTER — Encounter (HOSPITAL_COMMUNITY): Payer: Self-pay

## 2020-08-21 ENCOUNTER — Ambulatory Visit (HOSPITAL_COMMUNITY): Payer: PPO | Attending: Orthopedic Surgery

## 2020-08-21 DIAGNOSIS — G8929 Other chronic pain: Secondary | ICD-10-CM

## 2020-08-21 DIAGNOSIS — M25561 Pain in right knee: Secondary | ICD-10-CM | POA: Diagnosis not present

## 2020-08-21 DIAGNOSIS — R262 Difficulty in walking, not elsewhere classified: Secondary | ICD-10-CM

## 2020-08-21 NOTE — Therapy (Signed)
[facility] [address] [place], [place], [address] Phone: [phone]   Fax:  [phone]  Physical Therapy Evaluation  Patient Details  Name: Nancy [name] MRN: [id number] Date of Birth: 05-03-1966 Referring Provider (PT): Arther Abbott   Encounter Date: 08/21/2020   PT End of Session - 08/21/20 0908    Visit Number 1    Number of Visits 8    Date for PT Re-Evaluation 09/18/20    Authorization Type [employer], no auth, no VL    Progress Note Due on Visit 8    PT Start Time 0900    PT Stop Time 0945    PT Time Calculation (min) 45 min    Activity Tolerance Patient limited by pain    Behavior During Therapy [place] for tasks assessed/performed           Past Medical History:  Diagnosis Date  . Allergic rhinitis   . Asthma   . Benign familial tremor   . Bipolar 1 disorder ([place])   . Chronic neck and back pain   . Degenerative disk disease   . Dyslipidemia   . Fibromyalgia   . GERD (gastroesophageal reflux disease)   . Hiatal hernia   . Irritable bowel syndrome (IBS)   . Memory disorder 07/18/2014  . Memory loss   . Migraine headache   . Nephrolithiasis   . Pneumonia 1986  . Serotonin syndrome     Past Surgical History:  Procedure Laterality Date  . ANKLE FRACTURE SURGERY  2006   Right  . APPENDECTOMY    . back fusion    . BACK SURGERY    . BRONCHOSCOPY     S. aureus from BAL  . CARDIAC CATHETERIZATION  November 27, 2006   minimal CAD  . ESOPHAGEAL DILATION  August 06, 2004  . LAPAROSCOPIC CHOLECYSTECTOMY    . LASER ABLATION OF THE CERVIX    . MICRODISSECTION L5-S1  August 13, 2000  . TONSILLECTOMY      There were no vitals filed for this visit.    Subjective Assessment - 08/21/20 0908    Subjective Right knee pain x several months insidious onset with medial knee being main area. Notes popping in knee with pain and sometimes locking experienced. Notes increase in pain with walking and sitting  cross-legged. Recently received injection to right knee    Diagnostic tests MRI reveals osteochondral and some gastroc tearing    Currently in Pain? Yes    Pain Score 5     Pain Location Knee    Pain Orientation Right    Pain Descriptors / Indicators Aching;Sore    Pain Type Chronic pain    Pain Onset More than a month ago    Pain Frequency Intermittent    Aggravating Factors  sitting cross-legged on the floor    Pain Relieving Factors nothing of note other than Mobic              [facility] PT Assessment - 08/21/20 0001      Assessment   Medical Diagnosis chronic right knee pain: M25.561,    Referring Provider (PT) Arther Abbott    Next MD Visit 08/29/20    Prior Therapy no      Balance Screen   Has the patient fallen in the past 6 months No    Has the patient had a decrease in activity level because of a fear of falling?  No    Is the patient reluctant  to leave their home because of a fear of falling?  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      Observation/Other Assessments   Focus on Therapeutic Outcomes (FOTO)  58% function      ROM / Strength   AROM / PROM / Strength AROM;Strength      AROM   Overall AROM  Deficits    Overall AROM Comments poor squat mechanics, can only initiate and maintain top 10% of motion, compensation noted beyond      Strength   Strength Assessment Site Knee;Ankle    Right/Left Knee Right    Right Knee Flexion 3+/5    Right Knee Extension 3+/5    Right/Left Ankle Right    Right Ankle Dorsiflexion 4/5    Right Ankle Plantar Flexion 3/5      Ambulation/Gait    Ambulation/Gait Yes    Ambulation/Gait Assistance 7: Independent    Ambulation Distance (Feet) 300 Feet    Assistive device None    Gait Pattern Antalgic;Lateral trunk lean to right    Ambulation Surface Level    Gait velocity decreased    Gait Comments 2MWT                      Objective measurements completed on examination: See above findings.       [facility] Adult PT Treatment/Exercise - 08/21/20 0001      Exercises   Exercises Knee/Hip      Knee/Hip Exercises: Stretches   Gastroc Stretch Right;2 reps;30 seconds    Gastroc Stretch Limitations towel      Knee/Hip Exercises: Supine   Quad Sets Strengthening;Right;1 set;10 reps    Other Supine Knee/Hip Exercises ball squeeze 1x10 3 sec hold                  PT Education - 08/21/20 1000    Education Details pt education on benefits of use of cane for ambulation and HEP development    Person(s) Educated Patient    Methods Explanation;Demonstration    Comprehension Verbalized understanding            PT Short Term Goals - 08/21/20 1005      PT SHORT TERM GOAL #1   Title Patient will demo independnce with HEP for LE strengthening to improve function    Time 2    Period Weeks    Status New    Target Date 09/04/20      PT SHORT TERM GOAL #2   Title Patient will report pain of 3/10 with ambulation to increase activity tolerance    Baseline 5/10 pain Right knee    Time 2    Period Weeks    Status New    Target Date 09/04/20      PT SHORT TERM GOAL #3   Title Patient will demo partial range symmetrical squat with good body mechanics to decrease pain with functional mobility    Baseline Poor body mechanics and coordination    Time 2  Period Weeks    Status New    Target Date 09/04/20             PT Long Term Goals - 08/21/20 1008      PT LONG TERM GOAL #1   Title Patient will improve on FOTO score to meet predicted outcomes to improve functional independence    Baseline 58% function     Time 4    Period Weeks    Status New    Target Date 09/18/20      PT LONG TERM GOAL #2   Title Patient will demo 400 ft during 2MWT with normalized gait pattern    Baseline 300 ft with significant right lateral lean and antalgia    Time 4    Period Weeks    Status New    Target Date 09/18/20                  Plan - 08/21/20 1002    Clinical Impression Statement Patient is a  55 yo lady presenting to physical therapy with c/o Right knee pain. She presents with pain limited deficits in RLE strength, ROM, endurance, postural impairments, and functional mobility with ADL. She is having to modify and restrict ADL as indicated by FOTO score as well as subjective information and objective measures which is affecting overall participation. Patient will benefit from skilled physical therapy in order to improve function and reduce impairment    Personal Factors and Comorbidities Comorbidity 1;Time since onset of injury/illness/exacerbation    Comorbidities PMH    Examination-Activity Limitations Bend;Caring for Others;Carry;Lift;Stand;Stairs;Squat;Locomotion Level;Transfers    Examination-Participation Restrictions Cleaning;Community Activity;Occupation;Yard Work    Stability/Clinical Decision Making Stable/Uncomplicated    Designer, jewellery Low    Rehab Potential Good    PT Frequency 2x / week    PT Duration 4 weeks    PT Treatment/Interventions ADLs/Self Care Home Management;Aquatic Therapy;Cryotherapy;Electrical Stimulation;DME Instruction;Ultrasound;Traction;Gait training;Stair training;Functional mobility training;Therapeutic activities;Therapeutic exercise;Balance training;Patient/family education;Orthotic Fit/Training;Neuromuscular re-education;Manual techniques;Taping;Splinting;Energy conservation;Dry needling;Joint Manipulations    PT Next Visit Plan Gait train with cane (she doesn't want to use one), Continue with LE strengthening    PT Home Exercise Plan QS, ball  squeeze, calf stretch    Consulted and Agree with Plan of Care Patient           Patient will benefit from skilled therapeutic intervention in order to improve the following deficits and impairments:  Abnormal gait,Decreased activity tolerance,Decreased mobility,Decreased knowledge of use of DME,Decreased endurance,Decreased strength,Difficulty walking,Impaired perceived functional ability,Improper body mechanics,Pain  Visit Diagnosis: Difficulty in walking, not elsewhere classified  Chronic pain of right knee     Problem List Patient Active Problem List   Diagnosis Date Noted  . Allergic rhinitis 12/30/2019  . Preoperative clearance 11/09/2018  . Memory disorder 07/18/2014  . Chest pain of uncertain etiology 11/94/1740  . Exercise-induced asthma 07/30/2012  . Severe persistent asthma in adult without complication 81/44/8185   10:11 AM, 08/21/20 M. Sherlyn Lees, PT, DPT Physical Therapist- [facility] Office Number: [phone]  [place], [place], [address] Phone: [phone]   Fax:  [phone]  Name: Nancy [name] MRN: [id number] Date of Birth: [birth year]

## 2020-08-27 DIAGNOSIS — Z299 Encounter for prophylactic measures, unspecified: Secondary | ICD-10-CM | POA: Diagnosis not present

## 2020-08-27 DIAGNOSIS — F319 Bipolar disorder, unspecified: Secondary | ICD-10-CM | POA: Diagnosis not present

## 2020-08-27 DIAGNOSIS — R5383 Other fatigue: Secondary | ICD-10-CM | POA: Diagnosis not present

## 2020-08-27 DIAGNOSIS — R110 Nausea: Secondary | ICD-10-CM | POA: Diagnosis not present

## 2020-08-29 ENCOUNTER — Encounter: Payer: Self-pay | Admitting: Orthopedic Surgery

## 2020-08-29 ENCOUNTER — Other Ambulatory Visit: Payer: Self-pay

## 2020-08-29 ENCOUNTER — Ambulatory Visit: Payer: PPO | Admitting: Orthopedic Surgery

## 2020-08-29 VITALS — BP 122/83 | HR 68 | Ht 65.0 in | Wt 167.0 lb

## 2020-08-29 DIAGNOSIS — G8929 Other chronic pain: Secondary | ICD-10-CM

## 2020-08-29 DIAGNOSIS — M1711 Unilateral primary osteoarthritis, right knee: Secondary | ICD-10-CM | POA: Diagnosis not present

## 2020-08-29 DIAGNOSIS — M1710 Unilateral primary osteoarthritis, unspecified knee: Secondary | ICD-10-CM

## 2020-08-29 NOTE — Progress Notes (Signed)
Chief Complaint  Patient presents with  . Knee Pain    Right still has pain     Follow up patient has a chondral lesion on the medial side of her knee does not look to be operative  She did not significantly improve after her last intervention so we are starting her on Orthovisc.  Orthovisc injection right knee 1 of 3  Follow-up in a week for second injection  Procedure note injection right knee with Orthovisc  Timeout was completed patient gave verbal consent use a lateral approach we used ethyl chloride and alcohol to prep for the injection we injected without any difficulty follow-up in a week  Encounter Diagnoses  Name Primary?  . Primary localized osteoarthritis of knee Yes  . Chronic pain of right knee

## 2020-09-05 ENCOUNTER — Encounter: Payer: Self-pay | Admitting: Orthopedic Surgery

## 2020-09-05 ENCOUNTER — Other Ambulatory Visit: Payer: Self-pay

## 2020-09-05 ENCOUNTER — Ambulatory Visit (INDEPENDENT_AMBULATORY_CARE_PROVIDER_SITE_OTHER): Payer: PPO | Admitting: Orthopedic Surgery

## 2020-09-05 DIAGNOSIS — M1711 Unilateral primary osteoarthritis, right knee: Secondary | ICD-10-CM | POA: Diagnosis not present

## 2020-09-05 DIAGNOSIS — M1710 Unilateral primary osteoarthritis, unspecified knee: Secondary | ICD-10-CM

## 2020-09-05 DIAGNOSIS — G8929 Other chronic pain: Secondary | ICD-10-CM

## 2020-09-05 NOTE — Progress Notes (Signed)
Chief Complaint  Patient presents with  . Injections    Right knee Orthovisc 2 of 3    Procedure note for injection of hyaluronic acid   Diagnosis osteoarthritis of the knee  Verbal consent was obtained to inject the knee with HYALURONIC ACID . Timeout was completed to confirm the injection site as the right   knee  Ethyl chloride spray was used for anesthesia Alcohol was used to prep the skin. The infrapatellar lateral portal was used as an injection site and 1 vial of hyaluronic acid  was injected into the knee  Specific Co. Preparation: orthovisc   No complications were noted  1 week fu

## 2020-09-07 DIAGNOSIS — F319 Bipolar disorder, unspecified: Secondary | ICD-10-CM | POA: Diagnosis not present

## 2020-09-07 DIAGNOSIS — Z299 Encounter for prophylactic measures, unspecified: Secondary | ICD-10-CM | POA: Diagnosis not present

## 2020-09-07 DIAGNOSIS — J209 Acute bronchitis, unspecified: Secondary | ICD-10-CM | POA: Diagnosis not present

## 2020-09-12 ENCOUNTER — Other Ambulatory Visit: Payer: Self-pay

## 2020-09-12 ENCOUNTER — Encounter: Payer: Self-pay | Admitting: Orthopedic Surgery

## 2020-09-12 ENCOUNTER — Ambulatory Visit (INDEPENDENT_AMBULATORY_CARE_PROVIDER_SITE_OTHER): Payer: PPO | Admitting: Orthopedic Surgery

## 2020-09-12 DIAGNOSIS — G8929 Other chronic pain: Secondary | ICD-10-CM

## 2020-09-12 DIAGNOSIS — M1710 Unilateral primary osteoarthritis, unspecified knee: Secondary | ICD-10-CM

## 2020-09-12 DIAGNOSIS — M1711 Unilateral primary osteoarthritis, right knee: Secondary | ICD-10-CM

## 2020-09-12 NOTE — Progress Notes (Signed)
Chief Complaint  Patient presents with  . Injections    3rd orthovisc lot [id number] ex 01/06/22    55 year old female chronic knee pain right knee with localized arthritis presents for third Orthovisc injection.   the right knee looks fine for injection  Proper site identification and delay to confirm site right knee injected with Orthovisc no complications views a lateral approach with ethyl chloride we used alcohol  Encounter Diagnoses  Name Primary?  . Primary localized osteoarthritis of knee Yes  . Chronic pain of right knee

## 2020-10-05 DIAGNOSIS — Z79899 Other long term (current) drug therapy: Secondary | ICD-10-CM | POA: Diagnosis not present

## 2020-10-05 DIAGNOSIS — E7800 Pure hypercholesterolemia, unspecified: Secondary | ICD-10-CM | POA: Diagnosis not present

## 2020-10-05 DIAGNOSIS — R5383 Other fatigue: Secondary | ICD-10-CM | POA: Diagnosis not present

## 2020-10-05 DIAGNOSIS — Z1339 Encounter for screening examination for other mental health and behavioral disorders: Secondary | ICD-10-CM | POA: Diagnosis not present

## 2020-10-05 DIAGNOSIS — Z87891 Personal history of nicotine dependence: Secondary | ICD-10-CM | POA: Diagnosis not present

## 2020-10-05 DIAGNOSIS — Z0000 Encounter for general adult medical examination without abnormal findings: Secondary | ICD-10-CM | POA: Diagnosis not present

## 2020-10-05 DIAGNOSIS — Z7189 Other specified counseling: Secondary | ICD-10-CM | POA: Diagnosis not present

## 2020-10-05 DIAGNOSIS — Z299 Encounter for prophylactic measures, unspecified: Secondary | ICD-10-CM | POA: Diagnosis not present

## 2020-10-05 DIAGNOSIS — Z6828 Body mass index (BMI) 28.0-28.9, adult: Secondary | ICD-10-CM | POA: Diagnosis not present

## 2020-10-05 DIAGNOSIS — Z1331 Encounter for screening for depression: Secondary | ICD-10-CM | POA: Diagnosis not present

## 2020-10-09 ENCOUNTER — Encounter (INDEPENDENT_AMBULATORY_CARE_PROVIDER_SITE_OTHER): Payer: Self-pay | Admitting: *Deleted

## 2020-10-24 ENCOUNTER — Ambulatory Visit: Payer: PPO | Admitting: Orthopedic Surgery

## 2020-10-29 ENCOUNTER — Other Ambulatory Visit (INDEPENDENT_AMBULATORY_CARE_PROVIDER_SITE_OTHER): Payer: Self-pay | Admitting: *Deleted

## 2020-10-31 ENCOUNTER — Other Ambulatory Visit: Payer: Self-pay

## 2020-10-31 ENCOUNTER — Ambulatory Visit (INDEPENDENT_AMBULATORY_CARE_PROVIDER_SITE_OTHER): Payer: PPO | Admitting: Orthopedic Surgery

## 2020-10-31 VITALS — Ht 65.0 in | Wt 165.0 lb

## 2020-10-31 DIAGNOSIS — G8929 Other chronic pain: Secondary | ICD-10-CM | POA: Diagnosis not present

## 2020-10-31 DIAGNOSIS — M958 Other specified acquired deformities of musculoskeletal system: Secondary | ICD-10-CM | POA: Diagnosis not present

## 2020-10-31 DIAGNOSIS — M25561 Pain in right knee: Secondary | ICD-10-CM

## 2020-10-31 DIAGNOSIS — M1710 Unilateral primary osteoarthritis, unspecified knee: Secondary | ICD-10-CM

## 2020-10-31 DIAGNOSIS — M1711 Unilateral primary osteoarthritis, right knee: Secondary | ICD-10-CM

## 2020-10-31 NOTE — Progress Notes (Signed)
Chief Complaint  Patient presents with  . Follow-up    Recheck on right knee, s/p orthovisc from 10-24-20.    Encounter Diagnoses  Name Primary?  . Primary localized osteoarthritis of knee Yes  . Chronic pain of right knee   . Osteochondral defect of condyle of femur      Nancy [name] comes back after Orthovisc injection she said it did help a lot  She was able to go to [place] and she walked 76miles.  She does complain of some medial tibial burning and pain  Examination shows that most of the tenderness is over the tibia the femoral condyle is only mildly tender where the lesion is on MRI which we reviewed today  She also has some pain behind the knee which is most likely related to her gastroc tear as seen on MRI  Recommend economy hinged brace for 6 weeks and then follow-up with me no surgery is needed at this time

## 2020-11-01 ENCOUNTER — Other Ambulatory Visit (INDEPENDENT_AMBULATORY_CARE_PROVIDER_SITE_OTHER): Payer: Self-pay

## 2020-11-01 ENCOUNTER — Encounter (HOSPITAL_COMMUNITY): Payer: Self-pay | Admitting: Emergency Medicine

## 2020-11-01 ENCOUNTER — Other Ambulatory Visit: Payer: Self-pay

## 2020-11-01 ENCOUNTER — Emergency Department (HOSPITAL_COMMUNITY)
Admission: EM | Admit: 2020-11-01 | Discharge: 2020-11-01 | Disposition: A | Discharge status: Home / Self Care | Payer: PPO | Attending: Emergency Medicine | Admitting: Emergency Medicine

## 2020-11-01 DIAGNOSIS — Z7951 Long term (current) use of inhaled steroids: Secondary | ICD-10-CM | POA: Diagnosis not present

## 2020-11-01 DIAGNOSIS — Z1211 Encounter for screening for malignant neoplasm of colon: Secondary | ICD-10-CM

## 2020-11-01 DIAGNOSIS — J4550 Severe persistent asthma, uncomplicated: Secondary | ICD-10-CM | POA: Diagnosis not present

## 2020-11-01 DIAGNOSIS — K645 Perianal venous thrombosis: Secondary | ICD-10-CM | POA: Diagnosis not present

## 2020-11-01 DIAGNOSIS — K644 Residual hemorrhoidal skin tags: Secondary | ICD-10-CM | POA: Diagnosis not present

## 2020-11-01 DIAGNOSIS — Z87891 Personal history of nicotine dependence: Secondary | ICD-10-CM | POA: Insufficient documentation

## 2020-11-01 DIAGNOSIS — F319 Bipolar disorder, unspecified: Secondary | ICD-10-CM | POA: Diagnosis not present

## 2020-11-01 DIAGNOSIS — K649 Unspecified hemorrhoids: Secondary | ICD-10-CM | POA: Diagnosis present

## 2020-11-01 DIAGNOSIS — Z299 Encounter for prophylactic measures, unspecified: Secondary | ICD-10-CM | POA: Diagnosis not present

## 2020-11-01 MED ORDER — HYDROCORTISONE ACETATE 25 MG RE SUPP: 25.0000 mg | Freq: Two times a day (BID) | RECTAL | 0 refills | Status: AC

## 2020-11-01 MED ORDER — HYDROCORTISONE (PERIANAL) 2.5 % EX CREA: 1.0000 "application " | TOPICAL_CREAM | Freq: Two times a day (BID) | CUTANEOUS | 0 refills | Status: DC

## 2020-11-01 NOTE — Discharge Instructions (Addendum)
Hot soaks in bathub with sitz baths Hydrocortisone cream twice daily Anusol suppository twice daily Keep stools soft and loose. ER for severe pain / severe bleeding  Follow up with Dr. Constance Haw as outlined above. - these may take some time to go away - but will eventually go away - if they are getting worse, they may need to do surgery or a procedure to remove them.

## 2020-11-01 NOTE — ED Provider Notes (Signed)
[facility] EMERGENCY DEPARTMENT Provider Note   CSN: [id number] Arrival date & time: 11/01/20  1453     History Chief Complaint  Patient presents with  . Hemorrhoids    Nancy [name] is a 55 y.o. female.  HPI   Pt is a 55 y/o female - hx of Bipolar, Asthma, and migraines, was sent to ED by PCP b/c of hemorrhoids, started having itching in the anal area about 2 days ago, yesterday she had increasing pain in that area and has used multiple topical medications as well as warm soaks and is on stool softeners because of her chronic IBS constipation.  She was seen by her primary care doctor today who told her to come to the emergency department because of a large engorged hemorrhoid which may be thrombosed.  She has not seen a surgeon nor has she seen gastroenterology for this problem, she notes that she has had hemorrhoids that she had her daughter 30 years ago but is never been bleeding or a problem.  She has not been straining, doing heavy lifting, she has not been constipated and constantly uses lots of stool softeners to help keep her stools soft  Past Medical History:  Diagnosis Date  . Allergic rhinitis   . Asthma   . Benign familial tremor   . Bipolar 1 disorder ([place])   . Chronic neck and back pain   . Degenerative disk disease   . Dyslipidemia   . Fibromyalgia   . GERD (gastroesophageal reflux disease)   . Hiatal hernia   . Irritable bowel syndrome (IBS)   . Memory disorder 07/18/2014  . Memory loss   . Migraine headache   . Nephrolithiasis   . Pneumonia 1986  . Serotonin syndrome     Patient Active Problem List   Diagnosis Date Noted  . Allergic rhinitis 12/30/2019  . Preoperative clearance 11/09/2018  . Memory disorder 07/18/2014  . Chest pain of uncertain etiology 40/97/3532  . Exercise-induced asthma 07/30/2012  . Severe persistent asthma in adult without complication 99/24/2683    Past Surgical History:  Procedure Laterality Date  . ANKLE FRACTURE SURGERY   2006   Right  . APPENDECTOMY    . back fusion    . BACK SURGERY    . BRONCHOSCOPY     S. aureus from BAL  . CARDIAC CATHETERIZATION  November 27, 2006   minimal CAD  . ESOPHAGEAL DILATION  August 06, 2004  . LAPAROSCOPIC CHOLECYSTECTOMY    . LASER ABLATION OF THE CERVIX    . MICRODISSECTION L5-S1  August 13, 2000  . TONSILLECTOMY       OB History   No obstetric history on file.     Family History  Problem Relation Age of Onset  . Allergies Mother   . Clotting disorder Mother   . Diabetes Mother   . Allergies Father   . Heart disease Father   . Heart attack Father   . Diabetes Father   . Allergies Brother   . Chorea Maternal Grandmother     Social History   Tobacco Use  . Smoking status: Former Smoker    Packs/day: 0.50    Years: 8.00    Pack years: 4.00    Types: Cigarettes    Quit date: 06/09/1978    Years since quitting: 42.4  . Smokeless tobacco: Never Used  Vaping Use  . Vaping Use: Never used  Substance Use Topics  . Alcohol use: No  . Drug use: No  Home Medications Prior to Admission medications   Medication Sig Start Date End Date Taking? Authorizing Provider  hydrocortisone (ANUSOL-HC) 2.5 % rectal cream Place 1 application rectally 2 (two) times daily. 11/01/20  Yes Noemi Chapel, MD  hydrocortisone (ANUSOL-HC) 25 MG suppository Place 1 suppository (25 mg total) rectally 2 (two) times daily for 15 days. 11/01/20 11/16/20 Yes Noemi Chapel, MD  albuterol (PROVENTIL HFA;VENTOLIN HFA) 108 (90 BASE) MCG/ACT inhaler Inhale 2 puffs into the lungs every 6 (six) hours as needed. 05/28/11   Chesley Mires, MD  albuterol (PROVENTIL) (2.5 MG/3ML) 0.083% nebulizer solution Take 2.5 mg by nebulization every 6 (six) hours as needed.    [provider]  atorvastatin (LIPITOR) 10 MG tablet Take 10 mg by mouth daily.    [provider]  Benralizumab (FASENRA PEN) 30 MG/ML SOAJ Inject 1 mL into the skin every 8 (eight) weeks. 03/26/20   Chesley Mires, MD   budesonide-formoterol (SYMBICORT) 160-4.5 MCG/ACT inhaler TAKE 2 PUFFS BY MOUTH TWICE A DAY 12/06/19   Chesley Mires, MD  buPROPion (WELLBUTRIN) 100 MG tablet Take 100 mg by mouth daily.    [provider]  carbamazepine (TEGRETOL XR) 100 MG 12 hr tablet Take 300 mg by mouth at bedtime.     [provider]  cetirizine (ZYRTEC) 10 MG tablet Take 10 mg by mouth daily.    [provider]  docusate sodium (COLACE) 100 MG capsule Take 200 mg by mouth daily.    [provider]  lamoTRIgine (LAMICTAL) 200 MG tablet Take 100 mg by mouth daily.    [provider]  meloxicam (MOBIC) 15 MG tablet  05/19/20   [provider]  montelukast (SINGULAIR) 10 MG tablet Take 10 mg by mouth at bedtime.    [provider]  pantoprazole (PROTONIX) 40 MG tablet Take 40 mg by mouth daily.    [provider]    Allergies    Cephalexin, Cephalosporins, Cymbalta [duloxetine hcl], Divalproex sodium, Effexor [venlafaxine hydrochloride], Geodon [ziprasidone hydrochloride], Hydrocodone, Prozac [fluoxetine hcl], Theo-dur [theophylline], Thorazine [chlorpromazine hcl], Topamax [topiramate], Codeine, Morphine, and Ziprasidone  Review of Systems   Review of Systems  Physical Exam Updated Vital Signs BP (!) 155/80 (BP Location: Right Arm)   Pulse 75   Temp 98.7 F (37.1 C) (Oral)   Resp 18   Ht 1.651 m (5\' 5" )   Wt 72.1 kg   SpO2 96%   BMI 26.46 kg/m   Physical Exam Vitals and nursing note reviewed.  Constitutional:      Appearance: She is well-developed. She is not diaphoretic.  HENT:     Head: Normocephalic and atraumatic.  Eyes:     General:        Right eye: No discharge.        Left eye: No discharge.     Conjunctiva/sclera: Conjunctivae normal.  Pulmonary:     Effort: Pulmonary effort is normal. No respiratory distress.  Abdominal:     General: There is no distension.     Tenderness: There is no abdominal tenderness.   Genitourinary:    Comments: Chaperone, nurse Katharine Look present at the bedside, patient has what appears to be a hemorrhoid at the 3 o'clock position, it is approximately the size of a kidney bean, it is very soft, there is a very small area of tenderness at the top of the hemorrhoid, it is not completely thrombosed or thrombosed at all.  There is no active bleeding, the rest of the external exam is  normal, she did not tolerate an internal exam secondary to tenderness Skin:    General: Skin is warm and dry.     Findings: No erythema or rash.  Neurological:     Mental Status: She is alert.     Coordination: Coordination normal.     ED Results / Procedures / Treatments   Labs (all labs ordered are listed, but only abnormal results are displayed) Labs Reviewed - No data to display  EKG None  Radiology No results found.  Procedures Procedures   Medications Ordered in ED Medications - No data to display  ED Course  I have reviewed the triage vital signs and the nursing notes.  Pertinent labs & imaging results that were available during my care of the patient were reviewed by me and considered in my medical decision making (see chart for details).    MDM Rules/Calculators/A&P                          Patient has a hemorrhoid, I do not think it needs to be incised at this time, she will need aggressive topical therapy, Anusol internal therapy and follow-up with specialist.  Patient agreeable.  D/w Dr. Constance Haw - will see in office  Final Clinical Impression(s) / ED Diagnoses Final diagnoses:  Inflamed external hemorrhoid    Rx / DC Orders ED Discharge Orders         Ordered    hydrocortisone (ANUSOL-HC) 2.5 % rectal cream  2 times daily        11/01/20 1528    hydrocortisone (ANUSOL-HC) 25 MG suppository  2 times daily        11/01/20 1528           Noemi Chapel, MD 11/01/20 1538

## 2020-11-01 NOTE — ED Triage Notes (Signed)
Pt reports she was seen by PCP and sent to ED for "large engrossed hemorrhoid"

## 2020-11-09 ENCOUNTER — Encounter (INDEPENDENT_AMBULATORY_CARE_PROVIDER_SITE_OTHER): Payer: Self-pay | Admitting: *Deleted

## 2020-11-09 ENCOUNTER — Telehealth (INDEPENDENT_AMBULATORY_CARE_PROVIDER_SITE_OTHER): Payer: Self-pay

## 2020-11-09 ENCOUNTER — Telehealth (INDEPENDENT_AMBULATORY_CARE_PROVIDER_SITE_OTHER): Payer: Self-pay | Admitting: *Deleted

## 2020-11-09 DIAGNOSIS — D2371 Other benign neoplasm of skin of right lower limb, including hip: Secondary | ICD-10-CM | POA: Diagnosis not present

## 2020-11-09 DIAGNOSIS — D2370 Other benign neoplasm of skin of unspecified lower limb, including hip: Secondary | ICD-10-CM | POA: Diagnosis not present

## 2020-11-09 DIAGNOSIS — S80862A Insect bite (nonvenomous), left lower leg, initial encounter: Secondary | ICD-10-CM | POA: Diagnosis not present

## 2020-11-09 DIAGNOSIS — L718 Other rosacea: Secondary | ICD-10-CM | POA: Diagnosis not present

## 2020-11-09 DIAGNOSIS — B001 Herpesviral vesicular dermatitis: Secondary | ICD-10-CM | POA: Diagnosis not present

## 2020-11-09 DIAGNOSIS — S80861A Insect bite (nonvenomous), right lower leg, initial encounter: Secondary | ICD-10-CM | POA: Diagnosis not present

## 2020-11-09 DIAGNOSIS — S40262A Insect bite (nonvenomous) of left shoulder, initial encounter: Secondary | ICD-10-CM | POA: Diagnosis not present

## 2020-11-09 DIAGNOSIS — L821 Other seborrheic keratosis: Secondary | ICD-10-CM | POA: Diagnosis not present

## 2020-11-09 DIAGNOSIS — D485 Neoplasm of uncertain behavior of skin: Secondary | ICD-10-CM | POA: Diagnosis not present

## 2020-11-09 DIAGNOSIS — L814 Other melanin hyperpigmentation: Secondary | ICD-10-CM | POA: Diagnosis not present

## 2020-11-09 DIAGNOSIS — Z1283 Encounter for screening for malignant neoplasm of skin: Secondary | ICD-10-CM | POA: Diagnosis not present

## 2020-11-09 DIAGNOSIS — S70362A Insect bite (nonvenomous), left thigh, initial encounter: Secondary | ICD-10-CM | POA: Diagnosis not present

## 2020-11-09 DIAGNOSIS — S70361A Insect bite (nonvenomous), right thigh, initial encounter: Secondary | ICD-10-CM | POA: Diagnosis not present

## 2020-11-09 MED ORDER — SUPREP BOWEL PREP KIT 17.5-3.13-1.6 GM/177ML PO SOLN: 1.0000 | Freq: Once | ORAL | 0 refills | Status: AC

## 2020-11-09 MED ORDER — SUTAB 1479-225-188 MG PO TABS: 1.0000 | ORAL_TABLET | Freq: Once | ORAL | 0 refills | Status: AC

## 2020-11-09 NOTE — Telephone Encounter (Signed)
LeighAnn Sebert Stollings, CMA  

## 2020-11-09 NOTE — Telephone Encounter (Signed)
Patient needs

## 2020-11-09 NOTE — Telephone Encounter (Signed)
Done

## 2020-11-14 ENCOUNTER — Telehealth (INDEPENDENT_AMBULATORY_CARE_PROVIDER_SITE_OTHER): Payer: Self-pay | Admitting: *Deleted

## 2020-11-14 NOTE — Telephone Encounter (Signed)
Referring MD/PCP: vyas  Procedure: tcs w propofol  Reason/Indication:  screening  Has patient had this procedure before?  Yes, over 10 yrs ago  If so, when, by whom and where?    Is there a family history of colon cancer?  no  Who?  What age when diagnosed?    Is patient diabetic? If yes, Type 1 or Type 2   no      Does patient have prosthetic heart valve or mechanical valve?  no  Do you have a pacemaker/defibrillator?  no  Has patient ever had endocarditis/atrial fibrillation? no  Does patient use oxygen? no  Has patient had joint replacement within last 12 months?  no  Is patient constipated or do they take laxatives? no  Does patient have a history of alcohol/drug use?  no  Have you had a stroke/heart attack last 6 mths? no  Do you take medicine for weight loss?  no  For female patients,: do you still have your menstrual cycle? no  Is patient on blood thinner such as Coumadin, Plavix and/or Aspirin? no  Medications: symbicort 160 2 puffs bid, bupropion 300 mg, meloxicam 15 mg daily, zyrtec 10 mg daily, vit d3 daily, zinc daily, elderberry daily, l-lysine daily, vit c daily, stool softener, carbamazepine, lamotrigine, pantopazole, montelukast, atorvastatin 10 mg daily  Allergies: cymbalta, effexor, keflex, theo-dur, prozac, topomax, thorazine  Medication Adjustment per Dr Rehman/Dr Jenetta Downer   Procedure date & time: 12/12/20

## 2020-11-15 ENCOUNTER — Other Ambulatory Visit: Payer: Self-pay

## 2020-11-19 ENCOUNTER — Encounter: Payer: Self-pay | Admitting: Orthopedic Surgery

## 2020-11-19 ENCOUNTER — Other Ambulatory Visit: Payer: Self-pay

## 2020-11-19 ENCOUNTER — Ambulatory Visit: Payer: PPO | Admitting: Orthopedic Surgery

## 2020-11-19 ENCOUNTER — Ambulatory Visit: Payer: PPO

## 2020-11-19 VITALS — BP 135/84 | HR 77 | Ht 65.0 in | Wt 158.4 lb

## 2020-11-19 DIAGNOSIS — M25511 Pain in right shoulder: Secondary | ICD-10-CM

## 2020-11-19 DIAGNOSIS — M7541 Impingement syndrome of right shoulder: Secondary | ICD-10-CM | POA: Diagnosis not present

## 2020-11-19 NOTE — Patient Instructions (Signed)

## 2020-11-19 NOTE — Progress Notes (Signed)
Established patient new problem Chief Complaint  Patient presents with   Shoulder Pain    Right shoulder pain started a week and a half ago    Nancy [name] is 55 years old she has a 1-1/2-week history of acute pain in her right shoulder with decreased range of motion.  She denies any trauma.  Examination shows decreased range of motion in all planes positive impingement sign at 150 degrees pain with resisted flexion and abduction but 5 out of 5 strength  X-ray was normal  We injected the right subacromial space and put her on home exercises should resolve on its own  Procedure note right shoulder subacromial space injection  Medication Depo-Medrol 40 lidocaine 1% 2 cc  Patient gave verbal consent Timeout was taken to confirm the right shoulder as the injection site  We injected the shoulder without complication Encounter Diagnoses  Name Primary?   Acute pain of right shoulder    Shoulder impingement, right Yes

## 2020-11-20 DIAGNOSIS — M4716 Other spondylosis with myelopathy, lumbar region: Secondary | ICD-10-CM | POA: Diagnosis not present

## 2020-11-20 DIAGNOSIS — M5106 Intervertebral disc disorders with myelopathy, lumbar region: Secondary | ICD-10-CM | POA: Diagnosis not present

## 2020-11-20 DIAGNOSIS — M5412 Radiculopathy, cervical region: Secondary | ICD-10-CM | POA: Diagnosis not present

## 2020-11-20 DIAGNOSIS — Z6830 Body mass index (BMI) 30.0-30.9, adult: Secondary | ICD-10-CM | POA: Diagnosis not present

## 2020-11-20 DIAGNOSIS — M542 Cervicalgia: Secondary | ICD-10-CM | POA: Diagnosis not present

## 2020-12-05 NOTE — Patient Instructions (Signed)
Nancy [name]  12/05/2020     @PREFPERIOPPHARMACY @   Your procedure is scheduled on 12/12/2020.  Report to [facility] at 6:50 A.M.  Call this number if you have problems the morning of surgery:  [phone]   Remember:  Do not eat or drink after midnight.   Please follow the diet and prep instructions given to you by your doctor's office.                      Water, Juice (non-citric and without pulp - diabetics please choose diet or no sugar options), Carbonated beverages - (diabetics please choose diet or no sugar options), Clear Tea, Black Coffee only (no creamer, milk or cream including half and half), Plain Jell-O only (diabetics please choose diet or no sugar options), Gatorade (diabetics please choose diet or no sugar options), and Plain Popsicles only    Take these medicines the morning of surgery with A SIP OF WATER : Zyrtec, Mobic, Protonix and Welbutrin.    Please use your Symbicort and your Albuterol before coming to the hospital.    Do not wear jewelry, make-up or nail polish.  Do not wear lotions, powders, or perfumes, or deodorant.  Do not shave 48 hours prior to surgery.  Men may shave face and neck.  Do not bring valuables to the hospital.  [facility] is not responsible for any belongings or valuables.  Contacts, dentures or bridgework may not be worn into surgery.  Leave your suitcase in the car.  After surgery it may be brought to your room.  For patients admitted to the hospital, discharge time will be determined by your treatment team.  Patients discharged the day of surgery will not be allowed to drive home.   Name and phone number of your driver:   family Special instructions:  n/a  Please read over the following fact sheets that you were given. Care and Recovery After Surgery  Colonoscopy, Adult A colonoscopy is a procedure to look at the entire large intestine. This procedure is done using a long, thin, flexible tube that has a camera on  theend. You may have a colonoscopy: As a part of normal colorectal screening. If you have certain symptoms, such as: A low number of red blood cells in your blood (anemia). Diarrhea that does not go away. Pain in your abdomen. Blood in your stool. A colonoscopy can help screen for and diagnose medical problems, including: Tumors. Extra tissue that grows where mucus forms (polyps). Inflammation. Areas of bleeding. Tell your health care provider about: Any allergies you have. All medicines you are taking, including vitamins, herbs, eye drops, creams, and over-the-counter medicines. Any problems you or family members have had with anesthetic medicines. Any blood disorders you have. Any surgeries you have had. Any medical conditions you have. Any problems you have had with having bowel movements. Whether you are pregnant or may be pregnant. What are the risks? Generally, this is a safe procedure. However, problems may occur, including: Bleeding. Damage to your intestine. Allergic reactions to medicines given during the procedure. Infection. This is rare. What happens before the procedure? Eating and drinking restrictions Follow instructions from your health care provider about eating or drinking restrictions, which may include: A few days before the procedure: Follow a low-fiber diet. Avoid nuts, seeds, dried fruit, raw fruits, and vegetables. 1-3 days before the procedure: Eat only gelatin dessert or ice pops. Drink only clear liquids, such as water, clear juice,  clear broth or bouillon, black coffee or tea, or clear soft drinks or sports drinks. Avoid liquids that contain red or purple dye. The day of the procedure: Do not eat solid foods. You may continue to drink clear liquids until up to 2 hours before the procedure. Do not eat or drink anything starting 2 hours before the procedure, or within the time period that your health care provider recommends. Bowel prep If you were  prescribed a bowel prep to take by mouth (orally) to clean out your colon: Take it as told by your health care provider. Starting the day before your procedure, you will need to drink a large amount of liquid medicine. The liquid will cause you to have many bowel movements of loose stool until your stool becomes almost clear or light green. If your skin or the opening between the buttocks (anus) gets irritated from diarrhea, you may relieve the irritation using: Wipes with medicine in them, such as adult wet wipes with aloe and vitamin E. A product to soothe skin, such as petroleum jelly. If you vomit while drinking the bowel prep: Take a break for up to 60 minutes. Begin the bowel prep again. Call your health care provider if you keep vomiting or you cannot take the bowel prep without vomiting. To clean out your colon, you may also be given: Laxative medicines. These help you have a bowel movement. Instructions for enema use. An enema is liquid medicine injected into your rectum. Medicines Ask your health care provider about: Changing or stopping your regular medicines or supplements. This is especially important if you are taking iron supplements, diabetes medicines, or blood thinners. Taking medicines such as aspirin and ibuprofen. These medicines can thin your blood. Do not take these medicines unless your health care provider tells you to take them. Taking over-the-counter medicines, vitamins, herbs, and supplements. General instructions Ask your health care provider what steps will be taken to help prevent infection. These may include washing skin with a germ-killing soap. Plan to have someone take you home from the hospital or clinic. What happens during the procedure?  An IV will be inserted into one of your veins. You may be given one or more of the following: A medicine to help you relax (sedative). A medicine to numb the area (local anesthetic). A medicine to make you fall asleep  (general anesthetic). This is rarely needed. You will lie on your side with your knees bent. The tube will: Have oil or gel put on it (be lubricated). Be inserted into your anus. Be gently eased through all parts of your large intestine. Air will be sent into your colon to keep it open. This may cause some pressure or cramping. Images will be taken with the camera and will appear on a screen. A small tissue sample may be removed to be looked at under a microscope (biopsy). The tissue may be sent to a lab for testing if any signs of problems are found. If small polyps are found, they may be removed and checked for cancer cells. When the procedure is finished, the tube will be removed. The procedure may vary among health care providers and hospitals. What happens after the procedure? Your blood pressure, heart rate, breathing rate, and blood oxygen level will be monitored until you leave the hospital or clinic. You may have a small amount of blood in your stool. You may pass gas and have mild cramping or bloating in your abdomen. This is caused by the  air that was used to open your colon during the exam. Do not drive for 24 hours after the procedure. It is up to you to get the results of your procedure. Ask your health care provider, or the department that is doing the procedure, when your results will be ready. Summary A colonoscopy is a procedure to look at the entire large intestine. Follow instructions from your health care provider about eating and drinking before the procedure. If you were prescribed an oral bowel prep to clean out your colon, take it as told by your health care provider. During the colonoscopy, a flexible tube with a camera on its end is inserted into the anus and then passed into the other parts of the large intestine. This information is not intended to replace advice given to you by your health care provider. Make sure you discuss any questions you have with your  healthcare provider. Document Revised: 12/17/2018 Document Reviewed: 12/17/2018 Elsevier Patient Education  [place].

## 2020-12-06 ENCOUNTER — Other Ambulatory Visit: Payer: Self-pay

## 2020-12-06 ENCOUNTER — Encounter (HOSPITAL_COMMUNITY)
Admission: RE | Admit: 2020-12-06 | Discharge: 2020-12-06 | Disposition: A | Discharge status: Home / Self Care | Payer: PPO | Source: Ambulatory Visit | Attending: Internal Medicine | Admitting: Internal Medicine

## 2020-12-06 ENCOUNTER — Encounter (HOSPITAL_COMMUNITY): Payer: Self-pay | Admitting: Internal Medicine

## 2020-12-11 ENCOUNTER — Other Ambulatory Visit (HOSPITAL_COMMUNITY): Payer: PPO

## 2020-12-12 ENCOUNTER — Ambulatory Visit: Payer: PPO | Admitting: Orthopedic Surgery

## 2020-12-12 ENCOUNTER — Ambulatory Visit (HOSPITAL_COMMUNITY)
Admission: RE | Admit: 2020-12-12 | Discharge: 2020-12-12 | Disposition: A | Discharge status: Home / Self Care | Payer: PPO | Attending: Internal Medicine | Admitting: Internal Medicine

## 2020-12-12 ENCOUNTER — Ambulatory Visit (HOSPITAL_COMMUNITY): Payer: PPO | Admitting: Anesthesiology

## 2020-12-12 ENCOUNTER — Encounter (HOSPITAL_COMMUNITY): Payer: Self-pay | Admitting: Internal Medicine

## 2020-12-12 ENCOUNTER — Encounter (HOSPITAL_COMMUNITY)
Admission: RE | Disposition: A | Discharge status: Home / Self Care | Payer: Self-pay | Source: Home / Self Care | Attending: Internal Medicine

## 2020-12-12 ENCOUNTER — Encounter (INDEPENDENT_AMBULATORY_CARE_PROVIDER_SITE_OTHER): Payer: Self-pay | Admitting: *Deleted

## 2020-12-12 DIAGNOSIS — Z881 Allergy status to other antibiotic agents status: Secondary | ICD-10-CM | POA: Insufficient documentation

## 2020-12-12 DIAGNOSIS — Z87891 Personal history of nicotine dependence: Secondary | ICD-10-CM | POA: Diagnosis not present

## 2020-12-12 DIAGNOSIS — K5730 Diverticulosis of large intestine without perforation or abscess without bleeding: Secondary | ICD-10-CM | POA: Insufficient documentation

## 2020-12-12 DIAGNOSIS — K6389 Other specified diseases of intestine: Secondary | ICD-10-CM | POA: Insufficient documentation

## 2020-12-12 DIAGNOSIS — Z79899 Other long term (current) drug therapy: Secondary | ICD-10-CM | POA: Insufficient documentation

## 2020-12-12 DIAGNOSIS — Z885 Allergy status to narcotic agent status: Secondary | ICD-10-CM | POA: Insufficient documentation

## 2020-12-12 DIAGNOSIS — Z791 Long term (current) use of non-steroidal anti-inflammatories (NSAID): Secondary | ICD-10-CM | POA: Insufficient documentation

## 2020-12-12 DIAGNOSIS — K644 Residual hemorrhoidal skin tags: Secondary | ICD-10-CM | POA: Diagnosis not present

## 2020-12-12 DIAGNOSIS — Z1211 Encounter for screening for malignant neoplasm of colon: Secondary | ICD-10-CM | POA: Diagnosis not present

## 2020-12-12 DIAGNOSIS — Z888 Allergy status to other drugs, medicaments and biological substances status: Secondary | ICD-10-CM | POA: Diagnosis not present

## 2020-12-12 DIAGNOSIS — Z7951 Long term (current) use of inhaled steroids: Secondary | ICD-10-CM | POA: Insufficient documentation

## 2020-12-12 DIAGNOSIS — K6289 Other specified diseases of anus and rectum: Secondary | ICD-10-CM | POA: Diagnosis not present

## 2020-12-12 DIAGNOSIS — F319 Bipolar disorder, unspecified: Secondary | ICD-10-CM | POA: Diagnosis not present

## 2020-12-12 HISTORY — PX: COLONOSCOPY WITH PROPOFOL: SHX5780

## 2020-12-12 LAB — HM COLONOSCOPY

## 2020-12-12 SURGERY — COLONOSCOPY WITH PROPOFOL
Anesthesia: General

## 2020-12-12 MED ORDER — PROPOFOL 10 MG/ML IV BOLUS
INTRAVENOUS | Status: DC | PRN
  Administered 2020-12-12 (×2): 20 mg via INTRAVENOUS
  Administered 2020-12-12: 50 mg via INTRAVENOUS
  Administered 2020-12-12: 30 mg via INTRAVENOUS
  Administered 2020-12-12: 50 mg via INTRAVENOUS
  Administered 2020-12-12: 20 mg via INTRAVENOUS

## 2020-12-12 MED ORDER — LIDOCAINE HCL (PF) 2 % IJ SOLN
INTRAMUSCULAR | Status: AC
  Filled 2020-12-12: qty 10

## 2020-12-12 MED ORDER — LACTATED RINGERS IV SOLN
INTRAVENOUS | Status: DC
  Administered 2020-12-12: 1000 mL via INTRAVENOUS

## 2020-12-12 MED ORDER — STERILE WATER FOR IRRIGATION IR SOLN
Status: DC | PRN
  Administered 2020-12-12: 5 mL

## 2020-12-12 MED ORDER — PROPOFOL 500 MG/50ML IV EMUL
INTRAVENOUS | Status: DC | PRN
  Administered 2020-12-12: 150 ug/kg/min via INTRAVENOUS

## 2020-12-12 MED ORDER — PROPOFOL 10 MG/ML IV BOLUS
INTRAVENOUS | Status: AC
  Filled 2020-12-12: qty 60

## 2020-12-12 NOTE — Transfer of Care (Signed)
Immediate Anesthesia Transfer of Care Note  Patient: Nancy [name]  Procedure(s) Performed: COLONOSCOPY WITH PROPOFOL  Patient Location: Short Stay  Anesthesia Type:General  Level of Consciousness: awake  Airway & Oxygen Therapy: Patient Spontanous Breathing  Post-op Assessment: Report given to RN  Post vital signs: Reviewed  Last Vitals:  Vitals Value Taken Time  BP    Temp    Pulse    Resp    SpO2      Last Pain:  Vitals:   12/12/20 0717  TempSrc: Oral  PainSc: 0-No pain         Complications: No notable events documented.

## 2020-12-12 NOTE — H&P (Signed)
Nancy [name] is an 55 y.o. female.   Chief Complaint: Patient is here for colonoscopy. HPI: Patient is 55 year old Caucasian female who is here for screening colonoscopy.  She had diagnostic colonoscopy over 10 years ago but no polyps were found. She denies abdominal pain change in bowel habits or rectal bleeding. She is on Mobic daily.  She does not take aspirin or anticoagulants. He has chronic neck and low back pain as well as pain involving other joints secondary to DJD and osteoarthrosis. Family history is negative for CRC.  Past Medical History:  Diagnosis Date   Allergic rhinitis    Asthma    Benign familial tremor    Bipolar 1 disorder (HCC)    Chronic neck and back pain    Degenerative disk disease    Dyslipidemia    Fibromyalgia    GERD (gastroesophageal reflux disease)    Hiatal hernia    Irritable bowel syndrome (IBS)    Memory disorder 07/18/2014   Memory loss    Migraine headache    Nephrolithiasis    Pneumonia 1986   Serotonin syndrome     Past Surgical History:  Procedure Laterality Date   ANKLE FRACTURE SURGERY  2006   Right   APPENDECTOMY     back fusion     BACK SURGERY     BRONCHOSCOPY     S. aureus from BAL   CARDIAC CATHETERIZATION  11/27/2006   minimal CAD   ESOPHAGEAL DILATION  08/06/2004   LAPAROSCOPIC CHOLECYSTECTOMY     LASER ABLATION OF THE CERVIX     MICRODISSECTION L5-S1  08/13/2000   neck fusion     TONSILLECTOMY      Family History  Problem Relation Age of Onset   Allergies Mother    Clotting disorder Mother    Diabetes Mother    Allergies Father    Heart disease Father    Heart attack Father    Diabetes Father    Allergies Brother    Chorea Maternal Grandmother    Social History:  reports that she quit smoking about 42 years ago. Her smoking use included cigarettes. She has a 4.00 pack-year smoking history. She has never used smokeless tobacco. She reports that she does not drink alcohol and does not use drugs.  Allergies:   Allergies  Allergen Reactions   Cephalosporins    Cymbalta [Duloxetine Hcl] Other (See Comments)    serotonin syndrome   Divalproex Sodium Other (See Comments)    unknown   Effexor [Venlafaxine Hydrochloride] Other (See Comments)    serotonin syndrome   Geodon [Ziprasidone Hydrochloride] Other (See Comments)    Hands and feet tingling.   Prozac [Fluoxetine Hcl] Other (See Comments)    suicidal   Theo-Dur [Theophylline] Other (See Comments)    unknown   Thorazine [Chlorpromazine Hcl] Other (See Comments)    unknown   Topamax [Topiramate] Other (See Comments)   Cephalexin Rash    Joint swelling   Codeine Nausea Only    Pt states must take phenergan with codeine.       Morphine Nausea And Vomiting and Nausea Only        Ziprasidone Nausea Only and Other (See Comments)    Hands and feet tingling.    Medications Prior to Admission  Medication Sig Dispense Refill   albuterol (PROVENTIL HFA;VENTOLIN HFA) 108 (90 BASE) MCG/ACT inhaler Inhale 2 puffs into the lungs every 6 (six) hours as needed. (Patient taking differently: Inhale 2 puffs into the lungs  every 6 (six) hours as needed for shortness of breath or wheezing.) 1 Inhaler 5   albuterol (PROVENTIL) (2.5 MG/3ML) 0.083% nebulizer solution Take 2.5 mg by nebulization every 6 (six) hours as needed for shortness of breath or wheezing.     Ascorbic Acid (VITAMIN C) 1000 MG tablet Take 1,000 mg by mouth daily.     atorvastatin (LIPITOR) 10 MG tablet Take 10 mg by mouth daily.     budesonide-formoterol (SYMBICORT) 160-4.5 MCG/ACT inhaler TAKE 2 PUFFS BY MOUTH TWICE A DAY (Patient taking differently: Inhale 2 puffs into the lungs 2 (two) times daily.) 30.6 Inhaler 3   buPROPion (WELLBUTRIN XL) 300 MG 24 hr tablet Take 300 mg by mouth every morning.     carbamazepine (TEGRETOL) 100 MG chewable tablet Chew 400 mg by mouth at bedtime.     cetirizine (ZYRTEC) 10 MG tablet Take 10 mg by mouth daily.     Cholecalciferol (VITAMIN D3) 50 MCG  (2000 UT) capsule Take 2,000 Units by mouth daily.     Cyanocobalamin (VITAMIN B-12) 2500 MCG SUBL Place 2,500 mcg under the tongue daily.     docusate sodium (COLACE) 50 MG capsule Take 150 mg by mouth 2 (two) times daily.     lamoTRIgine (LAMICTAL) 150 MG tablet Take 300 mg by mouth at bedtime.     meloxicam (MOBIC) 15 MG tablet Take 15 mg by mouth daily.     montelukast (SINGULAIR) 10 MG tablet Take 10 mg by mouth at bedtime.     pantoprazole (PROTONIX) 40 MG tablet Take 40 mg by mouth daily.     valACYclovir (VALTREX) 1000 MG tablet Take 1 g by mouth daily as needed (Cold sore).     zinc gluconate 50 MG tablet Take 50 mg by mouth daily.     Benralizumab (FASENRA PEN) 30 MG/ML SOAJ Inject 1 mL into the skin every 8 (eight) weeks. (Patient not taking: Reported on 11/29/2020) 1 mL 5   EPINEPHrine 0.3 mg/0.3 mL IJ SOAJ injection Inject 0.3 mg into the muscle as needed for anaphylaxis.      No results found for this or any previous visit (from the past 48 hour(s)). No results found.  Review of Systems  Blood pressure 131/70, temperature 98 F (36.7 C), temperature source Oral, height 5\' 5"  (1.651 m), weight 69.4 kg, SpO2 96 %. Physical Exam HENT:     Mouth/Throat:     Mouth: Mucous membranes are moist.     Pharynx: Oropharynx is clear.  Eyes:     General: No scleral icterus.    Conjunctiva/sclera: Conjunctivae normal.  Cardiovascular:     Rate and Rhythm: Normal rate and regular rhythm.     Heart sounds: Normal heart sounds. No murmur heard. Pulmonary:     Effort: Pulmonary effort is normal.     Breath sounds: Normal breath sounds.  Abdominal:     Comments: Abdomen is symmetrical.  She has appendectomy scar along with laparoscopy scars from prior cholecystectomy.  On palpation abdomen is soft and nontender with organomegaly or masses.  Musculoskeletal:        General: No swelling.     Cervical back: Neck supple.  Lymphadenopathy:     Cervical: No cervical adenopathy.  Skin:     General: Skin is warm and dry.  Neurological:     Mental Status: She is alert.     Assessment/Plan  Average risk screening colonoscopy.  Hildred Laser, MD 12/12/2020, 8:11 AM

## 2020-12-12 NOTE — Anesthesia Preprocedure Evaluation (Addendum)
Anesthesia Evaluation  Patient identified by MRN, date of birth, ID band Patient awake    Reviewed: Allergy & Precautions, NPO status , Patient's Chart, lab work & pertinent test results  Airway Mallampati: II  TM Distance: >3 FB Neck ROM: Full   Comment: Neck fusion Dental  (+) Dental Advisory Given Crown :   Pulmonary asthma , pneumonia, resolved, former smoker,    Pulmonary exam normal breath sounds clear to auscultation       Cardiovascular Exercise Tolerance: Good Normal cardiovascular exam Rhythm:Regular Rate:Normal     Neuro/Psych  Headaches, PSYCHIATRIC DISORDERS Bipolar Disorder  Neuromuscular disease    GI/Hepatic hiatal hernia, GERD  Medicated and Controlled,  Endo/Other    Renal/GU Renal InsufficiencyRenal disease     Musculoskeletal  (+) Arthritis  (neck and back pain), Fibromyalgia -  Abdominal   Peds  Hematology   Anesthesia Other Findings serotonin syndrome   Reproductive/Obstetrics                           Anesthesia Physical Anesthesia Plan  ASA: 2  Anesthesia Plan: General   Post-op Pain Management:    Induction: Intravenous  PONV Risk Score and Plan: Propofol infusion  Airway Management Planned: Nasal Cannula and Natural Airway  Additional Equipment:   Intra-op Plan:   Post-operative Plan:   Informed Consent: I have reviewed the patients History and Physical, chart, labs and discussed the procedure including the risks, benefits and alternatives for the proposed anesthesia with the patient or authorized representative who has indicated his/her understanding and acceptance.     Dental advisory given  Plan Discussed with: CRNA and Surgeon  Anesthesia Plan Comments:         Anesthesia Quick Evaluation

## 2020-12-12 NOTE — Op Note (Signed)
[facility] Patient Name: Nancy [name] Procedure Date: 12/12/2020 8:02 AM MRN: [id number] Date of Birth: 12-27-1965 Attending MD: Hildred Laser , MD CSN: [id number] Age: 55 Admit Type: Outpatient Procedure:                Colonoscopy Indications:              Screening for colorectal malignant neoplasm Providers:                Hildred Laser, MD, Lurline Del, RN, Casimer Bilis, Technician Referring MD:             Glenda Chroman, MD Medicines:                Propofol per Anesthesia Complications:            No immediate complications. Estimated Blood Loss:     Estimated blood loss: none. Procedure:                Pre-Anesthesia Assessment:                           - Prior to the procedure, a History and Physical                            was performed, and patient medications and                            allergies were reviewed. The patient's tolerance of                            previous anesthesia was also reviewed. The risks                            and benefits of the procedure and the sedation                            options and risks were discussed with the patient.                            All questions were answered, and informed consent                            was obtained. Prior Anticoagulants: The patient has                            taken no previous anticoagulant or antiplatelet                            agents except for NSAID medication. ASA Grade                            Assessment: II - A patient with mild systemic  disease. After reviewing the risks and benefits,                            the patient was deemed in satisfactory condition to                            undergo the procedure.                           After obtaining informed consent, the colonoscope                            was passed under direct vision. Throughout the                            procedure, the  patient's blood pressure, pulse, and                            oxygen saturations were monitored continuously. The                            [id number]-H190DL ([id number]) scope was introduced through                            the anus and advanced to the the cecum, identified                            by appendiceal orifice and ileocecal valve. The                            colonoscopy was performed without difficulty. The                            patient tolerated the procedure well. The quality                            of the bowel preparation was adequate. The                            ileocecal valve, appendiceal orifice, and rectum                            were photographed. Scope In: 8:23:07 AM Scope Out: 8:41:54 AM Scope Withdrawal Time: 0 hours 14 minutes 32 seconds  Total Procedure Duration: 0 hours 18 minutes 47 seconds  Findings:      Skin tags were found on perianal exam.      A few small-mouthed diverticula were found in the sigmoid colon, splenic       flexure and hepatic flexure.      A diffuse area of mild melanosis was found in the entire colon.      External hemorrhoids were found during retroflexion. The hemorrhoids       were small.      Anal papilla(e) were hypertrophied.      The terminal ileum appeared normal. Impression:               -  Perianal skin tags found on perianal exam.                           - Diverticulosis in the sigmoid colon, at the                            splenic flexure and at the hepatic flexure.                           - Melanosis in the colon.                           - External hemorrhoids.                           - Anal papilla(e) were hypertrophied.                           - No specimens collected. Moderate Sedation:      Per Anesthesia Care Recommendation:           - Patient has a contact number available for                            emergencies. The signs and symptoms of potential                            delayed  complications were discussed with the                            patient. Return to normal activities tomorrow.                            Written discharge instructions were provided to the                            patient.                           - High fiber diet today.                           - Continue present medications.                           - Repeat colonoscopy in 10 years for screening                            purposes. Procedure Code(s):        --- Professional ---                           [phone], Colonoscopy, flexible; diagnostic, including                            collection of specimen(s) by brushing or washing,  when performed (separate procedure) Diagnosis Code(s):        --- Professional ---                           K64.4, Residual hemorrhoidal skin tags                           Z12.11, Encounter for screening for malignant                            neoplasm of colon                           K63.89, Other specified diseases of intestine                           K62.89, Other specified diseases of anus and rectum                           K57.30, Diverticulosis of large intestine without                            perforation or abscess without bleeding CPT copyright 2019 [employer]. All rights reserved. The codes documented in this report are preliminary and upon coder review may  be revised to meet current compliance requirements. Hildred Laser, MD Hildred Laser, MD 12/12/2020 8:50:39 AM This report has been signed electronically. Number of Addenda: 0

## 2020-12-12 NOTE — Discharge Instructions (Addendum)
Resume usual medications as before. High-fiber diet. No driving for 24 hours. Next screening exam in 10 years.

## 2020-12-12 NOTE — Anesthesia Postprocedure Evaluation (Signed)
Anesthesia Post Note  Patient: Alga A Cheyney  Procedure(s) Performed: COLONOSCOPY WITH PROPOFOL  Patient location during evaluation: Short Stay Anesthesia Type: General Level of consciousness: awake and alert Pain management: pain level controlled Vital Signs Assessment: post-procedure vital signs reviewed and stable Respiratory status: spontaneous breathing Cardiovascular status: blood pressure returned to baseline and stable Postop Assessment: no apparent nausea or vomiting Anesthetic complications: no   No notable events documented.   Last Vitals:  Vitals:   12/12/20 0717  BP: 131/70  Temp: 36.7 C  SpO2: 96%    Last Pain:  Vitals:   12/12/20 0717  TempSrc: Oral  PainSc: 0-No pain                 Bartow Zylstra

## 2020-12-19 ENCOUNTER — Other Ambulatory Visit: Payer: Self-pay

## 2020-12-19 ENCOUNTER — Ambulatory Visit (INDEPENDENT_AMBULATORY_CARE_PROVIDER_SITE_OTHER): Payer: PPO | Admitting: Orthopedic Surgery

## 2020-12-19 ENCOUNTER — Encounter: Payer: Self-pay | Admitting: Orthopedic Surgery

## 2020-12-19 VITALS — BP 125/78 | HR 76 | Ht 65.0 in | Wt 157.4 lb

## 2020-12-19 DIAGNOSIS — G8929 Other chronic pain: Secondary | ICD-10-CM | POA: Diagnosis not present

## 2020-12-19 DIAGNOSIS — M958 Other specified acquired deformities of musculoskeletal system: Secondary | ICD-10-CM | POA: Diagnosis not present

## 2020-12-19 DIAGNOSIS — M25561 Pain in right knee: Secondary | ICD-10-CM

## 2020-12-19 NOTE — Progress Notes (Signed)
FOLLOW UP   Encounter Diagnoses  Name Primary?   Chronic pain of right knee Yes   Osteochondral defect of condyle of femur      Chief Complaint  Patient presents with   Knee Pain    6 wk follow up/pt says knee still hurts but feels a little bit better   Weightbearing x-rays show normal alignment and articulation of the femur and tibia despite MRI showing a medial femoral condylar defect    Nancy [name] reports less pain in the tibial area but still having overall pain in the right knee  She is in economy hinged knee brace and takes Mobic for her arthritic pain  Recommend office visit for periodic flareups once a year x-rays to check on progression of arthritis  Chronic pain knee stable low risk for morbidity

## 2021-01-10 ENCOUNTER — Other Ambulatory Visit: Payer: Self-pay | Admitting: Pulmonary Disease

## 2021-01-15 DIAGNOSIS — Z299 Encounter for prophylactic measures, unspecified: Secondary | ICD-10-CM | POA: Diagnosis not present

## 2021-01-15 DIAGNOSIS — F319 Bipolar disorder, unspecified: Secondary | ICD-10-CM | POA: Diagnosis not present

## 2021-01-15 DIAGNOSIS — J069 Acute upper respiratory infection, unspecified: Secondary | ICD-10-CM | POA: Diagnosis not present

## 2021-01-16 DIAGNOSIS — Z20822 Contact with and (suspected) exposure to covid-19: Secondary | ICD-10-CM | POA: Diagnosis not present

## 2021-01-18 DIAGNOSIS — Z1231 Encounter for screening mammogram for malignant neoplasm of breast: Secondary | ICD-10-CM | POA: Diagnosis not present

## 2021-02-06 ENCOUNTER — Telehealth: Payer: Self-pay | Admitting: Pulmonary Disease

## 2021-02-06 NOTE — Telephone Encounter (Signed)
Called and spoke with patient. She stated that she and her entire family had an respiratory virus about 2 weeks ago. Everyone has been tested numerous times for the flu and covid. Everyone has received negative test results. She is still experiencing a cough and increased SOB. She is having to use her albuterol several times a day. Before this episode, she rarely used her albuterol.   I attempted to get her scheduled to see Dr. Halford Chessman but he did not have any openings until October. Attempted to get her scheduled with TP on 02/08/21 but she wanted an early morning appt. I was able to get her scheduled for 930am with Beth. She verbalized understanding.   Nothing further needed at time of call.

## 2021-02-08 ENCOUNTER — Ambulatory Visit: Payer: PPO | Admitting: Primary Care

## 2021-02-08 ENCOUNTER — Telehealth: Payer: Self-pay | Admitting: Primary Care

## 2021-02-08 ENCOUNTER — Other Ambulatory Visit: Payer: Self-pay

## 2021-02-08 ENCOUNTER — Encounter: Payer: Self-pay | Admitting: Primary Care

## 2021-02-08 VITALS — BP 122/64 | HR 78 | Temp 98.2°F | Ht 65.0 in | Wt 159.2 lb

## 2021-02-08 DIAGNOSIS — J4550 Severe persistent asthma, uncomplicated: Secondary | ICD-10-CM

## 2021-02-08 MED ORDER — PREDNISONE 10 MG PO TABS: ORAL_TABLET | ORAL | 0 refills | Status: DC

## 2021-02-08 MED ORDER — METHYLPREDNISOLONE ACETATE 80 MG/ML IJ SUSP
80.0000 mg | Freq: Once | INTRAMUSCULAR | Status: AC
  Administered 2021-02-08: 80 mg via INTRAMUSCULAR

## 2021-02-08 NOTE — Progress Notes (Signed)
Reviewed and agree with assessment/plan.   Chesley Mires, MD [facility] Pulmonary/Critical Care 02/08/2021, 12:15 PM Pager:  [phone]

## 2021-02-08 NOTE — Patient Instructions (Addendum)
Recommendations: - Continue Symbicort 134mg 2 puffs morning and evening (rinse mouth after use) - Continue Zyrtec and Singulair as prescribed - We will work with our pharmacist to get you re-approved for [employer] [place] injections - Try over the counter Delsym cough syrup twice daily; Chlorpheniramine '4mg'$  tablet every 4 hours for cough (may cause drowsiness) - Follow GERD diet   Rx: - Depro medrol '80mg'$  IM x1  - Prednisone taper ('40mg'$  x 3 days; '30mg'$  x 3 days; '20mg'$  x 3 days; '10mg'$  x 3 days)  Follow-up: - 2-3 months with Dr. SHalford Chessmanor sooner if needed

## 2021-02-08 NOTE — Assessment & Plan Note (Addendum)
-   Patient had recent viral illness 4 weeks ago and develop persistent dry cough and chest tightness. Symptoms are consistent with post viral cough syndrome and uncontrolled asthma. She is compliant with Symbicort 160, Zyrtec, Singulair and Protonix. She is using SABA 3-4 times a day. Currently off Fasenra d/t prescription running out. She received depo-medrol '80mg'$  IM x1 in office. Sending in RX prednisone taper '40mg'$  x 3 days, '30mg'$  x 3 days, '20mg'$  x 3 days, '10mg'$  x 3 days. Recommend she use delsym cough syrup q12 hours and chlorpheniramine '4mg'$  q 4 hours as needed for cough. We will start process to resume biologic therapy. FU ibn 2-3 months with Dr. Halford Chessman.

## 2021-02-08 NOTE — Progress Notes (Signed)
_0  ID: Harden Mo, female    DOB: 1965-12-30, 55 y.o.   MRN: [id number]  Chief Complaint  Patient presents with   Follow-up    Pt states she has had symptoms x4 weeks including a cough that is mainly a dry cough and chest tightness. States that she did take a covid test which came back negative.    Referring provider: Glenda Chroman, MD  HPI: 55 year old female, former smoker quit 1980 (4-pack-year history). Medical history significant for persistent asthma, allergic rhinitis, memory disorder.  Patient of Dr. Halford Chessman, last seen in office on 12/30/2019.She is maintained on Symbicort 154mg 2 puffs BID, Singulair, Zyrtec and Fasenra.    02/08/2021- Interim hx  Patient presents today for acute office visit with complaints of cough. She developed cough 4 weeks ago about viral illness. She was negative for covid, influenza A/B. She was originally treated with abx/steriod dose pack from her PCP. She is compliant will all her medications including Symbicort 160. She is no longer on Fasenra injections. She is having to use her Albuterol rescue inhaler 3-4 times a day with some improvement but cough still remains persistent. She is taking Zyrtec and singulair. She has some reflux symptoms. She takes Protonix 472mdaily and prn Famotidine. Denies post nasal drip sympoms.    TESTING:  Spirometry 03/05/10>>FEV1 2.43(87%), FEV1% 93 IgE 2008>>412 IgE 04/01/11>>162.1 Xolair from 11/23/07 to 03/05/10>>stopped due to insurance issues RAST 04/01/11>>Cat dander, dog dander, dust mites PFT 05/28/11>>FEV1 3.01(112%), FEV1% 78, TLC 5.64(112%), RV 2.28(135%), DLCO 99%, positive bronchodilator response. December 2012 >> resumed xolair >> stopped Summer 2014 due to finances >> resumed September 2014 January 2016 >> xolair d/c'ed due to finances FeNO 08/19/16 >> 10 Spirometry 08/19/16 >> FEV1 2.49 (84%), FEV1% 79 Labs 08/19/16 >> IgE 120  Allergies  Allergen Reactions   Cephalosporins    Cymbalta  [Duloxetine Hcl] Other (See Comments)    serotonin syndrome   Divalproex Sodium Other (See Comments)    unknown   Effexor [Venlafaxine Hydrochloride] Other (See Comments)    serotonin syndrome   Geodon [Ziprasidone Hydrochloride] Other (See Comments)    Hands and feet tingling.   Prozac [Fluoxetine Hcl] Other (See Comments)    suicidal   Theo-Dur [Theophylline] Other (See Comments)    unknown   Thorazine [Chlorpromazine Hcl] Other (See Comments)    unknown   Topamax [Topiramate] Other (See Comments)   Cephalexin Rash    Joint swelling   Codeine Nausea Only    Pt states must take phenergan with codeine.       Morphine Nausea And Vomiting and Nausea Only        Ziprasidone Nausea Only and Other (See Comments)    Hands and feet tingling.    Immunization History  Administered Date(s) Administered   Influenza Split 02/19/2011   Influenza Whole 03/09/2012   Influenza,inj,Quad PF,6+ Mos 03/08/2013, 03/01/2014, 02/08/2015, 02/04/2016, 03/03/2017, 01/26/2019   Moderna Sars-Covid-2 Vaccination 12/15/2019   Pneumococcal Polysaccharide-23 02/15/2006    Past Medical History:  Diagnosis Date   Allergic rhinitis    Asthma    Benign familial tremor    Bipolar 1 disorder (HCC)    Chronic neck and back pain    Degenerative disk disease    Dyslipidemia    Fibromyalgia    GERD (gastroesophageal reflux disease)    Hiatal hernia    Irritable bowel syndrome (IBS)    Memory disorder 07/18/2014   Memory loss    Migraine headache    Nephrolithiasis  Pneumonia 1986   Serotonin syndrome     Tobacco History: Social History   Tobacco Use  Smoking Status Former   Packs/day: 0.50   Years: 8.00   Pack years: 4.00   Types: Cigarettes   Quit date: 06/09/1978   Years since quitting: 42.6  Smokeless Tobacco Never   Counseling given: Not Answered   Outpatient Medications Prior to Visit  Medication Sig Dispense Refill   albuterol (PROVENTIL HFA;VENTOLIN HFA) 108 (90 BASE) MCG/ACT  inhaler Inhale 2 puffs into the lungs every 6 (six) hours as needed. 1 Inhaler 5   albuterol (PROVENTIL) (2.5 MG/3ML) 0.083% nebulizer solution Take 2.5 mg by nebulization every 6 (six) hours as needed for shortness of breath or wheezing.     Ascorbic Acid (VITAMIN C) 1000 MG tablet Take 1,000 mg by mouth daily.     atorvastatin (LIPITOR) 10 MG tablet Take 10 mg by mouth daily.     budesonide-formoterol (SYMBICORT) 160-4.5 MCG/ACT inhaler TAKE 2 PUFFS BY MOUTH TWICE A DAY 30.6 each 0   buPROPion (WELLBUTRIN XL) 300 MG 24 hr tablet Take 300 mg by mouth every morning.     carbamazepine (TEGRETOL) 100 MG chewable tablet Chew 400 mg by mouth at bedtime.     cetirizine (ZYRTEC) 10 MG tablet Take 10 mg by mouth daily.     Cholecalciferol (VITAMIN D3) 50 MCG (2000 UT) capsule Take 2,000 Units by mouth daily.     Cyanocobalamin (VITAMIN B-12) 2500 MCG SUBL Place 2,500 mcg under the tongue daily.     docusate sodium (COLACE) 50 MG capsule Take 150 mg by mouth 2 (two) times daily.     EPINEPHrine 0.3 mg/0.3 mL IJ SOAJ injection Inject 0.3 mg into the muscle as needed for anaphylaxis.     lamoTRIgine (LAMICTAL) 150 MG tablet Take 300 mg by mouth at bedtime.     meloxicam (MOBIC) 15 MG tablet Take 15 mg by mouth daily.     montelukast (SINGULAIR) 10 MG tablet Take 10 mg by mouth at bedtime.     pantoprazole (PROTONIX) 40 MG tablet Take 40 mg by mouth daily.     zinc gluconate 50 MG tablet Take 50 mg by mouth daily.     valACYclovir (VALTREX) 1000 MG tablet Take 1 g by mouth daily as needed (Cold sore).     No facility-administered medications prior to visit.    Review of Systems  Review of Systems  Constitutional: Negative.   HENT:  Negative for postnasal drip.   Respiratory:  Positive for cough and chest tightness.   Cardiovascular: Negative.     Physical Exam  BP 122/64 (BP Location: Left Arm, Patient Position: Sitting, Cuff Size: Normal)   Pulse 78   Temp 98.2 F (36.8 C) (Oral)   Ht 5'  5" (1.651 m)   Wt 159 lb 3.2 oz (72.2 kg)   SpO2 96% Comment: RA  BMI 26.49 kg/m  Physical Exam Constitutional:      Appearance: Normal appearance.  HENT:     Head: Normocephalic and atraumatic.     Right Ear: Tympanic membrane normal. There is no impacted cerumen.     Left Ear: Tympanic membrane normal. There is no impacted cerumen.     Mouth/Throat:     Mouth: Mucous membranes are moist.     Pharynx: Oropharynx is clear.  Cardiovascular:     Rate and Rhythm: Normal rate and regular rhythm.  Pulmonary:     Effort: Pulmonary effort is normal.  Breath sounds: No wheezing, rhonchi or rales.     Comments: Reactive upper airway cough; Lungs clear on exam  Musculoskeletal:        General: Normal range of motion.     Cervical back: Normal range of motion and neck supple.  Skin:    General: Skin is warm and dry.  Neurological:     General: No focal deficit present.     Mental Status: She is alert and oriented to person, place, and time. Mental status is at baseline.  Psychiatric:        Mood and Affect: Mood normal.        Behavior: Behavior normal.        Thought Content: Thought content normal.        Judgment: Judgment normal.     Lab Results:  CBC    Component Value Date/Time   WBC 7.7 08/19/2016 1228   RBC 4.74 08/19/2016 1228   HGB 15.1 (H) 08/19/2016 1228   HCT 44.4 08/19/2016 1228   PLT 297.0 08/19/2016 1228   MCV 93.7 08/19/2016 1228   MCH 30.7 06/29/2010 1353   MCHC 34.1 08/19/2016 1228   RDW 14.3 08/19/2016 1228   LYMPHSABS 1.5 08/19/2016 1228   MONOABS 0.6 08/19/2016 1228   EOSABS 0.2 08/19/2016 1228   BASOSABS 0.1 08/19/2016 1228    BMET    Component Value Date/Time   NA 136 06/29/2010 1353   K 3.5 06/29/2010 1353   CL 102 06/29/2010 1353   CO2 24 06/29/2010 1353   GLUCOSE 119 (H) 06/29/2010 1353   BUN 14 06/29/2010 1353   CREATININE 0.78 06/29/2010 1353   CALCIUM 9.1 06/29/2010 1353   GFRNONAA >60 06/29/2010 1353   GFRAA  06/29/2010 1353     >60        The eGFR has been calculated using the MDRD equation. This calculation has not been validated in all clinical situations. eGFR's persistently <60 mL/min signify possible Chronic Kidney Disease.    BNP No results found for: BNP  ProBNP No results found for: PROBNP  Imaging: No results found.   Assessment & Plan:   Severe persistent asthma in adult without complication - Patient had recent viral illness 4 weeks ago and develop persistent dry cough and chest tightness. Symptoms are consistent with post viral cough syndrome and uncontrolled asthma. She is compliant with Symbicort 160, Zyrtec, Singulair and Protonix. She is using SABA 3-4 times a day. Currently off Fasenra d/t prescription running out. She received depo-medrol 70m IM x1 in office. Sending in RX prednisone taper 486mx 3 days, 301m 3 days, 66m38m3 days, 10mg75m days. Recommend she use delsym cough syrup q12 hours and chlorpheniramine 4mg q24mhours as needed for cough. We will start process to resume biologic therapy. FU ibn 2-3 months with Dr. Sood. Halford Chessman ElizabMartyn Ehrich/07/2020

## 2021-02-08 NOTE — Telephone Encounter (Signed)
Patient was receiving home Fasenra injection but has been off for appox 1 year. Can you help getting her back on medication?

## 2021-02-13 NOTE — Telephone Encounter (Signed)
Submitted a Prior Authorization request to [facility] for [facility] via CoverMyMeds. Will update once we receive a response.   Key: [id number]

## 2021-02-13 NOTE — Telephone Encounter (Signed)
Called patient to inquire patient preference on mailing vs emailing PAP forms. Pt requested forms be mailed. Verified pts address and will mail forms out today. Provider pt to be signed by Dr. Volanda Napoleon.

## 2021-02-14 NOTE — Telephone Encounter (Signed)
Provider form received, awaiting return of patient form.

## 2021-02-18 ENCOUNTER — Telehealth: Payer: Self-pay | Admitting: Pharmacist

## 2021-02-18 NOTE — Telephone Encounter (Signed)
Okay.  Then can switch to nucala.

## 2021-02-18 NOTE — Telephone Encounter (Signed)
Submitted a Prior Authorization request to [facility] for [place] via CoverMyMeds. Will update once we receive a response.   Key: [id number]

## 2021-02-18 NOTE — Telephone Encounter (Signed)
She has been on fasenra for several years.  Don't understand why she should have to switch.  Please start prior authorization process.

## 2021-02-18 NOTE — Progress Notes (Signed)
Starting [employer]

## 2021-02-18 NOTE — Telephone Encounter (Signed)
Received a fax regarding Prior Authorization from [facility] for [facility]. Authorization has been DENIED because patient must try at least one formulary alternative (try and fail or caused side effects).  Nucala is preferred. - Phone# [phone]  Knox Saliva, PharmD, MPH, BCPS Clinical Pharmacist (Rheumatology and Pulmonology)

## 2021-02-18 NOTE — Telephone Encounter (Signed)
Please start Nucala BIV.  Dose: '100mg'$  every 28 days  Dx: Eosinophilic asthma, (123456) as primary and Severe persistent asthma (J45.50)  Knox Saliva, PharmD, MPH, BCPS Clinical Pharmacist (Rheumatology and Pulmonology)

## 2021-02-18 NOTE — Telephone Encounter (Signed)
Thank you :)

## 2021-02-18 NOTE — Telephone Encounter (Signed)
Will start Nucala BIV encounter

## 2021-02-19 ENCOUNTER — Other Ambulatory Visit (HOSPITAL_COMMUNITY): Payer: Self-pay

## 2021-02-19 NOTE — Telephone Encounter (Signed)
LVM on pt's phone regarding new PAP paperwork being mailed to her home address. Provider portion to be placed in box.

## 2021-02-19 NOTE — Telephone Encounter (Signed)
Received notification from [facility] regarding a prior authorization for [place]. Authorization has been APPROVED from 02/18/2021 to 02/18/2022.   Per test claim, copay for 28 days supply is $949.00, ($376.28 has been attributed to coverage gap).  Patient can fill through [place]: [phone]   Authorization #  [id number]  Per previous conversation, patient would prefer that PAP forms be mailed to her. Will begin gathering up documentation for submission to [employer] [place].

## 2021-02-20 DIAGNOSIS — J45909 Unspecified asthma, uncomplicated: Secondary | ICD-10-CM | POA: Diagnosis not present

## 2021-02-20 DIAGNOSIS — J069 Acute upper respiratory infection, unspecified: Secondary | ICD-10-CM | POA: Diagnosis not present

## 2021-02-20 DIAGNOSIS — Z299 Encounter for prophylactic measures, unspecified: Secondary | ICD-10-CM | POA: Diagnosis not present

## 2021-02-28 NOTE — Telephone Encounter (Signed)
Both PA approval letter and provider portion have been received and placed into "Awaiting Response" folder. Will f/u with patient for status of her application.

## 2021-03-07 NOTE — Telephone Encounter (Signed)
Spoke to patient, she mailed her application back to the office on 03/05/21.

## 2021-03-11 NOTE — Telephone Encounter (Signed)
Submitted Patient Assistance Application to [employer] [place] on 03/08/21 for [place] along with provider portion, patient portion, PA and income documents. Will update patient when we receive a response.  Fax# [phone] Phone# [phone]  Knox Saliva, PharmD, MPH, BCPS Clinical Pharmacist (Rheumatology and Pulmonology)

## 2021-03-15 NOTE — Telephone Encounter (Signed)
Laci from [employer] [place] is calling with the summary of benefits. Laci phone number is [phone].

## 2021-03-19 NOTE — Telephone Encounter (Signed)
Called [employer] [place], the patient's application is in process. Should received determination by tomorrow.

## 2021-03-20 NOTE — Telephone Encounter (Signed)
Received a fax from  [place] [facility] [place] regarding an approval for [place] patient assistance from 03/19/21 to 06/08/21.   Phone number: [phone]  Medication will ship from [place]

## 2021-03-20 NOTE — Telephone Encounter (Signed)
Nucala new start scheduled for 03/25/21. Patient aware of monitoring time.  Sample set aside for patient in [place], PharmD, MPH, BCPS Clinical Pharmacist (Rheumatology and Pulmonology)

## 2021-03-25 ENCOUNTER — Other Ambulatory Visit: Payer: Self-pay

## 2021-03-25 ENCOUNTER — Ambulatory Visit: Payer: PPO | Admitting: Pharmacist

## 2021-03-25 DIAGNOSIS — J4550 Severe persistent asthma, uncomplicated: Secondary | ICD-10-CM

## 2021-03-25 DIAGNOSIS — Z79899 Other long term (current) drug therapy: Secondary | ICD-10-CM

## 2021-03-25 DIAGNOSIS — Z7189 Other specified counseling: Secondary | ICD-10-CM

## 2021-03-25 MED ORDER — NUCALA 100 MG/ML ~~LOC~~ SOAJ: 100.0000 mg | SUBCUTANEOUS | 2 refills | Status: DC

## 2021-03-25 NOTE — Patient Instructions (Signed)
Your next NUCALA dose is due on 04/22/21, 05/20/21, and every 4 weeks thereafter  CONTINUE SYMBICORT as prescribed. CONTINUE montelukast. CONTINUE cetirizine  Your prescription will be shipped from [facility].  Their phone number is [phone]  You will need to be seen by your provider in 3 to 4 months to assess how Nucala is working for you. Please ensure you have a follow-up appointment scheduled in January/February 2023. Call our clinic if you need to make this appointment.  How to manage an injection site reaction: Remember the 5 C's: COUNTER - leave on the counter at least 30 minutes but up to overnight to bring medication to room temperature. This may help prevent stinging COLD - place something cold (like an ice gel pack or cold water bottle) on the injection site just before cleansing with alcohol. This may help reduce pain CLARITIN - use Claritin (generic name is loratadine) for the first two weeks of treatment or the day of, the day before, and the day after injecting. This will help to minimize injection site reactions CORTISONE CREAM - apply if injection site is irritated and itching CALL ME - if injection site reaction is bigger than the size of your fist, looks infected, blisters, or if you develop hives   Anaphylactic Reactions  Anaphylactic reactions can occur up to 24 hours after administration.  What are the signs and symptoms of anaphylaxis?  Symptoms can vary from a mild skin reaction to more severe reactions including: Wheezing, shortness of breath, cough, chest tightness or trouble breathing Low blood pressure, dizziness, fainting, rapid of weak heartbeat, anxiety, or feeling of "impending doom" Flushing, itching, hives, or feeling warm Swelling of the throat or tongue, throat tightness, hoarse voice, or trouble swallowing  Some of these symptoms require immediate treatment, as they can be life threatening.  If you experience severe symptoms  which are bolded above use your Epipen and call 911 for immediate emergency care.

## 2021-03-25 NOTE — Progress Notes (Addendum)
HPI Patient presents today to [facility] to see pharmacy team for [facility] new start.  She was previously on Fasenra autoinjectors but stopped therapy more than one year ago. The plan was to restart on [place] however her insurance denied and required she try and fail Nucala as step therapy. Since she was off of [place] therapy for greater than a year, she would be considered a new start and decision was made after agreement from Dr. Halford Chessman to pursue Nucala with same goals of care, potential adverse effects, and monitoring as Fasenra.  Past medical history includes: persistent asthma, allergic rhinitis, memory disorder.   Respiratory Medications Current regimen: Symbicort 160/4.5 mcg (2 puffs twice daily), montelukast 10mg  nightly, cetirizine daily Tried in past: Fasenra (asthma well controlled but patient discontinued), Xolair (discontinued and restarted multiple times d/t insurance changes and  financial concerns)  OBJECTIVE Allergies  Allergen Reactions   Cephalosporins    Cymbalta [Duloxetine Hcl] Other (See Comments)    serotonin syndrome   Divalproex Sodium Other (See Comments)    unknown   Effexor [Venlafaxine Hydrochloride] Other (See Comments)    serotonin syndrome   Geodon [Ziprasidone Hydrochloride] Other (See Comments)    Hands and feet tingling.   Prozac [Fluoxetine Hcl] Other (See Comments)    suicidal   Theo-Dur [Theophylline] Other (See Comments)    unknown   Thorazine [Chlorpromazine Hcl] Other (See Comments)    unknown   Topamax [Topiramate] Other (See Comments)   Cephalexin Rash    Joint swelling   Codeine Nausea Only    Pt states must take phenergan with codeine.       Morphine Nausea And Vomiting and Nausea Only        Ziprasidone Nausea Only and Other (See Comments)    Hands and feet tingling.    Outpatient Encounter Medications as of 03/25/2021  Medication Sig   albuterol (PROVENTIL HFA;VENTOLIN HFA) 108 (90 BASE) MCG/ACT inhaler Inhale 2 puffs  into the lungs every 6 (six) hours as needed.   albuterol (PROVENTIL) (2.5 MG/3ML) 0.083% nebulizer solution Take 2.5 mg by nebulization every 6 (six) hours as needed for shortness of breath or wheezing.   Ascorbic Acid (VITAMIN C) 1000 MG tablet Take 1,000 mg by mouth daily.   atorvastatin (LIPITOR) 10 MG tablet Take 10 mg by mouth daily.   budesonide-formoterol (SYMBICORT) 160-4.5 MCG/ACT inhaler TAKE 2 PUFFS BY MOUTH TWICE A DAY   buPROPion (WELLBUTRIN XL) 300 MG 24 hr tablet Take 300 mg by mouth every morning.   carbamazepine (TEGRETOL) 100 MG chewable tablet Chew 400 mg by mouth at bedtime.   cetirizine (ZYRTEC) 10 MG tablet Take 10 mg by mouth daily.   Cholecalciferol (VITAMIN D3) 50 MCG (2000 UT) capsule Take 2,000 Units by mouth daily.   Cyanocobalamin (VITAMIN B-12) 2500 MCG SUBL Place 2,500 mcg under the tongue daily.   docusate sodium (COLACE) 50 MG capsule Take 150 mg by mouth 2 (two) times daily.   EPINEPHrine 0.3 mg/0.3 mL IJ SOAJ injection Inject 0.3 mg into the muscle as needed for anaphylaxis.   lamoTRIgine (LAMICTAL) 150 MG tablet Take 300 mg by mouth at bedtime.   meloxicam (MOBIC) 15 MG tablet Take 15 mg by mouth daily.   montelukast (SINGULAIR) 10 MG tablet Take 10 mg by mouth at bedtime.   pantoprazole (PROTONIX) 40 MG tablet Take 40 mg by mouth daily.   predniSONE (DELTASONE) 10 MG tablet Take 4 tabs po daily x 3 days; then 3 tabs daily x3  days; then 2 tabs daily x3 days; then 1 tab daily x 3 days; then stop   zinc gluconate 50 MG tablet Take 50 mg by mouth daily.   No facility-administered encounter medications on file as of 03/25/2021.     Immunization History  Administered Date(s) Administered   Influenza Split 02/19/2011   Influenza Whole 03/09/2012   Influenza,inj,Quad PF,6+ Mos 03/08/2013, 03/01/2014, 02/08/2015, 02/04/2016, 03/03/2017, 01/26/2019   Moderna Sars-Covid-2 Vaccination 12/15/2019   Pneumococcal Polysaccharide-23 02/15/2006     PFTs PFT Results  Latest Ref Rng & Units 01/17/2014  FVC-Pre L 2.15  FVC-Predicted Pre % 61  FVC-Post L 2.94  FVC-Predicted Post % 84  Pre FEV1/FVC % % 61  Post FEV1/FCV % % 78  FEV1-Pre L 1.30  FEV1-Predicted Pre % 46  FEV1-Post L 2.28  DLCO uncorrected ml/min/mmHg 23.48  DLCO UNC% % 102  DLVA Predicted % 116  TLC L 4.61  TLC % Predicted % 94  RV % Predicted % 135     Eosinophils Most recent blood eosinophil count was 200 cells/microL taken on 08/19/16.   IgE: 120 on 08/19/16   Assessment   Biologics training for mepolizumab (Nucala)  Goals of therapy: Mechanism of Action: Not fully understood. It does act an interleukin-5 (IL-5) antagonist monoclonal antibody that reduces the production and survival of eosinophils by blocking the binding of IL-5 to the alpha chain of the receptor complex on the eosinophil cell surface. Reviewed that Nucala is add-on medication and patient must continue maintenance inhaler regimen. Response to therapy: may take 3 months to 6 months to determine efficacy. Discussed that patients generally feel improvement sooner than 3 months.  Side effects: headache (19%), injection site reaction (7-15%), antibody development (6%), backache (5%), fatigue (5%)  Dose: 100 mg subcutaneously every 4 weeks  Administration/Storage:  Reviewed administration sites of thigh or abdomen (at least 2-3 inches away from abdomen). Reviewed the upper arm is only appropriate if caregiver is administering injection  Do not shake the reconstituted solution as this could lead to product foaming or precipitation. Solution should be clear to opalescent and colorless to pale yellow or pale brown, essentially particle free. Small air bubbles, however, are expected and acceptable. If particulate matter remains in the solution or if the solution appears cloudy or milky, discard the solution.  Reviewed storage of medication in refrigerator. Reviewed that Nucala can be stored at room temperature in  unopened carton for up to 7 days.  Access: Approval of Nucala through: patient assistance through 06/08/21. Advised that appliation for 2023 renewal will be mailed to her home by end of November 2022 and she can resubmit documents to company herself or complete forms and drop off to the pharmacy team in the clinic for [place] to submit on her behalf.  Patient self-administered in the right abdomen with Nucala 100mg /mL autoinjector. Monitored for 30 minutes without any issue. No injection site reaction. Patient reported no issues either NDC: [phone] Lot: [id number] Exp: 06/2021  Medication Reconciliation  A drug regimen assessment was performed, including review of allergies, interactions, disease-state management, dosing and immunization history. Medications were reviewed with the patient, including name, instructions, indication, goals of therapy, potential side effects, importance of adherence, and safe use.  Drug interaction(s): none noted  Prednisone removed from med list today  PLAN Continue Nucala 100mg  every 4 weeks. Next dose is due 04/22/21 and every 4 weeks thereafter. Rx sent to: [place] [facility]: [id number]. She is expected to receive first Nucala shipment to  her home tomrrow, 03/25/21 Continue maintenance asthma regimen of: Symbicort 160/4.78mcg (2 puffs twice daily), montelukast 10mg  nightly, cetirizine 10mg  daily  All questions encouraged and answered.  Instructed patient to reach out with any further questions or concerns.  Thank you for allowing pharmacy to participate in this patient's care.  This appointment required 90 minutes of patient care (this includes precharting, chart review, review of results, face-to-face care, etc.).   Knox Saliva, PharmD, MPH, BCPS Clinical Pharmacist (Rheumatology and Pulmonology)

## 2021-04-10 ENCOUNTER — Ambulatory Visit: Payer: PPO | Admitting: Pulmonary Disease

## 2021-05-01 DIAGNOSIS — J069 Acute upper respiratory infection, unspecified: Secondary | ICD-10-CM | POA: Diagnosis not present

## 2021-05-01 DIAGNOSIS — R079 Chest pain, unspecified: Secondary | ICD-10-CM | POA: Diagnosis not present

## 2021-05-01 DIAGNOSIS — M542 Cervicalgia: Secondary | ICD-10-CM | POA: Diagnosis not present

## 2021-05-01 DIAGNOSIS — J101 Influenza due to other identified influenza virus with other respiratory manifestations: Secondary | ICD-10-CM | POA: Diagnosis not present

## 2021-05-01 DIAGNOSIS — Z299 Encounter for prophylactic measures, unspecified: Secondary | ICD-10-CM | POA: Diagnosis not present

## 2021-05-13 DIAGNOSIS — Z6828 Body mass index (BMI) 28.0-28.9, adult: Secondary | ICD-10-CM | POA: Diagnosis not present

## 2021-05-13 DIAGNOSIS — J111 Influenza due to unidentified influenza virus with other respiratory manifestations: Secondary | ICD-10-CM | POA: Diagnosis not present

## 2021-05-13 DIAGNOSIS — J45909 Unspecified asthma, uncomplicated: Secondary | ICD-10-CM | POA: Diagnosis not present

## 2021-05-13 DIAGNOSIS — J040 Acute laryngitis: Secondary | ICD-10-CM | POA: Diagnosis not present

## 2021-05-13 DIAGNOSIS — Z299 Encounter for prophylactic measures, unspecified: Secondary | ICD-10-CM | POA: Diagnosis not present

## 2021-05-14 DIAGNOSIS — R059 Cough, unspecified: Secondary | ICD-10-CM | POA: Diagnosis not present

## 2021-05-15 ENCOUNTER — Telehealth: Payer: Self-pay

## 2021-05-15 DIAGNOSIS — J40 Bronchitis, not specified as acute or chronic: Secondary | ICD-10-CM | POA: Diagnosis not present

## 2021-05-15 DIAGNOSIS — J329 Chronic sinusitis, unspecified: Secondary | ICD-10-CM | POA: Diagnosis not present

## 2021-05-15 DIAGNOSIS — J029 Acute pharyngitis, unspecified: Secondary | ICD-10-CM | POA: Diagnosis not present

## 2021-05-15 DIAGNOSIS — Z299 Encounter for prophylactic measures, unspecified: Secondary | ICD-10-CM | POA: Diagnosis not present

## 2021-05-15 NOTE — Telephone Encounter (Signed)
Received patient portion, placed in "Awaiting Response" folder along with copy of insurance card and allergy list. Provider portion placed in Dr. Juanetta Gosling box for signature.

## 2021-05-16 DIAGNOSIS — Z20822 Contact with and (suspected) exposure to covid-19: Secondary | ICD-10-CM | POA: Diagnosis not present

## 2021-05-24 ENCOUNTER — Encounter (HOSPITAL_COMMUNITY): Payer: Self-pay

## 2021-05-24 NOTE — Therapy (Signed)
[facility] [place], [place], [address] Phone: [phone]   Fax:  [phone]  Patient Details  Name: Nancy [name] MRN: [id number] Date of Birth: May 23, 1966 Referring Provider:  No ref. provider found  Encounter Date: 05/24/2021 PHYSICAL THERAPY DISCHARGE SUMMARY  Visits from Start of Care: 1  Current functional level related to goals / functional outcomes: Presented for evaluation, pt did not return thereafter   Remaining deficits: unknown   Education / Equipment: HEP initiated     Patient agrees to discharge. Patient goals were not met. Patient is being discharged due to not returning since the last visit.   Toniann Fail, PT 05/24/2021, 3:50 PM  [facility] [address] [place], [place], [address] Phone: [phone]   Fax:  [phone]

## 2021-05-30 ENCOUNTER — Other Ambulatory Visit: Payer: Self-pay | Admitting: Pulmonary Disease

## 2021-05-31 NOTE — Telephone Encounter (Signed)
Called and spoke with [facility] in [place], [place] and verbally added 3 refills to patients Symbicort inhaler 160-4.5 MCG/ACT

## 2021-06-11 ENCOUNTER — Telehealth: Payer: Self-pay | Admitting: Pulmonary Disease

## 2021-06-11 NOTE — Telephone Encounter (Signed)
Called and spoke with pt who states she has been having problems for about a month now with coughing.  Pt had a cxr performed 12/6 which came back normal. States she has done about 4 covid tests and stated the last covid test she had done was also a test to check for flu. Stated that each of the tests all came back negative.  Pt stated that she has been put on steroids as well as abx since this all began and said that she gets some relief but that relief does not last that long.  Pt is still doing nucala injections as directed, using her Symbicort inhaler as prescribed as well as taking all other meds as prescribed.  Pt said that she has not really had to use her nebulizer or rescue inhaler.  Pt was scheduled for an appt with Roxan Diesel, NP 1/5 but wants to know if there could be any recommendations for her to try until that appt. Dr. Halford Chessman, please advise.

## 2021-06-11 NOTE — Telephone Encounter (Signed)
Preference is to wait until she has appointment on 06/13/21 to better assess best options for therapy.

## 2021-06-11 NOTE — Telephone Encounter (Signed)
Called and spoke to pt. Informed her of the recs per Dr. Halford Chessman. Pt verbalized understanding and denied any further questions or concerns at this time.

## 2021-06-13 ENCOUNTER — Encounter: Payer: Self-pay | Admitting: Nurse Practitioner

## 2021-06-13 ENCOUNTER — Ambulatory Visit: Payer: PPO | Admitting: Nurse Practitioner

## 2021-06-13 ENCOUNTER — Other Ambulatory Visit: Payer: Self-pay

## 2021-06-13 ENCOUNTER — Ambulatory Visit: Payer: PPO

## 2021-06-13 VITALS — BP 124/76 | HR 70 | Temp 97.9°F | Ht 65.0 in | Wt 171.4 lb

## 2021-06-13 DIAGNOSIS — J301 Allergic rhinitis due to pollen: Secondary | ICD-10-CM

## 2021-06-13 DIAGNOSIS — K219 Gastro-esophageal reflux disease without esophagitis: Secondary | ICD-10-CM

## 2021-06-13 DIAGNOSIS — J418 Mixed simple and mucopurulent chronic bronchitis: Secondary | ICD-10-CM

## 2021-06-13 DIAGNOSIS — J209 Acute bronchitis, unspecified: Secondary | ICD-10-CM | POA: Diagnosis not present

## 2021-06-13 DIAGNOSIS — J4551 Severe persistent asthma with (acute) exacerbation: Secondary | ICD-10-CM | POA: Diagnosis not present

## 2021-06-13 MED ORDER — DOXYCYCLINE HYCLATE 100 MG PO TABS: 100.0000 mg | ORAL_TABLET | Freq: Two times a day (BID) | ORAL | 0 refills | Status: DC

## 2021-06-13 MED ORDER — PREDNISONE 10 MG PO TABS: ORAL_TABLET | ORAL | 0 refills | Status: DC

## 2021-06-13 MED ORDER — BREZTRI AEROSPHERE 160-9-4.8 MCG/ACT IN AERO: 2.0000 | INHALATION_SPRAY | Freq: Two times a day (BID) | RESPIRATORY_TRACT | 3 refills | Status: DC

## 2021-06-13 NOTE — Patient Instructions (Addendum)
Stop Symbicort. Start Breztri inhaler 2 puffs, Twice daily. Use with spacer. Brush tongue and rinse mouth afterwards -Continue Nucala inj as previously scheduled -Continue Singulair 10 mg At bedtime  -Continue Zyrtec 10 mg daily  -Continue protonix 40 mg daily   -Prednisone taper pack. 4 tabs for 3 days, then 3 tabs for 3 days, 2 tabs for 3 days, then 1 tab for 3 days, then stop. Take in AM with food. -Doxycyline 100 mg Twice daily for 7 days. Notify immediately of any rash, itching, hives, or swelling, or seek emergency care. Finish your antibiotics in their entirety. Do not stop just because symptoms improve. Take with food to reduce GI upset. This medication causes photosensitivity - avoid direct sun exposure and wear sunscreen -Mucinex 600 mg Twice daily. Can use the Mucinex DM if the cough syrup you have at home does not have dextromethorphan in it   Pulmonary function testing scheduled today.  CT chest with contrast - someone will call you to schedule this.   Asthma Action Plan in place Rinse mouth after inhaled corticosteroid use.  Avoid triggers, when able.  Exercise encouraged. Notify if worsening symptoms upon exertion.  Notify and seek help if symptoms unrelieved by rescue inhaler.   Follow up in 2 weeks with Dr. Halford Chessman or Alanson Aly. If symptoms do not improve or worsen, please contact office for sooner follow up or seek emergency care.

## 2021-06-13 NOTE — Assessment & Plan Note (Addendum)
Continues to experience frequent flares of bronchitis. Breathing is relatively stable aside from with coughing spells, per pt. Will trial step up in therapy to [facility] Twice daily with spacer to avoid any further upper airway irritation. Prednisone taper pack. Previously tx with azithro; doxycyline 100 mg Twice daily x 7 days ordered today for possible bacterial coinfection given length of symptoms. Mucinex Twice daily for mucolytic therapy. Given length of symptoms and recurrent flares, I believe further imaging is warranted - CT with contrast. Repeat PFTs (last 2015).   Patient Instructions  Stop Symbicort. Start Breztri inhaler 2 puffs, Twice daily. Use with spacer. Brush tongue and rinse mouth afterwards -Continue Nucala inj as previously scheduled -Continue Singulair 10 mg At bedtime  -Continue Zyrtec 10 mg daily  -Continue protonix 40 mg daily   -Prednisone taper pack. 4 tabs for 3 days, then 3 tabs for 3 days, 2 tabs for 3 days, then 1 tab for 3 days, then stop. Take in AM with food. -Doxycyline 100 mg Twice daily for 7 days. Notify immediately of any rash, itching, hives, or swelling, or seek emergency care. Finish your antibiotics in their entirety. Do not stop just because symptoms improve. Take with food to reduce GI upset. This medication causes photosensitivity - avoid direct sun exposure and wear sunscreen -Mucinex 600 mg Twice daily. Can use the Mucinex DM if the cough syrup you have at home does not have dextromethorphan in it   Pulmonary function testing scheduled today.  CT chest with contrast - someone will call you to schedule this.   Asthma Action Plan in place Rinse mouth after inhaled corticosteroid use.  Avoid triggers, when able.  Exercise encouraged. Notify if worsening symptoms upon exertion.  Notify and seek help if symptoms unrelieved by rescue inhaler.   Follow up in 2 weeks with Dr. Halford Chessman or Alanson Aly. If symptoms do not improve or worsen, please contact  office for sooner follow up or seek emergency care.

## 2021-06-13 NOTE — Progress Notes (Signed)
'@Patient'  ID: Nancy [name], female    DOB: [birth year], 56 y.o.   MRN: [id number]  Chief Complaint  Patient presents with   Follow-up    Patient says she's had a bad cough that's been coming and going since last year in august.     Referring provider: Glenda Chroman, MD  HPI: 56 year old female, former smoker (4 pack years) followed for severe persistent asthma, allergic rhinitis.  She is a patient of Dr. Juanetta Gosling and was last seen in office on 02/08/2021.  Past medical history significant for memory disorder, chest pain of uncertain etiology, migraines, GERD, degenerative disc disease, bipolar.  TEST/EVENTS:   02/08/2021: [place] with Volanda Napoleon NP for asthma exacerbation.  Previously maintained on Symbicort, Singulair, Zyrtec and Fasenra.  Noted to have stopped Fasenra injections prior to this visit.  Utilizing [place] 3-4 times a day.  Depo 80 in office and prednisone taper.  Advised restarting biologic therapy with Nucala.  Follow-up 2 to 3 months.  06/13/2021: Today - acute visit Patient presents today for persistent cough. She states that her cough started again the beginning of December, but has been on and off for over the past year. She has been treated numerous times with prednisone, most recently towards the beginning of December. She was also put on a z pack at the time with mild improvement. She reports it is primarily dry and hacking but she does occasionally get up a small amount of clear sputum. She experiencing shortness of breath with coughing spells, but otherwise, it is unchanged. She has had some PND, primarily associated with her paroxysmal cough. She denies wheezing, nasal congestion, reflux symptoms, orthopnea, chest pain or lower extremity swelling. She continues on her Symbicort Twice daily and has been using her albuterol inhaler a few times a day for the cough. She has been using a codeine syrup at home, without much relief. She continues on her Nucala injections, singulair, and zyrtec.  Overall, she is frustrated with her cough.   Allergies  Allergen Reactions   Cephalosporins    Cymbalta [Duloxetine Hcl] Other (See Comments)    serotonin syndrome   Divalproex Sodium Other (See Comments)    unknown   Effexor [Venlafaxine Hydrochloride] Other (See Comments)    serotonin syndrome   Geodon [Ziprasidone Hydrochloride] Other (See Comments)    Hands and feet tingling.   Prozac [Fluoxetine Hcl] Other (See Comments)    suicidal   Theo-Dur [Theophylline] Other (See Comments)    unknown   Thorazine [Chlorpromazine Hcl] Other (See Comments)    unknown   Topamax [Topiramate] Other (See Comments)   Cephalexin Rash    Joint swelling   Codeine Nausea Only    Pt states must take phenergan with codeine.       Morphine Nausea And Vomiting and Nausea Only        Ziprasidone Nausea Only and Other (See Comments)    Hands and feet tingling.    Immunization History  Administered Date(s) Administered   Influenza Inj Mdck Quad Pf 03/01/2018   Influenza Split 02/19/2011   Influenza Whole 03/09/2012   Influenza,inj,Quad PF,6+ Mos 03/08/2013, 03/01/2014, 02/08/2015, 02/04/2016, 03/03/2017, 01/26/2019   Moderna Sars-Covid-2 Vaccination 12/15/2019   Pneumococcal Polysaccharide-23 02/15/2006    Past Medical History:  Diagnosis Date   Allergic rhinitis    Asthma    Benign familial tremor    Bipolar 1 disorder (HCC)    Chronic neck and back pain    Degenerative disk disease    Dyslipidemia  Fibromyalgia    GERD (gastroesophageal reflux disease)    Hiatal hernia    Irritable bowel syndrome (IBS)    Memory disorder 07/18/2014   Memory loss    Migraine headache    Nephrolithiasis    Pneumonia 1986   Serotonin syndrome     Tobacco History: Social History   Tobacco Use  Smoking Status Former   Packs/day: 0.50   Years: 8.00   Pack years: 4.00   Types: Cigarettes   Quit date: 06/09/1978   Years since quitting: 43.0  Smokeless Tobacco Never   Counseling given: Not  Answered   Outpatient Medications Prior to Visit  Medication Sig Dispense Refill   albuterol (PROVENTIL) (2.5 MG/3ML) 0.083% nebulizer solution Take 2.5 mg by nebulization every 6 (six) hours as needed for shortness of breath or wheezing.     Ascorbic Acid (VITAMIN C) 1000 MG tablet Take 1,000 mg by mouth daily.     atorvastatin (LIPITOR) 10 MG tablet Take 10 mg by mouth daily.     buPROPion (WELLBUTRIN XL) 300 MG 24 hr tablet Take 300 mg by mouth every morning.     carbamazepine (TEGRETOL) 100 MG chewable tablet Chew 400 mg by mouth at bedtime.     cetirizine (ZYRTEC) 10 MG tablet Take 10 mg by mouth daily.     Cholecalciferol (VITAMIN D3) 50 MCG (2000 UT) capsule Take 2,000 Units by mouth daily.     Cyanocobalamin (VITAMIN B-12) 2500 MCG SUBL Place 2,500 mcg under the tongue daily.     docusate sodium (COLACE) 50 MG capsule Take 150 mg by mouth 2 (two) times daily.     EPINEPHrine 0.3 mg/0.3 mL IJ SOAJ injection Inject 0.3 mg into the muscle as needed for anaphylaxis.     lamoTRIgine (LAMICTAL) 150 MG tablet Take 300 mg by mouth at bedtime.     meloxicam (MOBIC) 15 MG tablet Take 15 mg by mouth daily.     Mepolizumab (NUCALA) 100 MG/ML SOAJ Inject 1 mL (100 mg total) into the skin every 28 (twenty-eight) days. 3 mL 2   montelukast (SINGULAIR) 10 MG tablet Take 10 mg by mouth at bedtime.     pantoprazole (PROTONIX) 40 MG tablet Take 40 mg by mouth daily.     zinc gluconate 50 MG tablet Take 50 mg by mouth daily.     budesonide-formoterol (SYMBICORT) 160-4.5 MCG/ACT inhaler Take two puffs by mouth twice daily 30.6 g 2   albuterol (PROVENTIL HFA;VENTOLIN HFA) 108 (90 BASE) MCG/ACT inhaler Inhale 2 puffs into the lungs every 6 (six) hours as needed. 1 Inhaler 5   No facility-administered medications prior to visit.     Review of Systems:   Constitutional: No weight loss or gain, night sweats, fevers, chills. +fatigue (related to poor sleep d/t coughing) HEENT: No headaches, difficulty  swallowing, tooth/dental problems, or sore throat. No sneezing, itching, ear ache, nasal congestion, or post nasal drip CV:  +PND. No chest pain, orthopnea, swelling in lower extremities, anasarca, dizziness, palpitations, syncope Resp: +shortness of breath with coughing spells; primarily non-productive cough. No excess mucus or change in color of mucus. No hemoptysis. No wheezing.  No chest wall deformity GI:  No heartburn, indigestion, abdominal pain, nausea, vomiting, diarrhea, change in bowel habits, loss of appetite, bloody stools.  GU: No dysuria, change in color of urine, urgency or frequency.  No flank pain, no hematuria  Skin: No rash, lesions, ulcerations MSK:  No joint pain or swelling.  No decreased range of motion.  No back pain. Neuro: No dizziness or lightheadedness.  Psych: No depression or anxiety. Mood stable.     Physical Exam:  BP 124/76 (BP Location: Left Arm, Patient Position: Sitting, Cuff Size: Normal)    Pulse 70    Temp 97.9 F (36.6 C) (Oral)    Ht '5\' 5"'  (1.651 m)    Wt 171 lb 6.4 oz (77.7 kg)    SpO2 95%    BMI 28.52 kg/m   GEN: Pleasant, interactive, well-nourished; in no acute distress. HEENT:  Normocephalic and atraumatic. EACs patent bilaterally. TM pearly gray with present light reflex bilaterally. PERRLA. Sclera white. Nasal turbinates pink, moist and patent bilaterally. No rhinorrhea present. Oropharynx pink and moist, without exudate or edema. No lesions, ulcerations, or postnasal drip.  NECK:  Supple w/ fair ROM. No JVD present. Normal carotid impulses w/o bruits. Thyroid symmetrical with no goiter or nodules palpated. No lymphadenopathy.   CV: RRR, no m/r/g, no peripheral edema. Pulses intact, +2 bilaterally. No cyanosis, pallor or clubbing. PULMONARY:  Unlabored, regular breathing. Clear bilaterally A&P w/o wheezes/rales/rhonchi. No accessory muscle use. No dullness to percussion. Dry, hacking cough noted during exam GI: BS present and normoactive. Soft,  non-tender to palpation. No organomegaly or masses detected. No CVA tenderness. MSK: No erythema, warmth or tenderness. Cap refil <2 sec all extrem. No deformities or joint swelling noted.  Neuro: A/Ox3. No focal deficits noted.   Skin: Warm, no lesions or rashe Psych: Normal affect and behavior. Judgement and thought content appropriate.     Lab Results:  CBC    Component Value Date/Time   WBC 7.7 08/19/2016 1228   RBC 4.74 08/19/2016 1228   HGB 15.1 (H) 08/19/2016 1228   HCT 44.4 08/19/2016 1228   PLT 297.0 08/19/2016 1228   MCV 93.7 08/19/2016 1228   MCH 30.7 06/29/2010 1353   MCHC 34.1 08/19/2016 1228   RDW 14.3 08/19/2016 1228   LYMPHSABS 1.5 08/19/2016 1228   MONOABS 0.6 08/19/2016 1228   EOSABS 0.2 08/19/2016 1228   BASOSABS 0.1 08/19/2016 1228    BMET    Component Value Date/Time   NA 136 06/29/2010 1353   K 3.5 06/29/2010 1353   CL 102 06/29/2010 1353   CO2 24 06/29/2010 1353   GLUCOSE 119 (H) 06/29/2010 1353   BUN 14 06/29/2010 1353   CREATININE 0.78 06/29/2010 1353   CALCIUM 9.1 06/29/2010 1353   GFRNONAA >60 06/29/2010 1353   GFRAA  06/29/2010 1353    >60        The eGFR has been calculated using the MDRD equation. This calculation has not been validated in all clinical situations. eGFR's persistently <60 mL/min signify possible Chronic Kidney Disease.    BNP No results found for: BNP   Imaging:  No results found.    PFT Results Latest Ref Rng & Units 01/17/2014  FVC-Pre L 2.15  FVC-Predicted Pre % 61  FVC-Post L 2.94  FVC-Predicted Post % 84  Pre FEV1/FVC % % 61  Post FEV1/FCV % % 78  FEV1-Pre L 1.30  FEV1-Predicted Pre % 46  FEV1-Post L 2.28  DLCO uncorrected ml/min/mmHg 23.48  DLCO UNC% % 102  DLVA Predicted % 116  TLC L 4.61  TLC % Predicted % 94  RV % Predicted % 135    Lab Results  Component Value Date   NITRICOXIDE 10 08/19/2016        Assessment & Plan:   Severe persistent asthma with acute bronchitis and  acute exacerbation Continues  to experience frequent flares of bronchitis. Breathing is relatively stable aside from with coughing spells, per pt. Will trial step up in therapy to [facility] Twice daily with spacer to avoid any further upper airway irritation. Prednisone taper pack. Previously tx with azithro; doxycyline 100 mg Twice daily x 7 days ordered today for possible bacterial coinfection given length of symptoms. Mucinex Twice daily for mucolytic therapy. Given length of symptoms and recurrent flares, I believe further imaging is warranted - CT with contrast. Repeat PFTs (last 2015).   Patient Instructions  Stop Symbicort. Start Breztri inhaler 2 puffs, Twice daily. Use with spacer. Brush tongue and rinse mouth afterwards -Continue Nucala inj as previously scheduled -Continue Singulair 10 mg At bedtime  -Continue Zyrtec 10 mg daily  -Continue protonix 40 mg daily   -Prednisone taper pack. 4 tabs for 3 days, then 3 tabs for 3 days, 2 tabs for 3 days, then 1 tab for 3 days, then stop. Take in AM with food. -Doxycyline 100 mg Twice daily for 7 days. Notify immediately of any rash, itching, hives, or swelling, or seek emergency care. Finish your antibiotics in their entirety. Do not stop just because symptoms improve. Take with food to reduce GI upset. This medication causes photosensitivity - avoid direct sun exposure and wear sunscreen -Mucinex 600 mg Twice daily. Can use the Mucinex DM if the cough syrup you have at home does not have dextromethorphan in it   Pulmonary function testing scheduled today.  CT chest with contrast - someone will call you to schedule this.   Asthma Action Plan in place Rinse mouth after inhaled corticosteroid use.  Avoid triggers, when able.  Exercise encouraged. Notify if worsening symptoms upon exertion.  Notify and seek help if symptoms unrelieved by rescue inhaler.   Follow up in 2 weeks with Dr. Halford Chessman or Alanson Aly. If symptoms do not improve or worsen,  please contact office for sooner follow up or seek emergency care.   Allergic rhinitis Continue Zyrtec and Singulair. Symptoms stable per pt.   GERD (gastroesophageal reflux disease) Well-controlled per pt on PPI therapy. Sleeps with HOB elevated, as previously recommended.    Clayton Bibles, NP 06/13/2021  Pt aware and understands NP's role.

## 2021-06-13 NOTE — Assessment & Plan Note (Signed)
Continue Zyrtec and Singulair. Symptoms stable per pt.

## 2021-06-13 NOTE — Addendum Note (Signed)
Addended by: Clayton Bibles on: 06/13/2021 04:53 PM   Modules accepted: Orders

## 2021-06-13 NOTE — Assessment & Plan Note (Signed)
Well-controlled per pt on PPI therapy. Sleeps with HOB elevated, as previously recommended.

## 2021-06-25 NOTE — Telephone Encounter (Signed)
Submitted Patient Assistance Application to [employer] to Nucala for NUCALA along with provider portion, PA and income documents. Will update patient when we receive a response.  Fax# [phone] Phone# [phone] 

## 2021-06-26 DIAGNOSIS — Z87891 Personal history of nicotine dependence: Secondary | ICD-10-CM | POA: Diagnosis not present

## 2021-06-26 DIAGNOSIS — R251 Tremor, unspecified: Secondary | ICD-10-CM | POA: Diagnosis not present

## 2021-06-26 DIAGNOSIS — Z299 Encounter for prophylactic measures, unspecified: Secondary | ICD-10-CM | POA: Diagnosis not present

## 2021-06-26 DIAGNOSIS — F319 Bipolar disorder, unspecified: Secondary | ICD-10-CM | POA: Diagnosis not present

## 2021-06-27 ENCOUNTER — Ambulatory Visit (INDEPENDENT_AMBULATORY_CARE_PROVIDER_SITE_OTHER)
Admission: RE | Admit: 2021-06-27 | Discharge: 2021-06-27 | Disposition: A | Discharge status: Home / Self Care | Payer: PPO | Source: Ambulatory Visit | Attending: Nurse Practitioner | Admitting: Nurse Practitioner

## 2021-06-27 ENCOUNTER — Other Ambulatory Visit: Payer: Self-pay

## 2021-06-27 DIAGNOSIS — J418 Mixed simple and mucopurulent chronic bronchitis: Secondary | ICD-10-CM | POA: Diagnosis not present

## 2021-06-27 DIAGNOSIS — R911 Solitary pulmonary nodule: Secondary | ICD-10-CM | POA: Diagnosis not present

## 2021-06-27 DIAGNOSIS — R053 Chronic cough: Secondary | ICD-10-CM | POA: Diagnosis not present

## 2021-06-27 DIAGNOSIS — R918 Other nonspecific abnormal finding of lung field: Secondary | ICD-10-CM | POA: Diagnosis not present

## 2021-06-27 DIAGNOSIS — R0600 Dyspnea, unspecified: Secondary | ICD-10-CM | POA: Diagnosis not present

## 2021-06-27 MED ORDER — IOHEXOL 300 MG/ML  SOLN
80.0000 mL | Freq: Once | INTRAMUSCULAR | Status: AC | PRN
  Administered 2021-06-27: 80 mL via INTRAVENOUS

## 2021-07-01 ENCOUNTER — Encounter: Payer: Self-pay | Admitting: Adult Health

## 2021-07-01 ENCOUNTER — Encounter (INDEPENDENT_AMBULATORY_CARE_PROVIDER_SITE_OTHER): Payer: PPO | Admitting: Pulmonary Disease

## 2021-07-01 ENCOUNTER — Telehealth (INDEPENDENT_AMBULATORY_CARE_PROVIDER_SITE_OTHER): Payer: PPO | Admitting: Adult Health

## 2021-07-01 ENCOUNTER — Ambulatory Visit: Payer: PPO | Admitting: Nurse Practitioner

## 2021-07-01 ENCOUNTER — Other Ambulatory Visit: Payer: Self-pay

## 2021-07-01 DIAGNOSIS — J4551 Severe persistent asthma with (acute) exacerbation: Secondary | ICD-10-CM | POA: Diagnosis not present

## 2021-07-01 LAB — PULMONARY FUNCTION TEST
DL/VA % pred: 117 %
DL/VA: 4.98 ml/min/mmHg/L
DLCO cor % pred: 107 %
DLCO cor: 23.16 ml/min/mmHg
DLCO unc % pred: 107 %
DLCO unc: 23.16 ml/min/mmHg
FEF 25-75 Post: 2.63 L/sec
FEF 25-75 Pre: 1.58 L/sec
FEF2575-%Change-Post: 66 %
FEF2575-%Pred-Post: 97 %
FEF2575-%Pred-Pre: 58 %
FEV1-%Change-Post: 13 %
FEV1-%Pred-Post: 84 %
FEV1-%Pred-Pre: 74 %
FEV1-Post: 2.4 L
FEV1-Pre: 2.12 L
FEV1FVC-%Change-Post: 8 %
FEV1FVC-%Pred-Pre: 92 %
FEV6-%Change-Post: 4 %
FEV6-%Pred-Post: 85 %
FEV6-%Pred-Pre: 82 %
FEV6-Post: 3 L
FEV6-Pre: 2.88 L
FEV6FVC-%Pred-Post: 102 %
FEV6FVC-%Pred-Pre: 102 %
FVC-%Change-Post: 4 %
FVC-%Pred-Post: 83 %
FVC-%Pred-Pre: 79 %
FVC-Post: 3 L
FVC-Pre: 2.88 L
Post FEV1/FVC ratio: 80 %
Post FEV6/FVC ratio: 100 %
Pre FEV1/FVC ratio: 74 %
Pre FEV6/FVC Ratio: 100 %
RV % pred: 272 %
RV: 5.18 L
TLC % pred: 151 %
TLC: 7.88 L

## 2021-07-01 MED ORDER — BENZONATATE 200 MG PO CAPS: 200.0000 mg | ORAL_CAPSULE | Freq: Three times a day (TID) | ORAL | 1 refills | Status: DC | PRN

## 2021-07-01 NOTE — Progress Notes (Signed)
Virtual Visit via Video Note  I connected with Nancy [name] on 07/01/21 at  4:30 PM EST by a video enabled telemedicine application and verified that I am speaking with the correct person using two identifiers.  Location: Patient: Home  Provider: Office    I discussed the limitations of evaluation and management by telemedicine and the availability of in person appointments. The patient expressed understanding and agreed to proceed.  History of Present Illness: 56 year old female former smoker with allergic asthma with eosinophilic phenotype  Today's video visit is a 2-week follow-up.  Patient had been doing well with her asthma on current regimen with Symbicort Singulair and Nucala.  However she says she got some type of viral illness in the fall with cough and congestion and since then she has had recurrent flare of her asthma requiring several courses of antibiotics and steroids.  She has been checked for influenza and COVID which have all been negative .  Last visit patient was given another course of antibiotics with doxycycline and prednisone.  She was set up for a CT chest that showed linear atelectasis in the lung bases, 5 mm left lower lobe pulmonary nodule.  She was changed from Symbicort to [employer] inhaler.  She remains on Nucala and Singulair. Patient says that she is feeling some better.  She continues to have some ongoing cough.  She has postnasal drainage and nasal stuffiness.  She denies any fever, chest pain, orthopnea.  Says her cough has not resolved. PFTs today showed mild to moderate restriction with reversibility.  FEV1 84%, ratio 80, FVC 83%, positive bronchodilator response, DLCO 107%.   Past Medical History:  Diagnosis Date   Allergic rhinitis    Asthma    Benign familial tremor    Bipolar 1 disorder (HCC)    Chronic neck and back pain    Degenerative disk disease    Dyslipidemia    Fibromyalgia    GERD (gastroesophageal reflux disease)    Hiatal hernia     Irritable bowel syndrome (IBS)    Memory disorder 07/18/2014   Memory loss    Migraine headache    Nephrolithiasis    Pneumonia 1986   Serotonin syndrome    Current Outpatient Medications on File Prior to Visit  Medication Sig Dispense Refill   albuterol (PROVENTIL) (2.5 MG/3ML) 0.083% nebulizer solution Take 2.5 mg by nebulization every 6 (six) hours as needed for shortness of breath or wheezing.     Ascorbic Acid (VITAMIN C) 1000 MG tablet Take 1,000 mg by mouth daily.     atorvastatin (LIPITOR) 10 MG tablet Take 10 mg by mouth daily.     BLACK ELDERBERRY PO Take 1 each by mouth daily at 6 (six) AM. Elderberry gummy     Budeson-Glycopyrrol-Formoterol (BREZTRI AEROSPHERE) 160-9-4.8 MCG/ACT AERO Inhale 2 puffs into the lungs in the morning and at bedtime. 10.7 g 3   buPROPion (WELLBUTRIN XL) 300 MG 24 hr tablet Take 300 mg by mouth every morning.     carbamazepine (TEGRETOL) 100 MG chewable tablet Chew 400 mg by mouth at bedtime.     cetirizine (ZYRTEC) 10 MG tablet Take 10 mg by mouth daily.     Cholecalciferol (VITAMIN D3) 50 MCG (2000 UT) capsule Take 2,000 Units by mouth daily.     Cyanocobalamin (VITAMIN B-12) 2500 MCG SUBL Place 2,500 mcg under the tongue daily.     docusate sodium (COLACE) 50 MG capsule Take 150 mg by mouth 2 (two) times daily.  EPINEPHrine 0.3 mg/0.3 mL IJ SOAJ injection Inject 0.3 mg into the muscle as needed for anaphylaxis.     lamoTRIgine (LAMICTAL) 150 MG tablet Take 300 mg by mouth at bedtime.     meloxicam (MOBIC) 15 MG tablet Take 15 mg by mouth daily.     Mepolizumab (NUCALA) 100 MG/ML SOAJ Inject 1 mL (100 mg total) into the skin every 28 (twenty-eight) days. 3 mL 2   montelukast (SINGULAIR) 10 MG tablet Take 10 mg by mouth at bedtime.     pantoprazole (PROTONIX) 40 MG tablet Take 40 mg by mouth daily.     zinc gluconate 50 MG tablet Take 50 mg by mouth daily.     No current facility-administered medications on file prior to visit.      Observations/Objective:  CXR 11/2018 clear lungs    Spirometry 03/05/10>>FEV1 2.43(87%), FEV1% 93 IgE 2008>>412 IgE 04/01/11>>162.1 Xolair from 11/23/07 to 03/05/10>>stopped due to insurance issues RAST 04/01/11>>Cat dander, dog dander, dust mites PFT 05/28/11>>FEV1 3.01(112%), FEV1% 78, TLC 5.64(112%), RV 2.28(135%), DLCO 99%, positive bronchodilator response. December 2012 >> resumed xolair >> stopped Summer 2014 due to finances >> resumed September 2014 January 2016 >> xolair d/c'ed due to finances FeNO 08/19/16 >> 10 Spirometry 08/19/16 >> FEV1 2.49 (84%), FEV1% 79 Labs 08/19/16 >> IgE 120    Assessment and Plan: Slow to resolve asthmatic bronchitic exacerbation over the last several months.  Questionable etiology. PFTs are consistent with asthma.  CT chest shows minimal bibasilar scarring.  No acute process.  Would begin cough control regimen and trigger prevention to see if this helps.  Continue on triple therapy inhaler.  And biologic with Nucala.  If continues to have ongoing symptoms may need to change Nucala  Lung nodule-very small pulmonary nodule measuring 5 mm in the left lower lobe will need follow-up CT chest in 1 year  Plan  Patient Instructions  CT chest without contrast in 1 year (January 2024) Begin Delsym 2 tsp Twice daily  for cough As needed   Tessalon Three times a day  for cough as needed  Continue on Zyrtec in am .  Begin Chlorpheniramine 4 mg At bedtime  , may make you sleepy .  Continue on Protonix daily  Continue on BREZTRI 2 puffs Twice daily  , rinse after use.  Continue on Nucala .  Follow up with Dr. Halford Chessman  in 6-8 weeks and As needed   Please contact office for sooner follow up if symptoms do not improve or worsen or seek emergency care           Follow Up Instructions:    I discussed the assessment and treatment plan with the patient. The patient was provided an opportunity to ask questions and all were answered. The patient agreed with  the plan and demonstrated an understanding of the instructions.   The patient was advised to call back or seek an in-person evaluation if the symptoms worsen or if the condition fails to improve as anticipated.  I provided 30  minutes of non-face-to-face time during this encounter.   Rexene Edison, NP

## 2021-07-01 NOTE — Patient Instructions (Addendum)
CT chest without contrast in 1 year (January 2024) Begin Delsym 2 tsp Twice daily  for cough As needed   Tessalon Three times a day  for cough as needed  Continue on Zyrtec in am .  Begin Chlorpheniramine 4 mg At bedtime  , may make you sleepy .  Continue on Protonix daily  Continue on BREZTRI 2 puffs Twice daily  , rinse after use.  Continue on Nucala .  Follow up with Dr. Halford Chessman  in 6-8 weeks and As needed   Please contact office for sooner follow up if symptoms do not improve or worsen or seek emergency care

## 2021-07-02 NOTE — Progress Notes (Signed)
Reviewed and agree with assessment/plan.   Chesley Mires, MD [facility] Pulmonary/Critical Care 07/02/2021, 9:07 AM Pager:  [phone]

## 2021-07-10 NOTE — Telephone Encounter (Signed)
Called [facility] [place] to check on patient's application, per rep, benefits have been completed, but patient assistance has not yet been assessed. She will send over to team to review. Turn around time is 24- 48 hours for determination.    Phone# [phone]

## 2021-07-15 ENCOUNTER — Ambulatory Visit: Payer: PPO | Admitting: Orthopedic Surgery

## 2021-07-15 ENCOUNTER — Other Ambulatory Visit: Payer: Self-pay

## 2021-07-15 ENCOUNTER — Encounter: Payer: Self-pay | Admitting: Orthopedic Surgery

## 2021-07-15 ENCOUNTER — Ambulatory Visit: Payer: PPO

## 2021-07-15 VITALS — BP 144/92 | HR 93 | Ht 64.0 in | Wt 171.0 lb

## 2021-07-15 DIAGNOSIS — M25562 Pain in left knee: Secondary | ICD-10-CM

## 2021-07-15 DIAGNOSIS — S83242A Other tear of medial meniscus, current injury, left knee, initial encounter: Secondary | ICD-10-CM | POA: Diagnosis not present

## 2021-07-15 NOTE — Patient Instructions (Addendum)
Dr Lemmie Evens will call results to you 24 hrs after the scan   While we are working on your approval for MRI please go ahead and call to schedule your appointment with [facility] within at least one (1) week.   Central Scheduling [phone]

## 2021-07-15 NOTE — Progress Notes (Signed)
Chief Complaint  Patient presents with   Knee Pain    Left, no injury hurting x 3 months    56 year old female presents with left knee pain for 3 months.  She has pain when she is eating in and out of the car walking through the store.  Pain seems to pierce through the joint mainly on the medial side will radiate laterally.  She has difficulty going up and down the steps  She is on meloxicam 15 mg daily  Physical Exam Constitutional:      General: She is not in acute distress.    Appearance: She is well-developed.     Comments: Well developed, well nourished Normal grooming and hygiene     Cardiovascular:     Comments: No peripheral edema Musculoskeletal:     Left knee: Bony tenderness present. No swelling, deformity, effusion, erythema, ecchymosis, lacerations or crepitus. Normal range of motion. Tenderness present over the medial joint line. Abnormal meniscus. Normal alignment.     Instability Tests: Anterior drawer test negative. Posterior drawer test negative. Medial McMurray test positive.  Skin:    General: Skin is warm and dry.  Neurological:     Mental Status: She is alert and oriented to person, place, and time.     Sensory: No sensory deficit.     Coordination: Coordination normal.     Gait: Gait normal.     Deep Tendon Reflexes: Reflexes are normal and symmetric.  Psychiatric:        Mood and Affect: Mood normal.        Behavior: Behavior normal.        Thought Content: Thought content normal.        Judgment: Judgment normal.     Comments: Affect normal     X-rays are obtained and they show mild narrowing of the medial compartment with normal alignment.  MRI left knee  Encounter Diagnoses  Name Primary?   Acute pain of left knee    Acute medial meniscus tear of left knee, initial encounter Yes

## 2021-07-16 ENCOUNTER — Ambulatory Visit: Payer: PPO | Admitting: Diagnostic Neuroimaging

## 2021-07-16 ENCOUNTER — Encounter: Payer: Self-pay | Admitting: Diagnostic Neuroimaging

## 2021-07-16 VITALS — BP 138/84 | HR 93 | Ht 65.0 in | Wt 174.0 lb

## 2021-07-16 DIAGNOSIS — G250 Essential tremor: Secondary | ICD-10-CM | POA: Diagnosis not present

## 2021-07-16 MED ORDER — PRIMIDONE 50 MG PO TABS: 50.0000 mg | ORAL_TABLET | Freq: Two times a day (BID) | ORAL | 5 refills | Status: DC

## 2021-07-16 NOTE — Progress Notes (Signed)
[facility]  PATIENT: Nancy [name] DOB: April 15, 1966  REFERRING CLINICIAN: Berenice Primas, NP HISTORY FROM: patient  REASON FOR VISIT:  new consult    HISTORICAL  CHIEF COMPLAINT:  Chief Complaint  Patient presents with   New Patient (Initial Visit)    Room 6. Patient being referred for hand tremors. Pt alone. She reports that her tremors have progressed over the last 2 years.    HISTORY OF PRESENT ILLNESS:   56 year old female here for evaluation of tremor.  Has had postural tremor since at least 2015, previously on primidone.  She does not remember taking this medicine in the past but I can see it on her prior neurology evaluation notes from Dr. Tomi Likens and Dr. Jannifer Franklin.   Symptoms worsening in the last few months.  Symptoms affecting her day-to-day activities and quality of life.  No significant triggering or aggravating factors.  No alleviating factors.   REVIEW OF SYSTEMS: Full 14 system review of systems performed and negative with exception of: as per HPI.  ALLERGIES: Allergies  Allergen Reactions   Cephalosporins    Cymbalta [Duloxetine Hcl] Other (See Comments)    serotonin syndrome   Divalproex Sodium Other (See Comments)    unknown   Effexor [Venlafaxine Hydrochloride] Other (See Comments)    serotonin syndrome   Geodon [Ziprasidone Hydrochloride] Other (See Comments)    Hands and feet tingling.   Prozac [Fluoxetine Hcl] Other (See Comments)    suicidal   Theo-Dur [Theophylline] Other (See Comments)    unknown   Thorazine [Chlorpromazine Hcl] Other (See Comments)    unknown   Topamax [Topiramate] Other (See Comments)   Cephalexin Rash    Joint swelling   Codeine Nausea Only    Pt states must take phenergan with codeine.       Morphine Nausea And Vomiting and Nausea Only        Ziprasidone Nausea Only and Other (See Comments)    Hands and feet tingling.    HOME MEDICATIONS: Outpatient Medications Prior to Visit   Medication Sig Dispense Refill   albuterol (PROVENTIL) (2.5 MG/3ML) 0.083% nebulizer solution Take 2.5 mg by nebulization every 6 (six) hours as needed for shortness of breath or wheezing.     Ascorbic Acid (VITAMIN C) 1000 MG tablet Take 1,000 mg by mouth daily.     atorvastatin (LIPITOR) 10 MG tablet Take 10 mg by mouth daily.     benzonatate (TESSALON) 200 MG capsule Take 1 capsule (200 mg total) by mouth 3 (three) times daily as needed for cough. 45 capsule 1   BLACK ELDERBERRY PO Take 1 each by mouth daily at 6 (six) AM. Elderberry gummy     Budeson-Glycopyrrol-Formoterol (BREZTRI AEROSPHERE) 160-9-4.8 MCG/ACT AERO Inhale 2 puffs into the lungs in the morning and at bedtime. 10.7 g 3   buPROPion (WELLBUTRIN XL) 300 MG 24 hr tablet Take 300 mg by mouth every morning.     carbamazepine (TEGRETOL) 100 MG chewable tablet Chew 400 mg by mouth at bedtime.     cetirizine (ZYRTEC) 10 MG tablet Take 10 mg by mouth daily.     Cholecalciferol (VITAMIN D3) 50 MCG (2000 UT) capsule Take 2,000 Units by mouth daily.     Cyanocobalamin (VITAMIN B-12) 2500 MCG SUBL Place 2,500 mcg under the tongue daily.     docusate sodium (COLACE) 50 MG capsule Take 150 mg by mouth 2 (two) times daily.     EPINEPHrine 0.3 mg/0.3 mL IJ SOAJ injection Inject  0.3 mg into the muscle as needed for anaphylaxis.     lamoTRIgine (LAMICTAL) 150 MG tablet Take 300 mg by mouth at bedtime.     meloxicam (MOBIC) 15 MG tablet Take 15 mg by mouth daily.     Mepolizumab (NUCALA) 100 MG/ML SOAJ Inject 1 mL (100 mg total) into the skin every 28 (twenty-eight) days. 3 mL 2   montelukast (SINGULAIR) 10 MG tablet Take 10 mg by mouth at bedtime.     pantoprazole (PROTONIX) 40 MG tablet Take 40 mg by mouth daily.     zinc gluconate 50 MG tablet Take 50 mg by mouth daily.     No facility-administered medications prior to visit.    PAST MEDICAL HISTORY: Past Medical History:  Diagnosis Date   Allergic rhinitis    Asthma    Benign  familial tremor    Bipolar 1 disorder (HCC)    Chronic neck and back pain    Degenerative disk disease    DJD (degenerative joint disease), lumbar    Dyslipidemia    Fibromyalgia    GERD (gastroesophageal reflux disease)    Hiatal hernia    Irritable bowel syndrome (IBS)    Memory disorder 07/18/2014   Memory loss    Migraine headache    Nephrolithiasis    Pneumonia 1986   Serotonin syndrome    Tremor    hands,arms    PAST SURGICAL HISTORY: Past Surgical History:  Procedure Laterality Date   ANKLE FRACTURE SURGERY  2006   Right   APPENDECTOMY     back fusion     BACK SURGERY     BRONCHOSCOPY     S. aureus from BAL   CARDIAC CATHETERIZATION  11/27/2006   minimal CAD   CHOLECYSTECTOMY     COLONOSCOPY WITH PROPOFOL N/A 12/12/2020   Procedure: COLONOSCOPY WITH PROPOFOL;  Surgeon: Rogene Houston, MD;  Location: [facility];  Service: Endoscopy;  Laterality: N/A;  69   ESOPHAGEAL DILATION  08/06/2004   LAPAROSCOPIC CHOLECYSTECTOMY     LASER ABLATION OF THE CERVIX     MICRODISSECTION L5-S1  08/13/2000   neck fusion     TONSILLECTOMY      FAMILY HISTORY: Family History  Problem Relation Age of Onset   Allergies Mother    Clotting disorder Mother    Diabetes Mother    Allergies Father    Heart disease Father    Heart attack Father    Diabetes Father    Allergies Brother    Chorea Maternal Grandmother     SOCIAL HISTORY: Social History   Socioeconomic History   Marital status: Married    Spouse name: Not on file   Number of children: 1   Years of education: HS   Highest education level: Not on file  Occupational History   Occupation: disabled    Employer: UNEMPLOYED  Tobacco Use   Smoking status: Former    Packs/day: 0.50    Years: 8.00    Pack years: 4.00    Types: Cigarettes    Quit date: 06/09/1978    Years since quitting: 43.1   Smokeless tobacco: Never  Vaping Use   Vaping Use: Never used  Substance and Sexual Activity   Alcohol use: No    Drug use: No   Sexual activity: Not on file  Other Topics Concern   Not on file  Social History Narrative   Patient is right handed.   Patient does not drink caffeine.   Social  Determinants of Health   Financial Resource Strain: Not on file  Food Insecurity: Not on file  Transportation Needs: Not on file  Physical Activity: Not on file  Stress: Not on file  Social Connections: Not on file  Intimate Partner Violence: Not on file     PHYSICAL EXAM  GENERAL EXAM/CONSTITUTIONAL: Vitals:  Vitals:   07/16/21 1403  BP: 138/84  Pulse: 93  Weight: 174 lb (78.9 kg)  Height: '5\' 5"'  (1.651 m)   Body mass index is 28.96 kg/m. Wt Readings from Last 3 Encounters:  07/16/21 174 lb (78.9 kg)  07/15/21 171 lb (77.6 kg)  06/13/21 171 lb 6.4 oz (77.7 kg)   Patient is in no distress; well developed, nourished and groomed; neck is supple  CARDIOVASCULAR: Examination of carotid arteries is normal; no carotid bruits Regular rate and rhythm, no murmurs Examination of peripheral vascular system by observation and palpation is normal  EYES: Ophthalmoscopic exam of optic discs and posterior segments is normal; no papilledema or hemorrhages No results found.  MUSCULOSKELETAL: Gait, strength, tone, movements noted in Neurologic exam below  NEUROLOGIC: MENTAL STATUS:  MMSE - Mini Mental State Exam 03/21/2015 07/18/2014  Orientation to time 5 5  Orientation to Place 4 4  Registration 3 3  Attention/ Calculation 5 5  Recall 2 2  Language- name 2 objects 2 2  Language- repeat 1 1  Language- follow 3 step command 3 3  Language- read & follow direction 1 1  Write a sentence 1 1  Copy design 1 1  Total score 28 28   awake, alert, oriented to person, place and time recent and remote memory intact normal attention and concentration language fluent, comprehension intact, naming intact fund of knowledge appropriate  CRANIAL NERVE:  2nd - no papilledema on fundoscopic exam 2nd, 3rd,  4th, 6th - pupils equal and reactive to light, visual fields full to confrontation, extraocular muscles intact, no nystagmus 5th - facial sensation symmetric 7th - facial strength symmetric 8th - hearing intact 9th - palate elevates symmetrically, uvula midline 11th - shoulder shrug symmetric 12th - tongue protrusion midline  MOTOR:  normal bulk and tone, full strength in the BUE, BLE POSTURAL TREMOR IN BUE NO RIGIDITY NO BRADYKINESIA  SENSORY:  normal and symmetric to light touch, temperature, vibration  COORDINATION:  finger-nose-finger, fine finger movements normal  REFLEXES:  deep tendon reflexes TRACE and symmetric  GAIT/STATION:  narrow based gait; romberg is negative     DIAGNOSTIC DATA (LABS, IMAGING, TESTING) - I reviewed patient records, labs, notes, testing and imaging myself where available.  Lab Results  Component Value Date   WBC 7.7 08/19/2016   HGB 15.1 (H) 08/19/2016   HCT 44.4 08/19/2016   MCV 93.7 08/19/2016   PLT 297.0 08/19/2016      Component Value Date/Time   NA 136 06/29/2010 1353   K 3.5 06/29/2010 1353   CL 102 06/29/2010 1353   CO2 24 06/29/2010 1353   GLUCOSE 119 (H) 06/29/2010 1353   BUN 14 06/29/2010 1353   CREATININE 0.78 06/29/2010 1353   CALCIUM 9.1 06/29/2010 1353   PROT 7.0 06/29/2010 1353   ALBUMIN 4.1 06/29/2010 1353   AST 27 06/29/2010 1353   ALT 24 06/29/2010 1353   ALKPHOS 68 06/29/2010 1353   BILITOT 0.4 06/29/2010 1353   GFRNONAA >60 06/29/2010 1353   GFRAA  06/29/2010 1353    >60        The eGFR has been calculated using the MDRD  equation. This calculation has not been validated in all clinical situations. eGFR's persistently <60 mL/min signify possible Chronic Kidney Disease.   No results found for: CHOL, HDL, LDLCALC, LDLDIRECT, TRIG, CHOLHDL Lab Results  Component Value Date   HGBA1C (H) 11/28/2006    6.7 (NOTE)   The ADA recommends the following therapeutic goals for glycemic   control related to  Hgb A1C measurement:   Goal of Therapy:   < 7.0% Hgb A1C   Action Suggested:  > 8.0% Hgb A1C   Ref:  Diabetes Care, 22, Suppl. 1, 1999   Lab Results  Component Value Date   SAYTKZSW10 932 06/14/2013   No results found for: TSH   05/12/14 MRI brain - unremarkable   ASSESSMENT AND PLAN  56 y.o. year old female here with postural tremor, with family history of tremor in her mother, most consistent with essential tremor.   Dx:  1. Essential tremor      PLAN:  ESSENTIAL TREMOR - primidone 42m at bedtime; then increase to twice a day  - may consider gabapentin, topiramate or deep brain stimulator in future  Meds ordered this encounter  Medications   primidone (MYSOLINE) 50 MG tablet    Sig: Take 1 tablet (50 mg total) by mouth in the morning and at bedtime.    Dispense:  60 tablet    Refill:  5   Return in about 6 months (around 01/13/2022) for MyChart visit (15 min).    VPenni Bombard MD 23/10/5730 32:02PM Certified in Neurology, Neurophysiology and Neuroimaging  [facility] [address] [place] [address]([phone]

## 2021-07-16 NOTE — Patient Instructions (Signed)
°-   primidone 50mg  at bedtime; then increase to twice a day

## 2021-07-17 DIAGNOSIS — Z20822 Contact with and (suspected) exposure to covid-19: Secondary | ICD-10-CM | POA: Diagnosis not present

## 2021-07-29 ENCOUNTER — Ambulatory Visit (HOSPITAL_COMMUNITY)
Admission: RE | Admit: 2021-07-29 | Discharge: 2021-07-29 | Disposition: A | Discharge status: Home / Self Care | Payer: PPO | Source: Ambulatory Visit | Attending: Orthopedic Surgery | Admitting: Orthopedic Surgery

## 2021-07-29 ENCOUNTER — Other Ambulatory Visit: Payer: Self-pay

## 2021-07-29 DIAGNOSIS — M25562 Pain in left knee: Secondary | ICD-10-CM | POA: Insufficient documentation

## 2021-07-29 DIAGNOSIS — S83242A Other tear of medial meniscus, current injury, left knee, initial encounter: Secondary | ICD-10-CM | POA: Diagnosis not present

## 2021-07-31 ENCOUNTER — Other Ambulatory Visit: Payer: Self-pay | Admitting: Orthopedic Surgery

## 2021-07-31 ENCOUNTER — Telehealth: Payer: Self-pay | Admitting: Radiology

## 2021-07-31 DIAGNOSIS — Z01818 Encounter for other preprocedural examination: Secondary | ICD-10-CM

## 2021-07-31 DIAGNOSIS — S83242A Other tear of medial meniscus, current injury, left knee, initial encounter: Secondary | ICD-10-CM

## 2021-07-31 NOTE — Telephone Encounter (Signed)
-----   Message from Carole Civil, MD sent at 07/31/2021  1:36 PM EST ----- Schedule left knee mm march 3rd Friday   (New sched doesn't start until april

## 2021-07-31 NOTE — Telephone Encounter (Signed)
Yes, but please clarify ON March 3rd which is a Friday, or the 3rd Friday of March which would be March 17th?

## 2021-08-02 NOTE — Patient Instructions (Signed)
Nancy [name]  08/02/2021     @PREFPERIOPPHARMACY @   Your procedure is scheduled on  08/09/2021.   Report to [facility] at  0600 A.M.   Call this number if you have problems the morning of surgery:  [phone]   Remember:  Do not eat or drink after midnight.      Use your nebulizer before you come and bring your rescue inhaler with you.      Take these medicines the morning of surgery with A SIP OF WATER            wellbutrin, zyrtec, mobic(if needed), protonix.     Do not wear jewelry, make-up or nail polish.  Do not wear lotions, powders, or perfumes, or deodorant.  Do not shave 48 hours prior to surgery.  Men may shave face and neck.  Do not bring valuables to the hospital.  [facility] is not responsible for any belongings or valuables.  Contacts, dentures or bridgework may not be worn into surgery.  Leave your suitcase in the car.  After surgery it may be brought to your room.  For patients admitted to the hospital, discharge time will be determined by your treatment team.  Patients discharged the day of surgery will not be allowed to drive home and must have someone with them for 24 hours.    Special instructions:   DO NOT smoke tobacco or vape for 24 hours before your procedure.  Please read over the following fact sheets that you were given. Coughing and Deep Breathing, Surgical Site Infection Prevention, Anesthesia Post-op Instructions, and Care and Recovery After Surgery      Arthroscopic Knee Ligament Repair, Care After This sheet gives you information about how to care for yourself after your procedure. Your health care provider may also give you more specific instructions. If you have problems or questions, contact your health care provider. What can I expect after the procedure? After the procedure, it is common to have: Soreness or pain in your knee. Bruising and swelling on your knee, calf, and ankle for 3-4 days. A small amount of  fluid coming from the incisions. Follow these instructions at home: Medicines Take over-the-counter and prescription medicines only as told by your health care provider. Ask your health care provider if the medicine prescribed to you: Requires you to avoid driving or using machinery. Can cause constipation. You may need to take these actions to prevent or treat constipation: Drink enough fluid to keep your urine pale yellow. Take over-the-counter or prescription medicines. Eat foods that are high in fiber, such as beans, whole grains, and fresh fruits and vegetables. Limit foods that are high in fat and processed sugars, such as fried or sweet foods. If you have a brace or immobilizer: Wear it as told by your health care provider. Remove it only as told by your health care provider. Loosen it if your toes tingle, become numb, or turn cold and blue. Keep it clean and dry. Ask your health care provider when it is safe to drive. Bathing Do not take baths, swim, or use a hot tub until your health care provider approves. Keep your bandage (dressing) dry until your health care provider says that it can be removed. If the brace or immobilizer is not waterproof: Do not let it get wet. Cover it with a watertight covering when you take a bath or shower. Incision care  Follow instructions from your health care  provider about how to take care of your incisions. Make sure you: Wash your hands with soap and water for at least 20 seconds before and after you change your dressing. If soap and water are not available, use hand sanitizer. Change your dressing as told by your health care provider. Leave stitches (sutures), skin glue, or adhesive strips in place. These skin closures may need to stay in place for 2 weeks or longer. If adhesive strip edges start to loosen and curl up, you may trim the loose edges. Do not remove adhesive strips completely unless your health care provider tells you to do  that. Check your incision areas every day for signs of infection. Check for: Redness. More swelling or pain. Blood or more fluid. Warmth. Pus or a bad smell. Managing pain, stiffness, and swelling  If directed, put ice on the affected area. To do this: If you have a removable brace or immobilizer, remove it as told by your health care provider. Put ice in a plastic bag. Place a towel between your skin and the bag. Leave the ice on for 20 minutes, 2-3 times a day. Remove the ice if your skin turns bright red. This is very important. If you cannot feel pain, heat, or cold, you have a greater risk of damage to the area. Move your toes often to reduce stiffness and swelling. Raise (elevate) the injured area above the level of your heart while you are sitting or lying down. Activity Do not use your knee to support your body weight until your health care provider says that you can. Use crutches or other devices as told by your health care provider. Do physical therapy exercises as told by your health care provider. Physical therapy will help you regain movement and strength in your knee. Follow instructions from your health care provider about: When you may start motion exercises. When you may start riding a stationary bike and doing other low-impact activities. When you may start to jog and do other high-impact activities. Do not lift anything that is heavier than 10 lb (4.5 kg), or the limit that you are told, until your health care provider says that it is safe. Ask your health care provider what activities are safe for you. General instructions Do not use any products that contain nicotine or tobacco, such as cigarettes, e-cigarettes, and chewing tobacco. These can delay healing. If you need help quitting, ask your health care provider. Wear compression stockings as told by your health care provider. These stockings help to prevent blood clots and reduce swelling in your legs. Keep all  follow-up visits. This is important. Contact a health care provider if: You have any of these signs of infection: Redness around an incision. Blood or more fluid coming from an incision. Warmth coming from an incision. Pus or a bad smell coming from an incision. More swelling or pain in your knee. A fever or chills. You have pain that does not get better with medicine. Your incision opens up. Get help right away if: You have trouble breathing. You have chest pain. You have increased pain or swelling in your calf or at the back of your knee. You have numbness and tingling near the knee joint or in the foot, ankle, or toes. You notice that your foot or toes look darker than normal or are cooler than normal. These symptoms may represent a serious problem that is an emergency. Do not wait to see if the symptoms will go away. Get medical  help right away. Call your local emergency services (911 in the [place] Do not drive yourself to the hospital. Summary After the procedure, it is common to have knee pain with bruising and swelling on your knee, calf, and ankle. Icing your knee and raising your leg above the level of your heart will help control the pain and swelling. Do physical therapy exercises as told by your health care provider. Physical therapy will help you regain movement and strength in your knee. This information is not intended to replace advice given to you by your health care provider. Make sure you discuss any questions you have with your health care provider. Document Revised: 10/24/2019 Document Reviewed: 10/24/2019 Elsevier Patient Education  [employer] Anesthesia, Adult, Care After This sheet gives you information about how to care for yourself after your procedure. Your health care provider may also give you more specific instructions. If you have problems or questions, contact your health care provider. What can I expect after the procedure? After the  procedure, the following side effects are common: Pain or discomfort at the IV site. Nausea. Vomiting. Sore throat. Trouble concentrating. Feeling cold or chills. Feeling weak or tired. Sleepiness and fatigue. Soreness and body aches. These side effects can affect parts of the body that were not involved in surgery. Follow these instructions at home: For the time period you were told by your health care provider:  Rest. Do not participate in activities where you could fall or become injured. Do not drive or use machinery. Do not drink alcohol. Do not take sleeping pills or medicines that cause drowsiness. Do not make important decisions or sign legal documents. Do not take care of children on your own. Eating and drinking Follow any instructions from your health care provider about eating or drinking restrictions. When you feel hungry, start by eating small amounts of foods that are soft and easy to digest (bland), such as toast. Gradually return to your regular diet. Drink enough fluid to keep your urine pale yellow. If you vomit, rehydrate by drinking water, juice, or clear broth. General instructions If you have sleep apnea, surgery and certain medicines can increase your risk for breathing problems. Follow instructions from your health care provider about wearing your sleep device: Anytime you are sleeping, including during daytime naps. While taking prescription pain medicines, sleeping medicines, or medicines that make you drowsy. Have a responsible adult stay with you for the time you are told. It is important to have someone help care for you until you are awake and alert. Return to your normal activities as told by your health care provider. Ask your health care provider what activities are safe for you. Take over-the-counter and prescription medicines only as told by your health care provider. If you smoke, do not smoke without supervision. Keep all follow-up visits as told  by your health care provider. This is important. Contact a health care provider if: You have nausea or vomiting that does not get better with medicine. You cannot eat or drink without vomiting. You have pain that does not get better with medicine. You are unable to pass urine. You develop a skin rash. You have a fever. You have redness around your IV site that gets worse. Get help right away if: You have difficulty breathing. You have chest pain. You have blood in your urine or stool, or you vomit blood. Summary After the procedure, it is common to have a sore throat or nausea. It is also  common to feel tired. Have a responsible adult stay with you for the time you are told. It is important to have someone help care for you until you are awake and alert. When you feel hungry, start by eating small amounts of foods that are soft and easy to digest (bland), such as toast. Gradually return to your regular diet. Drink enough fluid to keep your urine pale yellow. Return to your normal activities as told by your health care provider. Ask your health care provider what activities are safe for you. This information is not intended to replace advice given to you by your health care provider. Make sure you discuss any questions you have with your health care provider. Document Revised: 02/09/2020 Document Reviewed: 09/08/2019 Elsevier Patient Education  2022 [employer] [place]. How to Use Chlorhexidine for Bathing Chlorhexidine gluconate (CHG) is a germ-killing (antiseptic) solution that is used to clean the skin. It can get rid of the bacteria that normally live on the skin and can keep them away for about 24 hours. To clean your skin with CHG, you may be given: A CHG solution to use in the shower or as part of a sponge bath. A prepackaged cloth that contains CHG. Cleaning your skin with CHG may help lower the risk for infection: While you are staying in the intensive care unit of the hospital. If you  have a vascular access, such as a central line, to provide short-term or long-term access to your veins. If you have a catheter to drain urine from your bladder. If you are on a ventilator. A ventilator is a machine that helps you breathe by moving air in and out of your lungs. After surgery. What are the risks? Risks of using CHG include: A skin reaction. Hearing loss, if CHG gets in your ears and you have a perforated eardrum. Eye injury, if CHG gets in your eyes and is not rinsed out. The CHG product catching fire. Make sure that you avoid smoking and flames after applying CHG to your skin. Do not use CHG: If you have a chlorhexidine allergy or have previously reacted to chlorhexidine. On babies younger than 70 months of age. How to use CHG solution Use CHG only as told by your health care provider, and follow the instructions on the label. Use the full amount of CHG as directed. Usually, this is one bottle. During a shower Follow these steps when using CHG solution during a shower (unless your health care provider gives you different instructions): Start the shower. Use your normal soap and shampoo to wash your face and hair. Turn off the shower or move out of the shower stream. Pour the CHG onto a clean washcloth. Do not use any type of brush or rough-edged sponge. Starting at your neck, lather your body down to your toes. Make sure you follow these instructions: If you will be having surgery, pay special attention to the part of your body where you will be having surgery. Scrub this area for at least 1 minute. Do not use CHG on your head or face. If the solution gets into your ears or eyes, rinse them well with water. Avoid your genital area. Avoid any areas of skin that have broken skin, cuts, or scrapes. Scrub your back and under your arms. Make sure to wash skin folds. Let the lather sit on your skin for 1-2 minutes or as long as told by your health care provider. Thoroughly  rinse your entire body in the shower.  Make sure that all body creases and crevices are rinsed well. Dry off with a clean towel. Do not put any substances on your body afterward--such as powder, lotion, or perfume--unless you are told to do so by your health care provider. Only use lotions that are recommended by the manufacturer. Put on clean clothes or pajamas. If it is the night before your surgery, sleep in clean sheets.  During a sponge bath Follow these steps when using CHG solution during a sponge bath (unless your health care provider gives you different instructions): Use your normal soap and shampoo to wash your face and hair. Pour the CHG onto a clean washcloth. Starting at your neck, lather your body down to your toes. Make sure you follow these instructions: If you will be having surgery, pay special attention to the part of your body where you will be having surgery. Scrub this area for at least 1 minute. Do not use CHG on your head or face. If the solution gets into your ears or eyes, rinse them well with water. Avoid your genital area. Avoid any areas of skin that have broken skin, cuts, or scrapes. Scrub your back and under your arms. Make sure to wash skin folds. Let the lather sit on your skin for 1-2 minutes or as long as told by your health care provider. Using a different clean, wet washcloth, thoroughly rinse your entire body. Make sure that all body creases and crevices are rinsed well. Dry off with a clean towel. Do not put any substances on your body afterward--such as powder, lotion, or perfume--unless you are told to do so by your health care provider. Only use lotions that are recommended by the manufacturer. Put on clean clothes or pajamas. If it is the night before your surgery, sleep in clean sheets. How to use CHG prepackaged cloths Only use CHG cloths as told by your health care provider, and follow the instructions on the label. Use the CHG cloth on clean, dry  skin. Do not use the CHG cloth on your head or face unless your health care provider tells you to. When washing with the CHG cloth: Avoid your genital area. Avoid any areas of skin that have broken skin, cuts, or scrapes. Before surgery Follow these steps when using a CHG cloth to clean before surgery (unless your health care provider gives you different instructions): Using the CHG cloth, vigorously scrub the part of your body where you will be having surgery. Scrub using a back-and-forth motion for 3 minutes. The area on your body should be completely wet with CHG when you are done scrubbing. Do not rinse. Discard the cloth and let the area air-dry. Do not put any substances on the area afterward, such as powder, lotion, or perfume. Put on clean clothes or pajamas. If it is the night before your surgery, sleep in clean sheets.  For general bathing Follow these steps when using CHG cloths for general bathing (unless your health care provider gives you different instructions). Use a separate CHG cloth for each area of your body. Make sure you wash between any folds of skin and between your fingers and toes. Wash your body in the following order, switching to a new cloth after each step: The front of your neck, shoulders, and chest. Both of your arms, under your arms, and your hands. Your stomach and groin area, avoiding the genitals. Your right leg and foot. Your left leg and foot. The back of your neck, your back,  and your buttocks. Do not rinse. Discard the cloth and let the area air-dry. Do not put any substances on your body afterward--such as powder, lotion, or perfume--unless you are told to do so by your health care provider. Only use lotions that are recommended by the manufacturer. Put on clean clothes or pajamas. Contact a health care provider if: Your skin gets irritated after scrubbing. You have questions about using your solution or cloth. You swallow any chlorhexidine. Call  your local poison control center (1-(312)585-8855 in the [place] Get help right away if: Your eyes itch badly, or they become very red or swollen. Your skin itches badly and is red or swollen. Your hearing changes. You have trouble seeing. You have swelling or tingling in your mouth or throat. You have trouble breathing. These symptoms may represent a serious problem that is an emergency. Do not wait to see if the symptoms will go away. Get medical help right away. Call your local emergency services (911 in the [place] Do not drive yourself to the hospital. Summary Chlorhexidine gluconate (CHG) is a germ-killing (antiseptic) solution that is used to clean the skin. Cleaning your skin with CHG may help to lower your risk for infection. You may be given CHG to use for bathing. It may be in a bottle or in a prepackaged cloth to use on your skin. Carefully follow your health care provider's instructions and the instructions on the product label. Do not use CHG if you have a chlorhexidine allergy. Contact your health care provider if your skin gets irritated after scrubbing. This information is not intended to replace advice given to you by your health care provider. Make sure you discuss any questions you have with your health care provider. Document Revised: 08/06/2020 Document Reviewed: 08/06/2020 Elsevier Patient Education  2022 [employer].

## 2021-08-05 NOTE — Addendum Note (Signed)
Addended byCandice Camp on: 08/05/2021 08:09 AM   Modules accepted: Orders

## 2021-08-06 ENCOUNTER — Encounter (HOSPITAL_COMMUNITY): Payer: Self-pay

## 2021-08-06 ENCOUNTER — Encounter (HOSPITAL_COMMUNITY)
Admission: RE | Admit: 2021-08-06 | Discharge: 2021-08-06 | Disposition: A | Discharge status: Home / Self Care | Payer: PPO | Source: Ambulatory Visit | Attending: Orthopedic Surgery | Admitting: Orthopedic Surgery

## 2021-08-06 ENCOUNTER — Other Ambulatory Visit: Payer: Self-pay

## 2021-08-06 VITALS — BP 122/78 | HR 87 | Temp 97.7°F | Resp 18 | Ht 65.0 in | Wt 174.0 lb

## 2021-08-06 DIAGNOSIS — S83242A Other tear of medial meniscus, current injury, left knee, initial encounter: Secondary | ICD-10-CM

## 2021-08-06 DIAGNOSIS — Z01818 Encounter for other preprocedural examination: Secondary | ICD-10-CM

## 2021-08-06 DIAGNOSIS — Z01812 Encounter for preprocedural laboratory examination: Secondary | ICD-10-CM | POA: Insufficient documentation

## 2021-08-06 DIAGNOSIS — X58XXXA Exposure to other specified factors, initial encounter: Secondary | ICD-10-CM | POA: Diagnosis not present

## 2021-08-06 HISTORY — DX: Personal history of urinary calculi: Z87.442

## 2021-08-06 LAB — CBC WITH DIFFERENTIAL/PLATELET
Abs Immature Granulocytes: 0.01 10*3/uL (ref 0.00–0.07)
Basophils Absolute: 0 10*3/uL (ref 0.0–0.1)
Basophils Relative: 1 %
Eosinophils Absolute: 0 10*3/uL (ref 0.0–0.5)
Eosinophils Relative: 1 %
HCT: 42.5 % (ref 36.0–46.0)
Hemoglobin: 14.2 g/dL (ref 12.0–15.0)
Immature Granulocytes: 0 %
Lymphocytes Relative: 21 %
Lymphs Abs: 1.2 10*3/uL (ref 0.7–4.0)
MCH: 31.3 pg (ref 26.0–34.0)
MCHC: 33.4 g/dL (ref 30.0–36.0)
MCV: 93.8 fL (ref 80.0–100.0)
Monocytes Absolute: 0.5 10*3/uL (ref 0.1–1.0)
Monocytes Relative: 8 %
Neutro Abs: 4 10*3/uL (ref 1.7–7.7)
Neutrophils Relative %: 69 %
Platelets: 285 10*3/uL (ref 150–400)
RBC: 4.53 MIL/uL (ref 3.87–5.11)
RDW: 13.2 % (ref 11.5–15.5)
WBC: 5.7 10*3/uL (ref 4.0–10.5)
nRBC: 0 % (ref 0.0–0.2)

## 2021-08-06 LAB — BASIC METABOLIC PANEL
Anion gap: 9 (ref 5–15)
BUN: 25 mg/dL — ABNORMAL HIGH (ref 6–20)
CO2: 23 mmol/L (ref 22–32)
Calcium: 9.2 mg/dL (ref 8.9–10.3)
Chloride: 105 mmol/L (ref 98–111)
Creatinine, Ser: 0.74 mg/dL (ref 0.44–1.00)
GFR, Estimated: >60 mL/min (ref >60)
Glucose, Bld: 124 mg/dL — ABNORMAL HIGH (ref 70–99)
Potassium: 3.8 mmol/L (ref 3.5–5.1)
Sodium: 137 mmol/L (ref 135–145)

## 2021-08-06 LAB — HCG, SERUM, QUALITATIVE: Preg, Serum: NEGATIVE

## 2021-08-08 NOTE — H&P (Signed)
?Chief Complaint  ?Patient presents with  ? Knee Pain  ?    Left, no injury hurting x 3 months  ?  ?  ?56 year old female presents with left knee pain for 3 months.  She has pain when she is eating in and out of the car walking through the store.  Pain seems to pierce through the joint mainly on the medial side will radiate laterally.  She has difficulty going up and down the steps ? ?She is on meloxicam 15 mg daily ? ?Past Medical History:  ?Diagnosis Date  ? Allergic rhinitis   ? Asthma   ? Benign familial tremor   ? Bipolar 1 disorder ([place])   ? Chronic neck and back pain   ? Degenerative disk disease   ? DJD (degenerative joint disease), lumbar   ? Dyslipidemia   ? Fibromyalgia   ? GERD (gastroesophageal reflux disease)   ? Hiatal hernia   ? History of kidney stones   ? Irritable bowel syndrome (IBS)   ? Memory disorder 07/18/2014  ? Memory loss   ? Migraine headache   ? Nephrolithiasis   ? Pneumonia 1986  ? Serotonin syndrome   ? Tremor   ? hands,arms  ? ?Past Surgical History:  ?Procedure Laterality Date  ? ANKLE FRACTURE SURGERY  2006  ? Right  ? APPENDECTOMY    ? back fusion    ? BACK SURGERY    ? BRONCHOSCOPY    ? S. aureus from BAL  ? CARDIAC CATHETERIZATION  11/27/2006  ? minimal CAD  ? CHOLECYSTECTOMY    ? COLONOSCOPY WITH PROPOFOL N/A 12/12/2020  ? Procedure: COLONOSCOPY WITH PROPOFOL;  Surgeon: Rogene Houston, MD;  Location: [facility];  Service: Endoscopy;  Laterality: N/A;  820  ? ESOPHAGEAL DILATION  08/06/2004  ? LAPAROSCOPIC CHOLECYSTECTOMY    ? LASER ABLATION OF THE CERVIX    ? MICRODISSECTION L5-S1  08/13/2000  ? neck fusion    ? TONSILLECTOMY    ? ?Family History  ?Problem Relation Age of Onset  ? Allergies Mother   ? Clotting disorder Mother   ? Diabetes Mother   ? Allergies Father   ? Heart disease Father   ? Heart attack Father   ? Diabetes Father   ? Allergies Brother   ? [place] Maternal Grandmother   ? ?Social History  ? ?Tobacco Use  ? Smoking status: Former  ?  Packs/day: 0.50  ?   Years: 8.00  ?  Pack years: 4.00  ?  Types: Cigarettes  ?  Quit date: 06/09/1978  ?  Years since quitting: 43.1  ? Smokeless tobacco: Never  ?Vaping Use  ? Vaping Use: Never used  ?Substance Use Topics  ? Alcohol use: No  ? Drug use: No  ?  ?No current facility-administered medications for this encounter. ? ?Current Outpatient Medications:  ?  albuterol (PROVENTIL) (2.5 MG/3ML) 0.083% nebulizer solution, Take 2.5 mg by nebulization every 6 (six) hours as needed for shortness of breath or wheezing., Disp: , Rfl:  ?  Ascorbic Acid (VITAMIN C) 1000 MG tablet, Take 1,000 mg by mouth daily., Disp: , Rfl:  ?  atorvastatin (LIPITOR) 10 MG tablet, Take 10 mg by mouth daily., Disp: , Rfl:  ?  benzonatate (TESSALON) 200 MG capsule, Take 1 capsule (200 mg total) by mouth 3 (three) times daily as needed for cough., Disp: 45 capsule, Rfl: 1 ?  BLACK ELDERBERRY PO, Take 1 each by mouth daily. Elderberry gummy, Disp: ,  Rfl:  ?  Budeson-Glycopyrrol-Formoterol (BREZTRI AEROSPHERE) 160-9-4.8 MCG/ACT AERO, Inhale 2 puffs into the lungs in the morning and at bedtime., Disp: 10.7 g, Rfl: 3 ?  buPROPion (WELLBUTRIN XL) 300 MG 24 hr tablet, Take 300 mg by mouth every morning., Disp: , Rfl:  ?  carbamazepine (TEGRETOL) 100 MG chewable tablet, Chew 400 mg by mouth at bedtime., Disp: , Rfl:  ?  cetirizine (ZYRTEC) 10 MG tablet, Take 10 mg by mouth daily., Disp: , Rfl:  ?  Cholecalciferol (VITAMIN D3) 50 MCG (2000 UT) capsule, Take 2,000 Units by mouth daily., Disp: , Rfl:  ?  Cyanocobalamin (VITAMIN B-12) 2500 MCG SUBL, Place 2,500 mcg under the tongue daily., Disp: , Rfl:  ?  docusate sodium (COLACE) 50 MG capsule, Take 150 mg by mouth 2 (two) times daily., Disp: , Rfl:  ?  EPINEPHrine 0.3 mg/0.3 mL IJ SOAJ injection, Inject 0.3 mg into the muscle as needed for anaphylaxis., Disp: , Rfl:  ?  lamoTRIgine (LAMICTAL) 150 MG tablet, Take 300 mg by mouth at bedtime., Disp: , Rfl:  ?  meloxicam (MOBIC) 15 MG tablet, Take 15 mg by mouth daily.,  Disp: , Rfl:  ?  Mepolizumab (NUCALA) 100 MG/ML SOAJ, Inject 1 mL (100 mg total) into the skin every 28 (twenty-eight) days., Disp: 3 mL, Rfl: 2 ?  montelukast (SINGULAIR) 10 MG tablet, Take 10 mg by mouth at bedtime., Disp: , Rfl:  ?  pantoprazole (PROTONIX) 40 MG tablet, Take 40 mg by mouth daily., Disp: , Rfl:  ?  primidone (MYSOLINE) 50 MG tablet, Take 1 tablet (50 mg total) by mouth in the morning and at bedtime., Disp: 60 tablet, Rfl: 5 ?  zinc gluconate 50 MG tablet, Take 50 mg by mouth daily., Disp: , Rfl:  ?Allergies  ?Allergen Reactions  ? Cymbalta [Duloxetine Hcl] Other (See Comments)  ?  serotonin syndrome  ? Divalproex Sodium Other (See Comments)  ?  unknown  ? Effexor [Venlafaxine Hydrochloride] Other (See Comments)  ?  serotonin syndrome  ? Geodon [Ziprasidone Hydrochloride] Other (See Comments)  ?  Hands and feet tingling.  ? Prozac [Fluoxetine Hcl] Other (See Comments)  ?  suicidal  ? Thorazine [Chlorpromazine Hcl] Other (See Comments)  ?  unknown  ? Topamax [Topiramate] Other (See Comments)  ?  Unsure of reaction   ? Cephalexin Rash  ?  Joint swelling  ? Cephalosporins Rash  ?  Joint swelling  ? ?Keflex  ? Codeine Nausea Only  ?  Pt states must take phenergan with codeine.  ?  ?  ? Morphine Nausea Only  ?   ?  ? Theo-Dur [Theophylline] Palpitations  ? Ziprasidone Nausea Only and Other (See Comments)  ?  Hands and feet tingling.  ? ? ?  ?Physical Exam ?Constitutional:   ?   General: She is not in acute distress. ?   Appearance: She is well-developed.  ?   Comments: Well developed, well nourished ?Normal grooming and hygiene  ?  ?  ?Cardiovascular:  ?   Comments: No peripheral edema ?Musculoskeletal:  ?   Left knee: Bony tenderness present. No swelling, deformity, effusion, erythema, ecchymosis, lacerations or crepitus. Normal range of motion. Tenderness present over the medial joint line. Abnormal meniscus. Normal alignment.  ?   Instability Tests: Anterior drawer test negative. Posterior drawer  test negative. Medial McMurray test positive.  ?Skin: ?   General: Skin is warm and dry.  ?Neurological:  ?   Mental Status: She is alert  and oriented to person, place, and time.  ?   Sensory: No sensory deficit.  ?   Coordination: Coordination normal.  ?   Gait: Gait normal.  ?   Deep Tendon Reflexes: Reflexes are normal and symmetric.  ?Psychiatric:     ?   Mood and Affect: Mood normal.     ?   Behavior: Behavior normal.     ?   Thought Content: Thought content normal.     ?   Judgment: Judgment normal.  ?   Comments: Affect normal   ?  ? ?X-rays are obtained and they show mild narrowing of the medial compartment with normal alignment. ? ? ? POPLITEAL FOSSA: Popliteus tendon is intact. Small Baker's cyst. ?  ?EXTENSOR MECHANISM: Intact quadriceps tendon. Intact patellar ?tendon. Intact lateral patellar retinaculum. Intact medial patellar ?retinaculum. Intact MPFL. ?  ?BONES: No aggressive osseous lesion. No fracture or dislocation. ?  ?Other: No fluid collection or hematoma. Muscles are normal. ?  ?IMPRESSION: ?1. Oblique tear of the body of the medial meniscus extending to the ?inferior articular surface. ?2. High-grade partial-thickness cartilage loss of full-thickness ?cartilage loss of the posterior nonweightbearing surface of the ?medial femoral condyle with subchondral cystic changes. ?3. Intact ACL with mucinous degeneration. ?  ?  ?Electronically Signed ?  By: Kathreen Devoid M.D. ?  On: 07/31/2021 06:44 ? ?Torn medial meniscus left knee with osteoarthritis ? ?Plan arthroscopy left knee partial medial meniscectomy ?  ?

## 2021-08-09 ENCOUNTER — Ambulatory Visit (HOSPITAL_COMMUNITY): Payer: PPO | Admitting: Anesthesiology

## 2021-08-09 ENCOUNTER — Encounter (HOSPITAL_COMMUNITY)
Admission: RE | Disposition: A | Discharge status: Home / Self Care | Payer: Self-pay | Source: Home / Self Care | Attending: Orthopedic Surgery

## 2021-08-09 ENCOUNTER — Other Ambulatory Visit: Payer: Self-pay | Admitting: Orthopedic Surgery

## 2021-08-09 ENCOUNTER — Encounter (HOSPITAL_COMMUNITY): Payer: Self-pay | Admitting: Orthopedic Surgery

## 2021-08-09 ENCOUNTER — Ambulatory Visit (HOSPITAL_BASED_OUTPATIENT_CLINIC_OR_DEPARTMENT_OTHER): Payer: PPO | Admitting: Anesthesiology

## 2021-08-09 ENCOUNTER — Other Ambulatory Visit: Payer: Self-pay

## 2021-08-09 ENCOUNTER — Ambulatory Visit (HOSPITAL_COMMUNITY)
Admission: RE | Admit: 2021-08-09 | Discharge: 2021-08-09 | Disposition: A | Discharge status: Home / Self Care | Payer: PPO | Attending: Orthopedic Surgery | Admitting: Orthopedic Surgery

## 2021-08-09 ENCOUNTER — Encounter: Payer: Self-pay | Admitting: Orthopedic Surgery

## 2021-08-09 DIAGNOSIS — K219 Gastro-esophageal reflux disease without esophagitis: Secondary | ICD-10-CM | POA: Diagnosis not present

## 2021-08-09 DIAGNOSIS — N289 Disorder of kidney and ureter, unspecified: Secondary | ICD-10-CM | POA: Diagnosis not present

## 2021-08-09 DIAGNOSIS — F319 Bipolar disorder, unspecified: Secondary | ICD-10-CM | POA: Insufficient documentation

## 2021-08-09 DIAGNOSIS — J45909 Unspecified asthma, uncomplicated: Secondary | ICD-10-CM | POA: Insufficient documentation

## 2021-08-09 DIAGNOSIS — K449 Diaphragmatic hernia without obstruction or gangrene: Secondary | ICD-10-CM | POA: Diagnosis not present

## 2021-08-09 DIAGNOSIS — S83242D Other tear of medial meniscus, current injury, left knee, subsequent encounter: Secondary | ICD-10-CM

## 2021-08-09 DIAGNOSIS — X58XXXA Exposure to other specified factors, initial encounter: Secondary | ICD-10-CM | POA: Diagnosis not present

## 2021-08-09 DIAGNOSIS — S83242A Other tear of medial meniscus, current injury, left knee, initial encounter: Secondary | ICD-10-CM

## 2021-08-09 DIAGNOSIS — M797 Fibromyalgia: Secondary | ICD-10-CM | POA: Insufficient documentation

## 2021-08-09 HISTORY — PX: KNEE ARTHROSCOPY WITH MEDIAL MENISECTOMY: SHX5651

## 2021-08-09 SURGERY — ARTHROSCOPY, KNEE, WITH MEDIAL MENISCECTOMY
Anesthesia: General | Site: Knee | Laterality: Left

## 2021-08-09 MED ORDER — LACTATED RINGERS IV SOLN: INTRAVENOUS | Status: DC

## 2021-08-09 MED ORDER — HYDROMORPHONE HCL 1 MG/ML IJ SOLN
0.2500 mg | INTRAMUSCULAR | Status: DC | PRN
  Administered 2021-08-09 (×3): 0.5 mg via INTRAVENOUS
  Filled 2021-08-09 (×2): qty 0.5

## 2021-08-09 MED ORDER — MIDAZOLAM HCL 2 MG/2ML IJ SOLN
INTRAMUSCULAR | Status: AC
  Filled 2021-08-09: qty 2

## 2021-08-09 MED ORDER — DEXAMETHASONE SODIUM PHOSPHATE 10 MG/ML IJ SOLN
INTRAMUSCULAR | Status: AC
  Filled 2021-08-09: qty 1

## 2021-08-09 MED ORDER — BUPIVACAINE-EPINEPHRINE (PF) 0.5% -1:200000 IJ SOLN
INTRAMUSCULAR | Status: AC
  Filled 2021-08-09: qty 30

## 2021-08-09 MED ORDER — PROPOFOL 10 MG/ML IV BOLUS
INTRAVENOUS | Status: DC | PRN
  Administered 2021-08-09: 170 mg via INTRAVENOUS

## 2021-08-09 MED ORDER — BUPIVACAINE-EPINEPHRINE (PF) 0.5% -1:200000 IJ SOLN
INTRAMUSCULAR | Status: DC | PRN
  Administered 2021-08-09: 30 mL

## 2021-08-09 MED ORDER — CHLORHEXIDINE GLUCONATE 0.12 % MT SOLN
15.0000 mL | Freq: Once | OROMUCOSAL | Status: AC
  Administered 2021-08-09: 15 mL via OROMUCOSAL

## 2021-08-09 MED ORDER — PROMETHAZINE HCL 12.5 MG PO TABS: 12.5000 mg | ORAL_TABLET | Freq: Four times a day (QID) | ORAL | 0 refills | Status: DC | PRN

## 2021-08-09 MED ORDER — EPINEPHRINE PF 1 MG/ML IJ SOLN
INTRAMUSCULAR | Status: AC
  Filled 2021-08-09: qty 4

## 2021-08-09 MED ORDER — PROPOFOL 10 MG/ML IV BOLUS
INTRAVENOUS | Status: AC
  Filled 2021-08-09: qty 20

## 2021-08-09 MED ORDER — LIDOCAINE HCL (PF) 2 % IJ SOLN
INTRAMUSCULAR | Status: AC
  Filled 2021-08-09: qty 5

## 2021-08-09 MED ORDER — HYDROCODONE-ACETAMINOPHEN 7.5-325 MG PO TABS: 1.0000 | ORAL_TABLET | ORAL | 0 refills | Status: AC | PRN

## 2021-08-09 MED ORDER — SODIUM CHLORIDE 0.9 % IR SOLN
Status: DC | PRN
  Administered 2021-08-09: 1000 mL

## 2021-08-09 MED ORDER — PROMETHAZINE HCL 25 MG/ML IJ SOLN
6.2500 mg | INTRAMUSCULAR | Status: DC | PRN
  Administered 2021-08-09: 12.5 mg via INTRAVENOUS
  Filled 2021-08-09: qty 1

## 2021-08-09 MED ORDER — VANCOMYCIN HCL 1500 MG/300ML IV SOLN
1500.0000 mg | INTRAVENOUS | Status: AC
  Administered 2021-08-09: 1000 mg via INTRAVENOUS
  Filled 2021-08-09 (×2): qty 300

## 2021-08-09 MED ORDER — HYDROMORPHONE HCL 1 MG/ML IJ SOLN
INTRAMUSCULAR | Status: AC
  Filled 2021-08-09: qty 0.5

## 2021-08-09 MED ORDER — SODIUM CHLORIDE 0.9 % IR SOLN
Status: DC | PRN
  Administered 2021-08-09 (×2): 3000 mL

## 2021-08-09 MED ORDER — EPINEPHRINE PF 1 MG/ML IJ SOLN
INTRAMUSCULAR | Status: AC
Start: 2021-08-09 — End: ?
  Filled 2021-08-09: qty 4

## 2021-08-09 MED ORDER — MIDAZOLAM HCL 5 MG/5ML IJ SOLN
INTRAMUSCULAR | Status: DC | PRN
  Administered 2021-08-09: 2 mg via INTRAVENOUS

## 2021-08-09 MED ORDER — LIDOCAINE 2% (20 MG/ML) 5 ML SYRINGE
INTRAMUSCULAR | Status: DC | PRN
Start: 2021-08-09 — End: 2021-08-09
  Administered 2021-08-09: 80 mg via INTRAVENOUS

## 2021-08-09 MED ORDER — FENTANYL CITRATE (PF) 100 MCG/2ML IJ SOLN
INTRAMUSCULAR | Status: AC
  Filled 2021-08-09: qty 2

## 2021-08-09 MED ORDER — DEXAMETHASONE SODIUM PHOSPHATE 10 MG/ML IJ SOLN
INTRAMUSCULAR | Status: DC | PRN
  Administered 2021-08-09: 5 mg via INTRAVENOUS

## 2021-08-09 MED ORDER — HYDROCODONE-ACETAMINOPHEN 5-325 MG PO TABS: 1.0000 | ORAL_TABLET | ORAL | 0 refills | Status: DC | PRN

## 2021-08-09 MED ORDER — ONDANSETRON HCL 4 MG/2ML IJ SOLN
INTRAMUSCULAR | Status: AC
  Filled 2021-08-09: qty 2

## 2021-08-09 MED ORDER — FENTANYL CITRATE (PF) 100 MCG/2ML IJ SOLN
INTRAMUSCULAR | Status: DC | PRN
  Administered 2021-08-09: 50 ug via INTRAVENOUS
  Administered 2021-08-09 (×2): 25 ug via INTRAVENOUS

## 2021-08-09 MED ORDER — ONDANSETRON HCL 4 MG/2ML IJ SOLN
INTRAMUSCULAR | Status: DC | PRN
  Administered 2021-08-09: 4 mg via INTRAVENOUS

## 2021-08-09 MED ORDER — ORAL CARE MOUTH RINSE: 15.0000 mL | Freq: Once | OROMUCOSAL | Status: AC

## 2021-08-09 SURGICAL SUPPLY — 48 items
APL PRP STRL LF DISP 70% ISPRP (MISCELLANEOUS) ×1
BAG HAMPER (MISCELLANEOUS) ×3 IMPLANT
BANDAGE ELASTIC 6 VELCRO NS (GAUZE/BANDAGES/DRESSINGS) ×1 IMPLANT
BLADE SHAVER TORPEDO 4X13 (MISCELLANEOUS) ×1 IMPLANT
BLADE SURG SZ11 CARB STEEL (BLADE) ×2 IMPLANT
BNDG CMPR STD VLCR NS LF 5.8X6 (GAUZE/BANDAGES/DRESSINGS) ×1
BNDG ELASTIC 6X5.8 VLCR NS LF (GAUZE/BANDAGES/DRESSINGS) ×2 IMPLANT
CHLORAPREP W/TINT 26 (MISCELLANEOUS) ×2 IMPLANT
CLOTH BEACON ORANGE TIMEOUT ST (SAFETY) ×2 IMPLANT
COOLER ICEMAN CLASSIC (MISCELLANEOUS) ×2 IMPLANT
CUFF TOURN SGL QUICK 34 (TOURNIQUET CUFF) ×2
CUFF TRNQT CYL 34X4.125X (TOURNIQUET CUFF) IMPLANT
DECANTER SPIKE VIAL GLASS SM (MISCELLANEOUS) ×3 IMPLANT
DRAPE HALF SHEET 40X57 (DRAPES) ×3 IMPLANT
GAUZE 4X4 16PLY ~~LOC~~+RFID DBL (SPONGE) ×3 IMPLANT
GAUZE SPONGE 4X4 12PLY STRL (GAUZE/BANDAGES/DRESSINGS) ×3 IMPLANT
GAUZE SPONGE 4X4 16PLY XRAY LF (GAUZE/BANDAGES/DRESSINGS) ×2 IMPLANT
GAUZE XEROFORM 5X9 LF (GAUZE/BANDAGES/DRESSINGS) ×2 IMPLANT
GLOVE SS N UNI LF 8.5 STRL (GLOVE) ×3 IMPLANT
GLOVE SURG POLYISO LF SZ8 (GLOVE) ×3 IMPLANT
GLOVE SURG UNDER POLY LF SZ7 (GLOVE) ×6 IMPLANT
GOWN STRL REUS W/TWL LRG LVL3 (GOWN DISPOSABLE) ×2 IMPLANT
GOWN STRL REUS W/TWL XL LVL3 (GOWN DISPOSABLE) ×2 IMPLANT
IV NS IRRIG 3000ML ARTHROMATIC (IV SOLUTION) ×4 IMPLANT
KIT BLADEGUARD II DBL (SET/KITS/TRAYS/PACK) ×3 IMPLANT
KIT TURNOVER CYSTO (KITS) ×2 IMPLANT
MANIFOLD NEPTUNE II (INSTRUMENTS) ×2 IMPLANT
MARKER SKIN DUAL TIP RULER LAB (MISCELLANEOUS) ×2 IMPLANT
NDL HYPO 18GX1.5 BLUNT FILL (NEEDLE) ×1 IMPLANT
NDL HYPO 21X1.5 SAFETY (NEEDLE) ×1 IMPLANT
NDL SPNL 18GX3.5 QUINCKE PK (NEEDLE) ×2 IMPLANT
NEEDLE HYPO 18GX1.5 BLUNT FILL (NEEDLE) ×2 IMPLANT
NEEDLE HYPO 21X1.5 SAFETY (NEEDLE) ×2 IMPLANT
NEEDLE SPNL 18GX3.5 QUINCKE PK (NEEDLE) ×2 IMPLANT
NS IRRIG 1000ML POUR BTL (IV SOLUTION) ×2 IMPLANT
PACK ARTHRO LIMB DRAPE STRL (MISCELLANEOUS) ×2 IMPLANT
PAD ABD 5X9 TENDERSORB (GAUZE/BANDAGES/DRESSINGS) ×2 IMPLANT
PAD ARMBOARD 7.5X6 YLW CONV (MISCELLANEOUS) ×2 IMPLANT
PAD COLD SHLDR WRAP-ON (PAD) ×1 IMPLANT
PAD FOR LEG HOLDER (MISCELLANEOUS) ×2 IMPLANT
PADDING CAST COTTON 6X4 STRL (CAST SUPPLIES) ×3 IMPLANT
SET ARTHROSCOPY INST (INSTRUMENTS) ×2 IMPLANT
SET BASIN LINEN APH (SET/KITS/TRAYS/PACK) ×2 IMPLANT
SUT ETHILON 3 0 FSL (SUTURE) ×2 IMPLANT
SYR 10ML LL (SYRINGE) ×3 IMPLANT
SYR 30ML LL (SYRINGE) ×2 IMPLANT
TUBE CONNECTING 12X1/4 (SUCTIONS) ×4 IMPLANT
TUBING IN/OUT FLOW W/MAIN PUMP (TUBING) ×2 IMPLANT

## 2021-08-09 NOTE — Anesthesia Procedure Notes (Signed)
Procedure Name: LMA Insertion ?Date/Time: 08/09/2021 7:38 AM ?Performed by: Myna Bright, CRNA ?Pre-anesthesia Checklist: Patient identified, Emergency Drugs available, Suction available and Patient being monitored ?Patient Re-evaluated:Patient Re-evaluated prior to induction ?Oxygen Delivery Method: Circle system utilized ?Preoxygenation: Pre-oxygenation with 100% oxygen ?Induction Type: IV induction ?Ventilation: Mask ventilation without difficulty ?LMA: LMA inserted ?LMA Size: 4.0 ?Number of attempts: 1 ?Placement Confirmation: positive ETCO2 and breath sounds checked- equal and bilateral ?Tube secured with: Tape ?Dental Injury: Teeth and Oropharynx as per pre-operative assessment  ? ? ? ? ?

## 2021-08-09 NOTE — Anesthesia Postprocedure Evaluation (Signed)
Anesthesia Post Note ? ?Patient: Nancy [name] ? ?Procedure(s) Performed: KNEE ARTHROSCOPY WITH PARTIAL MEDIAL MENISCECTOMY (Left: Knee) ? ?Patient location during evaluation: Phase II ?Anesthesia Type: General ?Level of consciousness: awake and alert and oriented ?Pain management: pain level controlled ?Vital Signs Assessment: post-procedure vital signs reviewed and stable ?Respiratory status: spontaneous breathing, nonlabored ventilation and respiratory function stable ?Cardiovascular status: blood pressure returned to baseline and stable ?Postop Assessment: no apparent nausea or vomiting ?Anesthetic complications: no ? ? ?No notable events documented. ? ? ?Last Vitals:  ?Vitals:  ? 08/09/21 0915 08/09/21 0927  ?BP: 131/71 (!) 150/76  ?Pulse: 94 92  ?Resp: (!) 23 20  ?Temp:  36.7 ?C  ?SpO2: 97% 96%  ?  ?Last Pain:  ?Vitals:  ? 08/09/21 0927  ?TempSrc: Temporal  ?PainSc: 4   ? ? ?  ?  ?  ?  ?  ?  ? ?Aiysha Jillson C Neelam Tiggs ? ? ? ? ?

## 2021-08-09 NOTE — Brief Op Note (Signed)
08/09/2021 ? ?8:22 AM ? ?PATIENT:  Nancy [name]  56 y.o. female ? ?PRE-OPERATIVE DIAGNOSIS:  left knee medial meniscus tear ? ?POST-OPERATIVE DIAGNOSIS:  left knee medial meniscus tear ? ?PROCEDURE:  Procedure(s): ?KNEE ARTHROSCOPY WITH PARTIAL MEDIAL MENISCECTOMY (Left) ? ?SURGEON:  Surgeon(s) and Role: ?   Carole Civil, MD - Primary ? ?PHYSICIAN ASSISTANT:  ? ?ASSISTANTS: none  ? ?ANESTHESIA:   general ? ?EBL:  10 mL  ? ?BLOOD ADMINISTERED:none ? ?DRAINS: none  ? ?LOCAL MEDICATIONS USED:  MARCAINE    ? ?SPECIMEN:  No Specimen ? ?DISPOSITION OF SPECIMEN:  N/A ? ?COUNTS:  YES ? ?TOURNIQUET:  * Missing tourniquet times found for documented tourniquets in log: [id number] * ? ?DICTATION: .Dragon Dictation ? ?PLAN OF CARE: Discharge to home after PACU ? ?PATIENT DISPOSITION:  PACU - hemodynamically stable. ?  ?Delay start of Pharmacological VTE agent (>24hrs) due to surgical blood loss or risk of bleeding: not applicable ? ?

## 2021-08-09 NOTE — Progress Notes (Signed)
Original order for vancomycin 1500 mg. Vancomycin spiked and added to present IV site and ready for infusion. When Dr Aline Brochure arrived at bedside, vancomycin order verified and changed to 1000 mg. Barrett Henle CRNA aware and stated that she would stop vancomycin when 2/3 infused. This was approved by Dr Charna Elizabeth and Dr Aline Brochure.  ?

## 2021-08-09 NOTE — Transfer of Care (Signed)
Immediate Anesthesia Transfer of Care Note ? ?Patient: Nancy [name] ? ?Procedure(s) Performed: KNEE ARTHROSCOPY WITH PARTIAL MEDIAL MENISCECTOMY (Left: Knee) ? ?Patient Location: PACU ? ?Anesthesia Type:General ? ?Level of Consciousness: awake, alert , oriented and patient cooperative ? ?Airway & Oxygen Therapy: Patient Spontanous Breathing and Patient connected to face mask oxygen ? ?Post-op Assessment: Report given to RN, Post -op Vital signs reviewed and stable and Patient moving all extremities ? ?Post vital signs: Reviewed and stable ? ?Last Vitals:  ?Vitals Value Taken Time  ?BP 136/85 08/09/21 0830  ?Temp    ?Pulse 96 08/09/21 0832  ?Resp 11 08/09/21 0832  ?SpO2 100 % 08/09/21 0832  ?Vitals shown include unvalidated device data. ? ?Last Pain:  ?Vitals:  ? 08/09/21 0700  ?PainSc: 0-No pain  ?   ? ?  ? ?Complications: No notable events documented. ?

## 2021-08-09 NOTE — Anesthesia Preprocedure Evaluation (Signed)
Anesthesia Evaluation  ?Patient identified by MRN, date of birth, ID band ?Patient awake ? ? ? ?Reviewed: ?Allergy & Precautions, NPO status , Patient's Chart, lab work & pertinent test results ? ?Airway ?Mallampati: II ? ?TM Distance: >3 FB ?Neck ROM: Full ? ? ?Comment: Neck pain Dental ? ?(+) Dental Advisory Given, Teeth Intact ?  ?Pulmonary ?asthma , pneumonia, neg COPD,  COPD inhaler, former smoker,  ?  ?Pulmonary exam normal ?breath sounds clear to auscultation ? ? ? ? ? ? Cardiovascular ?negative cardio ROS ?Normal cardiovascular exam ?Rhythm:Regular Rate:Normal ? ? ?  ?Neuro/Psych ? Headaches, PSYCHIATRIC DISORDERS Bipolar Disorder  Neuromuscular disease   ? GI/Hepatic ?Neg liver ROS, hiatal hernia, GERD  Medicated and Controlled,  ?Endo/Other  ?negative endocrine ROS ? Renal/GU ?Renal disease  ?negative genitourinary ?  ?Musculoskeletal ? ?(+) Arthritis , Fibromyalgia - ? Abdominal ?  ?Peds ?negative pediatric ROS ?(+)  Hematology ?negative hematology ROS ?(+)   ?Anesthesia Other Findings ?Chronic neck and back pain ?Serotonin syndrome with Cymbalta and effexor   ? Reproductive/Obstetrics ?negative OB ROS ? ?  ? ? ? ? ? ? ? ? ? ? ? ? ? ?  ?  ? ? ? ? ? ? ? ?Anesthesia Physical ?Anesthesia Plan ? ?ASA: 2 ? ?Anesthesia Plan: General  ? ?Post-op Pain Management: Dilaudid IV  ? ?Induction: Intravenous ? ?PONV Risk Score and Plan: Ondansetron and Dexamethasone ? ?Airway Management Planned: LMA ? ?Additional Equipment:  ? ?Intra-op Plan:  ? ?Post-operative Plan: Extubation in OR ? ?Informed Consent: I have reviewed the patients History and Physical, chart, labs and discussed the procedure including the risks, benefits and alternatives for the proposed anesthesia with the patient or authorized representative who has indicated his/her understanding and acceptance.  ? ? ? ?Dental advisory given ? ?Plan Discussed with: CRNA and Surgeon ? ?Anesthesia Plan Comments:    ? ? ? ? ? ?Anesthesia Quick Evaluation ? ?

## 2021-08-09 NOTE — Progress Notes (Signed)
Instructed on incentive spirometer. 1750 ml obtained. Tolerated well. 

## 2021-08-09 NOTE — Op Note (Signed)
08/09/2021 ? ?8:22 AM ? ?PATIENT:  Nancy [name]  56 y.o. female ? ?PRE-OPERATIVE DIAGNOSIS:  left knee medial meniscus tear ? ?POST-OPERATIVE DIAGNOSIS:  left knee medial meniscus tear ? ?Intra-Op findings ? ?Preop exam under anesthesia normal stable ligaments with hyperextension of the knee which was physiologic ? ?Intra-articular findings ? ?Medial compartment medial meniscus torn at the body, mild irregular chondral surfaces but no partial or full-thickness defects in the medial femoral condyle or tibial plateau ? ?The ACL and PCL were normal ? ?Lateral compartment lateral meniscus normal chondral surfaces normal ? ?Patellofemoral joint normal ? ?No synovitis in the joint ? ?PROCEDURE:  Procedure(s): ?KNEE ARTHROSCOPY WITH PARTIAL MEDIAL MENISCECTOMY (Left) ? ?SURGEON:  Surgeon(s) and Role: ?   Carole Civil, MD - Primary ? ?The surgery was done in the following manner ? ?The patient was seen in the preop area and evaluated and cleared for surgery.  A chart review was performed and the images were reviewed.  The surgical site was confirmed and marked as left knee ? ?She was taken to the operating room she was given 1 g of vancomycin.  General anesthesia was administered successfully.  She was in the supine position.  The left leg was prepped and draped sterilely and held in an arthroscopic leg holder ([employer]) ? ?Standard timeout was completed surgery site confirmed ? ?The lateral portal was injected with Marcaine with epinephrine and the lateral portal was established and the scope was placed into the joint.  A diagnostic arthroscopy was performed.  A medial portal was established with the assistance of a spinal needle and a side biter was used to morselized the meniscus tear.  The meniscal fragments were removed with a straight motorized shaver torpedo blade and then the remaining meniscus was balanced with the same. ? ?Confirmation of stable remaining tissue was performed with the probe ? ?The knee was  then irrigated all debris was removed.  The portals were closed with 3, 3-0 nylon sutures.  25 cc of Marcaine with epinephrine was injected into the joint. ? ?Sterile bandages were applied followed by an Ace bandage and a Cryo/Cuff was applied and activated ? ?Postoperative plan ? ?Full weightbearing as tolerated ?Active range of motion exercises ?Follow-up in a week ? ? ?PHYSICIAN ASSISTANT:  ? ?ASSISTANTS: none  ? ?ANESTHESIA:   general ? ?EBL:  10 mL  ? ?BLOOD ADMINISTERED:none ? ?DRAINS: none  ? ?LOCAL MEDICATIONS USED:  MARCAINE    ? ?SPECIMEN:  No Specimen ? ?DISPOSITION OF SPECIMEN:  N/A ? ?COUNTS:  YES ? ?TOURNIQUET:  * Missing tourniquet times found for documented tourniquets in log: [id number] * ? ?DICTATION: .Dragon Dictation ? ?PLAN OF CARE: Discharge to home after PACU ? ?PATIENT DISPOSITION:  PACU - hemodynamically stable. ?  ?Delay start of Pharmacological VTE agent (>24hrs) due to surgical blood loss or risk of bleeding: not applicable ? ? ? ?

## 2021-08-09 NOTE — Telephone Encounter (Signed)
Reached out and spoke with rep at [facility], pt was re-approved for PAP as of 07/15/2021 until 06/08/2022. Rep states that pharmacy should have already reached out to pt in order to schedule shipment. Contacted pt and verified that this assumption was accurate. Requested that approval letter be re-faxed to [place] which will be sent to the scan center. Nothing further needed at this time. ?

## 2021-08-09 NOTE — Interval H&P Note (Signed)
History and Physical Interval Note: ? ?08/09/2021 ?7:22 AM ? ?Nancy [name]  has presented today for surgery, with the diagnosis of left knee medial meniscus tear.  The various methods of treatment have been discussed with the patient and family. After consideration of risks, benefits and other options for treatment, the patient has consented to  Procedure(s): ?KNEE ARTHROSCOPY WITH MEDIAL MENISCECTOMY (Left) as a surgical intervention.  The patient's history has been reviewed, patient examined, no change in status, stable for surgery.  I have reviewed the patient's chart and labs.  Questions were answered to the patient's satisfaction.   ? ? ?Arther Abbott ? ? ?

## 2021-08-10 ENCOUNTER — Emergency Department (HOSPITAL_COMMUNITY)
Admission: EM | Admit: 2021-08-10 | Discharge: 2021-08-11 | Disposition: A | Discharge status: Home / Self Care | Payer: PPO | Attending: Emergency Medicine | Admitting: Emergency Medicine

## 2021-08-10 ENCOUNTER — Other Ambulatory Visit: Payer: Self-pay

## 2021-08-10 ENCOUNTER — Emergency Department (HOSPITAL_COMMUNITY): Payer: PPO

## 2021-08-10 ENCOUNTER — Encounter (HOSPITAL_COMMUNITY): Payer: Self-pay

## 2021-08-10 DIAGNOSIS — M25462 Effusion, left knee: Secondary | ICD-10-CM | POA: Diagnosis not present

## 2021-08-10 DIAGNOSIS — G8918 Other acute postprocedural pain: Secondary | ICD-10-CM | POA: Diagnosis not present

## 2021-08-10 DIAGNOSIS — M25562 Pain in left knee: Secondary | ICD-10-CM | POA: Diagnosis not present

## 2021-08-10 MED ORDER — ONDANSETRON 8 MG PO TBDP
8.0000 mg | ORAL_TABLET | Freq: Once | ORAL | Status: AC
  Administered 2021-08-10: 8 mg via ORAL
  Filled 2021-08-10: qty 1

## 2021-08-10 MED ORDER — HYDROMORPHONE HCL 1 MG/ML IJ SOLN
1.0000 mg | Freq: Once | INTRAMUSCULAR | Status: DC
  Filled 2021-08-10: qty 1

## 2021-08-10 MED ORDER — OXYCODONE-ACETAMINOPHEN 5-325 MG PO TABS
2.0000 | ORAL_TABLET | Freq: Once | ORAL | Status: AC
  Administered 2021-08-10: 2 via ORAL
  Filled 2021-08-10: qty 2

## 2021-08-10 MED ORDER — HYDROMORPHONE HCL 1 MG/ML IJ SOLN
1.0000 mg | Freq: Once | INTRAMUSCULAR | Status: AC
  Administered 2021-08-10: 1 mg via INTRAMUSCULAR
  Filled 2021-08-10: qty 1

## 2021-08-10 MED ORDER — KETAMINE HCL 50 MG/5ML IJ SOSY
0.3000 mg/kg | PREFILLED_SYRINGE | Freq: Once | INTRAMUSCULAR | Status: AC
  Administered 2021-08-10: 23 mg via INTRAVENOUS
  Filled 2021-08-10: qty 5

## 2021-08-10 MED ORDER — KETOROLAC TROMETHAMINE 30 MG/ML IJ SOLN
30.0000 mg | Freq: Once | INTRAMUSCULAR | Status: AC
  Administered 2021-08-10: 30 mg via INTRAMUSCULAR
  Filled 2021-08-10: qty 1

## 2021-08-10 NOTE — ED Provider Notes (Signed)
[facility] Provider Note   CSN: [id number] Arrival date & time: 08/10/21  1834     History  Chief Complaint  Patient presents with   Post-op Problem    Left Knee Scope    Nancy [name] is a 56 y.o. female.  HPI      Nancy [name] is a 56 y.o. female who presents to the Emergency Department complaining of sharp pain of her left knee and lower leg that began this afternoon around 4 PM.  She had arthroscopic procedure of the left knee performed yesterday by Dr. Aline Brochure.  Patient states she was doing well until this afternoon.  She was prescribed 7.5 mg hydrocodone but states that she did not take 1 today because her knee was not hurting.  She took 1 this afternoon when the pain began but this did not help.  She describes a sharp pain that radiates from her left knee down to her foot and is worse with weightbearing.  Pain is improved with the left knee slightly flexed and lying on her left side.  She denies fever, chills, nausea or vomiting.  No redness or swelling of the knee joint.  States that her toes feel cold.   Home Medications Prior to Admission medications   Medication Sig Start Date End Date Taking? Authorizing Provider  albuterol (PROVENTIL) (2.5 MG/3ML) 0.083% nebulizer solution Take 2.5 mg by nebulization every 6 (six) hours as needed for shortness of breath or wheezing.    [provider]  Ascorbic Acid (VITAMIN C) 1000 MG tablet Take 1,000 mg by mouth daily.    [provider]  atorvastatin (LIPITOR) 10 MG tablet Take 10 mg by mouth daily.    [provider]  benzonatate (TESSALON) 200 MG capsule Take 1 capsule (200 mg total) by mouth 3 (three) times daily as needed for cough. 07/01/21 07/01/22  Parrett, Fonnie Mu, NP  BLACK ELDERBERRY PO Take 1 each by mouth daily. Elderberry gummy    [provider]  Budeson-Glycopyrrol-Formoterol (BREZTRI AEROSPHERE) 160-9-4.8 MCG/ACT AERO Inhale 2 puffs into the lungs in the  morning and at bedtime. 06/13/21   Cobb, Karie Schwalbe, NP  buPROPion (WELLBUTRIN XL) 300 MG 24 hr tablet Take 300 mg by mouth every morning. 11/20/20   [provider]  carbamazepine (TEGRETOL) 100 MG chewable tablet Chew 400 mg by mouth at bedtime. 11/23/20   [provider]  cetirizine (ZYRTEC) 10 MG tablet Take 10 mg by mouth daily.    [provider]  Cholecalciferol (VITAMIN D3) 50 MCG (2000 UT) capsule Take 2,000 Units by mouth daily.    [provider]  Cyanocobalamin (VITAMIN B-12) 2500 MCG SUBL Place 2,500 mcg under the tongue daily.    [provider]  docusate sodium (COLACE) 50 MG capsule Take 150 mg by mouth 2 (two) times daily.    [provider]  EPINEPHrine 0.3 mg/0.3 mL IJ SOAJ injection Inject 0.3 mg into the muscle as needed for anaphylaxis.    [provider]  HYDROcodone-acetaminophen (NORCO) 7.5-325 MG tablet Take 1 tablet by mouth every 4 (four) hours as needed for up to 5 days for moderate pain. 08/09/21 08/14/21  Carole Civil, MD  lamoTRIgine (LAMICTAL) 150 MG tablet Take 300 mg by mouth at bedtime.    [provider]  meloxicam (MOBIC) 15 MG tablet Take 15 mg by mouth daily. 05/19/20   [provider]  Mepolizumab (NUCALA) 100 MG/ML SOAJ Inject 1 mL (100 mg  total) into the skin every 28 (twenty-eight) days. 03/25/21   Chesley Mires, MD  montelukast (SINGULAIR) 10 MG tablet Take 10 mg by mouth at bedtime.    [provider]  pantoprazole (PROTONIX) 40 MG tablet Take 40 mg by mouth daily.    [provider]  primidone (MYSOLINE) 50 MG tablet Take 1 tablet (50 mg total) by mouth in the morning and at bedtime. 07/16/21   Penumalli, Earlean Polka, MD  promethazine (PHENERGAN) 12.5 MG tablet Take 1 tablet (12.5 mg total) by mouth every 6 (six) hours as needed for nausea or vomiting. 08/09/21   Carole Civil, MD  zinc gluconate 50 MG tablet Take 50 mg by mouth daily.    [provider]      Allergies    Cymbalta [duloxetine hcl], Divalproex sodium, Effexor [venlafaxine hydrochloride], Geodon [ziprasidone hydrochloride], Prozac [fluoxetine hcl], Thorazine [chlorpromazine hcl], Topamax [topiramate], Cephalexin, Cephalosporins, Codeine, Morphine, Theo-dur [theophylline], and Ziprasidone    Review of Systems   Review of Systems  Constitutional:  Negative for appetite change, chills and fever.  Respiratory:  Negative for shortness of breath.   Cardiovascular:  Negative for chest pain.  Gastrointestinal:  Negative for diarrhea and vomiting.  Musculoskeletal:  Positive for arthralgias (Pain of left knee and left lower leg).  Skin:  Negative for color change and rash.  Neurological:  Negative for weakness and numbness.  All other systems reviewed and are negative.  Physical Exam Updated Vital Signs BP (!) 141/88 (BP Location: Right Arm)    Pulse 93    Temp 98.5 F (36.9 C) (Oral)    Resp 18    Ht '5\' 5"'$  (1.651 m)    Wt 76.2 kg    SpO2 97%    BMI 27.96 kg/m  Physical Exam Vitals and nursing note reviewed.  Constitutional:      Appearance: Normal appearance. She is not ill-appearing.  Cardiovascular:     Rate and Rhythm: Normal rate and regular rhythm.     Pulses: Normal pulses.     Comments: Patient has strong posterior tibial and dorsalis pedis pulses bilaterally. Pulmonary:     Effort: Pulmonary effort is normal.  Abdominal:     Palpations: Abdomen is soft.     Tenderness: There is no abdominal tenderness.  Musculoskeletal:        General: Tenderness present. No swelling.     Comments: Tenderness with range of motion of the left knee.  No palpable effusion, erythema or excessive warmth.  Patella tendon appears intact.  No edema or erythema of the left lower extremity.  Compartments are soft.  No calf tenderness or palpable cord.  No edema of the foot or toes.  Toes are warm and cap refill is good.  Skin:    General: Skin is warm.     Capillary Refill:  Capillary refill takes less than 2 seconds.  Neurological:     General: No focal deficit present.     Mental Status: She is alert.     Sensory: No sensory deficit.     Motor: No weakness.    ED Results / Procedures / Treatments   Labs (all labs ordered are listed, but only abnormal results are displayed) Labs Reviewed - No data to display  EKG None  Radiology DG Knee Complete 4 Views Left  Result Date: 08/10/2021 CLINICAL DATA:  Pain, arthroscopy yesterday EXAM: LEFT KNEE - COMPLETE 4+ VIEW COMPARISON:  07/15/2021 FINDINGS: No evidence of fracture or dislocation. Moderate knee  joint effusion with intra-articular air, in keeping with recent arthroscopy. No evidence of arthropathy or other focal bone abnormality. Soft tissues are unremarkable. IMPRESSION: Moderate knee joint effusion with intra-articular air, in keeping with recent arthroscopy. No fracture or dislocation. Joint spaces are preserved. Electronically Signed   By: Delanna Ahmadi M.D.   On: 08/10/2021 20:06    Procedures Procedures    Medications Ordered in ED Medications  HYDROmorphone (DILAUDID) injection 1 mg (1 mg Intramuscular Given 08/10/21 2039)  ondansetron (ZOFRAN-ODT) disintegrating tablet 8 mg (8 mg Oral Given 08/10/21 2039)  ketorolac (TORADOL) 30 MG/ML injection 30 mg (30 mg Intramuscular Given 08/10/21 2203)  oxyCODONE-acetaminophen (PERCOCET/ROXICET) 5-325 MG per tablet 2 tablet (2 tablets Oral Given 08/10/21 2202)  ketamine 50 mg in normal saline 5 mL (10 mg/mL) syringe (23 mg Intravenous Given 08/10/21 2257)    ED Course/ Medical Decision Making/ A&P                           Medical Decision Making Patient here for evaluation of left knee pain, status post 1 day arthroscopy secondary to meniscal tear.  Patient was prescribed 7.5 mg hydrocodone but has not taken any of her pain medication today until her pain became severe at 4 PM today.  Pain was unrelieved with hydrocodone.  Describes pain as as sudden in  onset.  Pain is likely secondary to recent surgical procedure.  No exam findings to suggest cellulitis or septic joint.  Compartments are soft and extremity is neurovascularly intact.  Doubt DVT.  Will obtain x-ray of the left knee  Amount and/or Complexity of Data Reviewed External Data Reviewed: notes.    Details: Prior medical records reviewed by me, including surgical note Radiology: ordered.    Details: X-ray of the left knee shows moderate joint effusion with intra-articular air in keeping with recent arthroscopy.  I agree with radiology interpretation  Risk Prescription drug management.   IM Dilaudid ordered.  On recheck, patient continues to have pain of her left knee.  Still rating pain at 9 out of 10.  Discussed findings with orthopedics, Dr. Aline Brochure who recommends Toradol and oxycodone.  On repeat exam, patient continuing to complain of pain.  Patient also seen by Dr. Doren Custard and care plan discussed.  IV Ketamine ordered.    On repeat exam, patient now resting comfortably.  Asleep but easily aroused.  Appears somnolent.  Answers questions appropriately.  States pain is much improved.  Now rating pain at 2-3 out of 10  Pt signed out to Dr. Earl Lites who agrees to reassess, patient will likely be discharged when she becomes more arousable.  She is agreeable to close orthopedic follow-up next week.        Final Clinical Impression(s) / ED Diagnoses Final diagnoses:  Acute post-operative pain    Rx / DC Orders ED Discharge Orders     None         Kem Parkinson, PA-C 08/10/21 2355    Godfrey Pick, MD 08/16/21 1105

## 2021-08-10 NOTE — ED Triage Notes (Addendum)
Pov from home. Cc of left knee pain after a scope yesterday.  ?Nancy [name] that her pain went from a 2 to a 10 today. And her while leg hurts.  ? ?Took pain meds at 430pm ?

## 2021-08-10 NOTE — Discharge Instructions (Addendum)
Resume taking your pain medication as directed starting tomorrow morning .  Follow-up with Dr. Ruthe Mannan office on Monday.  Return to emergency department for any new or worsening symptoms. ? ?It was a pleasure caring for you today in the emergency department. ? ?Please return to the emergency department for any worsening or worrisome symptoms. ? ?

## 2021-08-13 ENCOUNTER — Encounter (HOSPITAL_COMMUNITY): Payer: Self-pay | Admitting: Orthopedic Surgery

## 2021-08-13 DIAGNOSIS — M25562 Pain in left knee: Secondary | ICD-10-CM | POA: Diagnosis not present

## 2021-08-13 DIAGNOSIS — Z299 Encounter for prophylactic measures, unspecified: Secondary | ICD-10-CM | POA: Diagnosis not present

## 2021-08-13 DIAGNOSIS — Z87891 Personal history of nicotine dependence: Secondary | ICD-10-CM | POA: Diagnosis not present

## 2021-08-19 ENCOUNTER — Ambulatory Visit (INDEPENDENT_AMBULATORY_CARE_PROVIDER_SITE_OTHER): Payer: PPO | Admitting: Orthopedic Surgery

## 2021-08-19 ENCOUNTER — Other Ambulatory Visit: Payer: Self-pay

## 2021-08-19 DIAGNOSIS — S83242D Other tear of medial meniscus, current injury, left knee, subsequent encounter: Secondary | ICD-10-CM

## 2021-08-19 DIAGNOSIS — Z09 Encounter for follow-up examination after completed treatment for conditions other than malignant neoplasm: Secondary | ICD-10-CM

## 2021-08-19 NOTE — Progress Notes (Signed)
Chief Complaint  Patient presents with   Post-op Follow-up    Left, feeling good    Encounter Diagnoses  Name Primary?   Acute medial meniscus tear of left knee, subsequent encounter Yes   Postop check     Postop visit #1 status post left knee arthroscopy partial medial meniscectomy complicated by postop pain requiring ER visit  Looking at the notes and talking to the patient it may have been some type of L5 root irritation but now asymptomatic.  The sutures were removed portals look clean patient says she is doing well she can start quadriceps exercises follow-up as needed

## 2021-08-21 ENCOUNTER — Telehealth: Payer: Self-pay | Admitting: Adult Health

## 2021-08-22 NOTE — Telephone Encounter (Signed)
Called and spoke to HTA rep, [employer]. He states the Symbicort (med they called about) is not on the formulary and the pt initiated a PA. Advised him the pt is not on Symbicort but on Breztri. Sherron Ales states the Judithann Sauger is on the formulary. Casper Harrison to keep the pt on Breztri and discard the PA for Symbicort. Called pt and spoke with her about the Symbicort. Pt states she did not initiate a PA and does not want to be back on Symbicort. Advised pt all has been straightened out and to stay on Breztri. Advised pt to contact us if there are any issues that arise with her meds and HTA. Pt verbalized understanding and denied any further questions or concerns at this time.  ? ?

## 2021-08-23 DIAGNOSIS — J029 Acute pharyngitis, unspecified: Secondary | ICD-10-CM | POA: Diagnosis not present

## 2021-08-23 DIAGNOSIS — R519 Headache, unspecified: Secondary | ICD-10-CM | POA: Diagnosis not present

## 2021-08-23 DIAGNOSIS — R059 Cough, unspecified: Secondary | ICD-10-CM | POA: Diagnosis not present

## 2021-08-23 DIAGNOSIS — Z713 Dietary counseling and surveillance: Secondary | ICD-10-CM | POA: Diagnosis not present

## 2021-08-23 DIAGNOSIS — Z299 Encounter for prophylactic measures, unspecified: Secondary | ICD-10-CM | POA: Diagnosis not present

## 2021-08-26 ENCOUNTER — Ambulatory Visit: Payer: PPO | Admitting: Diagnostic Neuroimaging

## 2021-09-19 DIAGNOSIS — Z299 Encounter for prophylactic measures, unspecified: Secondary | ICD-10-CM | POA: Diagnosis not present

## 2021-09-19 DIAGNOSIS — Z1339 Encounter for screening examination for other mental health and behavioral disorders: Secondary | ICD-10-CM | POA: Diagnosis not present

## 2021-09-19 DIAGNOSIS — Z789 Other specified health status: Secondary | ICD-10-CM | POA: Diagnosis not present

## 2021-09-19 DIAGNOSIS — R5383 Other fatigue: Secondary | ICD-10-CM | POA: Diagnosis not present

## 2021-09-19 DIAGNOSIS — Z1331 Encounter for screening for depression: Secondary | ICD-10-CM | POA: Diagnosis not present

## 2021-09-19 DIAGNOSIS — E559 Vitamin D deficiency, unspecified: Secondary | ICD-10-CM | POA: Diagnosis not present

## 2021-09-19 DIAGNOSIS — E7800 Pure hypercholesterolemia, unspecified: Secondary | ICD-10-CM | POA: Diagnosis not present

## 2021-09-19 DIAGNOSIS — Z79899 Other long term (current) drug therapy: Secondary | ICD-10-CM | POA: Diagnosis not present

## 2021-09-19 DIAGNOSIS — Z0000 Encounter for general adult medical examination without abnormal findings: Secondary | ICD-10-CM | POA: Diagnosis not present

## 2021-09-19 DIAGNOSIS — Z7189 Other specified counseling: Secondary | ICD-10-CM | POA: Diagnosis not present

## 2021-09-19 DIAGNOSIS — Z6829 Body mass index (BMI) 29.0-29.9, adult: Secondary | ICD-10-CM | POA: Diagnosis not present

## 2021-10-03 DIAGNOSIS — Z6829 Body mass index (BMI) 29.0-29.9, adult: Secondary | ICD-10-CM | POA: Diagnosis not present

## 2021-10-03 DIAGNOSIS — Z299 Encounter for prophylactic measures, unspecified: Secondary | ICD-10-CM | POA: Diagnosis not present

## 2021-10-03 DIAGNOSIS — G43909 Migraine, unspecified, not intractable, without status migrainosus: Secondary | ICD-10-CM | POA: Diagnosis not present

## 2021-10-16 DIAGNOSIS — Z299 Encounter for prophylactic measures, unspecified: Secondary | ICD-10-CM | POA: Diagnosis not present

## 2021-10-16 DIAGNOSIS — Z6829 Body mass index (BMI) 29.0-29.9, adult: Secondary | ICD-10-CM | POA: Diagnosis not present

## 2021-10-16 DIAGNOSIS — M7552 Bursitis of left shoulder: Secondary | ICD-10-CM | POA: Diagnosis not present

## 2021-10-31 DIAGNOSIS — G43909 Migraine, unspecified, not intractable, without status migrainosus: Secondary | ICD-10-CM | POA: Insufficient documentation

## 2021-11-19 DIAGNOSIS — R739 Hyperglycemia, unspecified: Secondary | ICD-10-CM | POA: Diagnosis not present

## 2021-11-19 DIAGNOSIS — R5383 Other fatigue: Secondary | ICD-10-CM | POA: Diagnosis not present

## 2021-11-19 DIAGNOSIS — Z789 Other specified health status: Secondary | ICD-10-CM | POA: Diagnosis not present

## 2021-11-19 DIAGNOSIS — Z299 Encounter for prophylactic measures, unspecified: Secondary | ICD-10-CM | POA: Diagnosis not present

## 2021-12-03 ENCOUNTER — Other Ambulatory Visit: Payer: Self-pay | Admitting: Adult Health

## 2021-12-03 ENCOUNTER — Other Ambulatory Visit: Payer: Self-pay | Admitting: Diagnostic Neuroimaging

## 2021-12-23 DIAGNOSIS — H524 Presbyopia: Secondary | ICD-10-CM | POA: Diagnosis not present

## 2022-01-03 DIAGNOSIS — D1721 Benign lipomatous neoplasm of skin and subcutaneous tissue of right arm: Secondary | ICD-10-CM | POA: Diagnosis not present

## 2022-01-03 DIAGNOSIS — D224 Melanocytic nevi of scalp and neck: Secondary | ICD-10-CM | POA: Diagnosis not present

## 2022-01-03 DIAGNOSIS — D2272 Melanocytic nevi of left lower limb, including hip: Secondary | ICD-10-CM | POA: Diagnosis not present

## 2022-01-03 DIAGNOSIS — L814 Other melanin hyperpigmentation: Secondary | ICD-10-CM | POA: Diagnosis not present

## 2022-01-03 DIAGNOSIS — L821 Other seborrheic keratosis: Secondary | ICD-10-CM | POA: Diagnosis not present

## 2022-01-03 DIAGNOSIS — D2239 Melanocytic nevi of other parts of face: Secondary | ICD-10-CM | POA: Diagnosis not present

## 2022-01-03 DIAGNOSIS — L815 Leukoderma, not elsewhere classified: Secondary | ICD-10-CM | POA: Diagnosis not present

## 2022-01-03 DIAGNOSIS — D225 Melanocytic nevi of trunk: Secondary | ICD-10-CM | POA: Diagnosis not present

## 2022-01-03 DIAGNOSIS — L718 Other rosacea: Secondary | ICD-10-CM | POA: Diagnosis not present

## 2022-01-07 ENCOUNTER — Telehealth: Payer: Self-pay | Admitting: Orthopedic Surgery

## 2022-01-07 NOTE — Telephone Encounter (Signed)
Call received (voice message) requesting appointment. I returned call and reached voice mail; left message to return call regarding scheduling.

## 2022-01-09 DIAGNOSIS — R519 Headache, unspecified: Secondary | ICD-10-CM | POA: Diagnosis not present

## 2022-01-09 DIAGNOSIS — Z789 Other specified health status: Secondary | ICD-10-CM | POA: Diagnosis not present

## 2022-01-09 DIAGNOSIS — R52 Pain, unspecified: Secondary | ICD-10-CM | POA: Diagnosis not present

## 2022-01-09 DIAGNOSIS — Z299 Encounter for prophylactic measures, unspecified: Secondary | ICD-10-CM | POA: Diagnosis not present

## 2022-01-14 ENCOUNTER — Telehealth (INDEPENDENT_AMBULATORY_CARE_PROVIDER_SITE_OTHER): Payer: PPO | Admitting: Diagnostic Neuroimaging

## 2022-01-14 ENCOUNTER — Encounter: Payer: Self-pay | Admitting: Diagnostic Neuroimaging

## 2022-01-14 DIAGNOSIS — G250 Essential tremor: Secondary | ICD-10-CM | POA: Diagnosis not present

## 2022-01-14 DIAGNOSIS — G43009 Migraine without aura, not intractable, without status migrainosus: Secondary | ICD-10-CM

## 2022-01-14 MED ORDER — RIZATRIPTAN BENZOATE 10 MG PO TBDP: 10.0000 mg | ORAL_TABLET | ORAL | 11 refills | Status: DC | PRN

## 2022-01-14 MED ORDER — TOPIRAMATE 50 MG PO TABS: 50.0000 mg | ORAL_TABLET | Freq: Two times a day (BID) | ORAL | 6 refills | Status: DC

## 2022-01-14 NOTE — Progress Notes (Signed)
[facility]  PATIENT: Nancy [name] DOB: Jul 04, 1965  REFERRING CLINICIAN: Glenda Chroman, MD HISTORY FROM: patient  REASON FOR VISIT:  follow up   HISTORICAL  CHIEF COMPLAINT:  Chief Complaint  Patient presents with   Tremors    HISTORY OF PRESENT ILLNESS:   UPDATE (01/14/22, VRP): Since last visit, more tremors. More migraines (left sided, sens to light, nausea, cant sleep). Similar to prior migraine (since teenage years). Now worse in last 3 months.   PRIOR HPI: 56 year old female here for evaluation of tremor.  Has had postural tremor since at least 2015, previously on primidone.  She does not remember taking this medicine in the past but I can see it on her prior neurology evaluation notes from Dr. Tomi Likens and Dr. Jannifer Franklin.   Symptoms worsening in the last few months.  Symptoms affecting her day-to-day activities and quality of life.  No significant triggering or aggravating factors.  No alleviating factors.   REVIEW OF SYSTEMS: Full 14 system review of systems performed and negative with exception of: as per HPI.  ALLERGIES: Allergies  Allergen Reactions   Cymbalta [Duloxetine Hcl] Other (See Comments)    serotonin syndrome   Divalproex Sodium Other (See Comments)    unknown   Effexor [Venlafaxine Hydrochloride] Other (See Comments)    serotonin syndrome   Geodon [Ziprasidone Hydrochloride] Other (See Comments)    Hands and feet tingling.   Prozac [Fluoxetine Hcl] Other (See Comments)    suicidal   Thorazine [Chlorpromazine Hcl] Other (See Comments)    unknown   Cephalexin Rash    Joint swelling   Cephalosporins Rash    Joint swelling   Keflex   Codeine Nausea Only    Pt states must take phenergan with codeine.       Morphine Nausea Only        Theo-Dur [Theophylline] Palpitations   Ziprasidone Nausea Only and Other (See Comments)    Hands and feet tingling.    HOME MEDICATIONS: Outpatient Medications Prior to Visit  Medication  Sig Dispense Refill   albuterol (PROVENTIL) (2.5 MG/3ML) 0.083% nebulizer solution Take 2.5 mg by nebulization every 6 (six) hours as needed for shortness of breath or wheezing.     Ascorbic Acid (VITAMIN C) 1000 MG tablet Take 1,000 mg by mouth daily.     atorvastatin (LIPITOR) 10 MG tablet Take 10 mg by mouth daily.     benzonatate (TESSALON) 200 MG capsule TAKE 1 CAPSULE BY MOUTH 3 TIMES DAILY AS NEEDED FOR COUGH **NOT COVERED 45 capsule 1   BLACK ELDERBERRY PO Take 1 each by mouth daily. Elderberry gummy     Budeson-Glycopyrrol-Formoterol (BREZTRI AEROSPHERE) 160-9-4.8 MCG/ACT AERO Inhale 2 puffs into the lungs in the morning and at bedtime. 10.7 g 3   buPROPion (WELLBUTRIN XL) 300 MG 24 hr tablet Take 300 mg by mouth every morning.     carbamazepine (TEGRETOL) 100 MG chewable tablet Chew 400 mg by mouth at bedtime.     cetirizine (ZYRTEC) 10 MG tablet Take 10 mg by mouth daily.     Cholecalciferol (VITAMIN D3) 50 MCG (2000 UT) capsule Take 2,000 Units by mouth daily.     Cyanocobalamin (VITAMIN B-12) 2500 MCG SUBL Place 2,500 mcg under the tongue daily.     docusate sodium (COLACE) 50 MG capsule Take 150 mg by mouth 2 (two) times daily.     EPINEPHrine 0.3 mg/0.3 mL IJ SOAJ injection Inject 0.3 mg into the muscle as needed for anaphylaxis.  lamoTRIgine (LAMICTAL) 150 MG tablet Take 300 mg by mouth at bedtime.     meloxicam (MOBIC) 15 MG tablet Take 15 mg by mouth daily.     Mepolizumab (NUCALA) 100 MG/ML SOAJ Inject 1 mL (100 mg total) into the skin every 28 (twenty-eight) days. 3 mL 2   montelukast (SINGULAIR) 10 MG tablet Take 10 mg by mouth at bedtime.     pantoprazole (PROTONIX) 40 MG tablet Take 40 mg by mouth daily.     primidone (MYSOLINE) 50 MG tablet TAKE 1 TABLET (50 MG TOTAL) BY MOUTH IN THE MORNING AND AT BEDTIME. 180 tablet 1   promethazine (PHENERGAN) 12.5 MG tablet Take 1 tablet (12.5 mg total) by mouth every 6 (six) hours as needed for nausea or vomiting. 30 tablet 0    zinc gluconate 50 MG tablet Take 50 mg by mouth daily.     No facility-administered medications prior to visit.    PAST MEDICAL HISTORY: Past Medical History:  Diagnosis Date   Allergic rhinitis    Asthma    Benign familial tremor    Bipolar 1 disorder (HCC)    Chronic neck and back pain    Degenerative disk disease    DJD (degenerative joint disease), lumbar    Dyslipidemia    Fibromyalgia    GERD (gastroesophageal reflux disease)    Hiatal hernia    History of kidney stones    Irritable bowel syndrome (IBS)    Memory disorder 07/18/2014   Memory loss    Migraine headache    Nephrolithiasis    Pneumonia 1986   Serotonin syndrome    Tremor    hands,arms    PAST SURGICAL HISTORY: Past Surgical History:  Procedure Laterality Date   ANKLE FRACTURE SURGERY  2006   Right   APPENDECTOMY     back fusion     BACK SURGERY     BRONCHOSCOPY     S. aureus from BAL   CARDIAC CATHETERIZATION  11/27/2006   minimal CAD   CHOLECYSTECTOMY     COLONOSCOPY WITH PROPOFOL N/A 12/12/2020   Procedure: COLONOSCOPY WITH PROPOFOL;  Surgeon: Rogene Houston, MD;  Location: [facility];  Service: Endoscopy;  Laterality: N/A;  820   ESOPHAGEAL DILATION  08/06/2004   KNEE ARTHROSCOPY WITH MEDIAL MENISECTOMY Left 08/09/2021   Procedure: KNEE ARTHROSCOPY WITH PARTIAL MEDIAL MENISCECTOMY;  Surgeon: Carole Civil, MD;  Location: [facility];  Service: Orthopedics;  Laterality: Left;   LAPAROSCOPIC CHOLECYSTECTOMY     LASER ABLATION OF THE CERVIX     MICRODISSECTION L5-S1  08/13/2000   neck fusion     TONSILLECTOMY      FAMILY HISTORY: Family History  Problem Relation Age of Onset   Allergies Mother    Clotting disorder Mother    Diabetes Mother    Allergies Father    Heart disease Father    Heart attack Father    Diabetes Father    Allergies Brother    Chorea Maternal Grandmother     SOCIAL HISTORY: Social History   Socioeconomic History   Marital status: Married    Spouse  name: Not on file   Number of children: 1   Years of education: HS   Highest education level: Not on file  Occupational History   Occupation: disabled    Employer: UNEMPLOYED  Tobacco Use   Smoking status: Former    Packs/day: 0.50    Years: 8.00    Total pack years: 4.00  Types: Cigarettes    Quit date: 06/09/1978    Years since quitting: 43.6   Smokeless tobacco: Never  Vaping Use   Vaping Use: Never used  Substance and Sexual Activity   Alcohol use: No   Drug use: No   Sexual activity: Not on file  Other Topics Concern   Not on file  Social History Narrative   Patient is right handed.   Patient does not drink caffeine.   Social Determinants of Health   Financial Resource Strain: Not on file  Food Insecurity: Not on file  Transportation Needs: Not on file  Physical Activity: Not on file  Stress: Not on file  Social Connections: Not on file  Intimate Partner Violence: Not on file     PHYSICAL EXAM  Video visit    DIAGNOSTIC DATA (LABS, IMAGING, TESTING) - I reviewed patient records, labs, notes, testing and imaging myself where available.  Lab Results  Component Value Date   WBC 5.7 08/06/2021   HGB 14.2 08/06/2021   HCT 42.5 08/06/2021   MCV 93.8 08/06/2021   PLT 285 08/06/2021      Component Value Date/Time   NA 137 08/06/2021 1457   K 3.8 08/06/2021 1457   CL 105 08/06/2021 1457   CO2 23 08/06/2021 1457   GLUCOSE 124 (H) 08/06/2021 1457   BUN 25 (H) 08/06/2021 1457   CREATININE 0.74 08/06/2021 1457   CALCIUM 9.2 08/06/2021 1457   PROT 7.0 06/29/2010 1353   ALBUMIN 4.1 06/29/2010 1353   AST 27 06/29/2010 1353   ALT 24 06/29/2010 1353   ALKPHOS 68 06/29/2010 1353   BILITOT 0.4 06/29/2010 1353   GFRNONAA >60 08/06/2021 1457   GFRAA  06/29/2010 1353    >60        The eGFR has been calculated using the MDRD equation. This calculation has not been validated in all clinical situations. eGFR's persistently <60 mL/min signify possible  Chronic Kidney Disease.   No results found for: "CHOL", "HDL", "LDLCALC", "LDLDIRECT", "TRIG", "CHOLHDL" Lab Results  Component Value Date   HGBA1C (H) 11/28/2006    6.7 (NOTE)   The ADA recommends the following therapeutic goals for glycemic   control related to Hgb A1C measurement:   Goal of Therapy:   < 7.0% Hgb A1C   Action Suggested:  > 8.0% Hgb A1C   Ref:  Diabetes Care, 22, Suppl. 1, 1999   Lab Results  Component Value Date   INOMVEHM09 470 06/14/2013   No results found for: "TSH"   05/12/14 MRI brain - unremarkable   ASSESSMENT AND PLAN  56 y.o. year old female here with postural tremor, with family history of tremor in her mother, most consistent with essential tremor.   Dx:  1. Essential tremor   2. Migraine without aura and without status migrainosus, not intractable       PLAN:  ESSENTIAL TREMOR - primidone 37m twice a day   MIGRAINE WITHOUT AURA - start topiramate 51mtwice a day for migraine and tremor control - start rizatriptan as needed for breakthrough headaches     Meds ordered this encounter  Medications   topiramate (TOPAMAX) 50 MG tablet    Sig: Take 1 tablet (50 mg total) by mouth 2 (two) times daily.    Dispense:  60 tablet    Refill:  6   rizatriptan (MAXALT-MLT) 10 MG disintegrating tablet    Sig: Take 1 tablet (10 mg total) by mouth as needed for migraine. May repeat in  2 hours if needed    Dispense:  9 tablet    Refill:  11   Return in about 6 weeks (around 02/25/2022) for with NP Frann Rider).   Virtual Visit via Video Note  I connected withNAME@ on 01/14/22 at  2:15 PM EDT by a video enabled telemedicine application and verified that I am speaking with the correct person using two identifiers.   I discussed the limitations of evaluation and management by telemedicine and the availability of in person appointments. The patient expressed understanding and agreed to proceed.  Patient is at home and I am at the office.   I  spent 15 minutes of face-to-face and non-face-to-face time with patient.  This included previsit chart review, lab review, study review, order entry, electronic health record documentation, patient education.      Penni Bombard, MD 07/17/9789, 5:04 PM Certified in Neurology, Neurophysiology and Neuroimaging  [facility] [address] [place], [place] [address] [phone]

## 2022-01-20 DIAGNOSIS — Z1231 Encounter for screening mammogram for malignant neoplasm of breast: Secondary | ICD-10-CM | POA: Diagnosis not present

## 2022-01-23 ENCOUNTER — Ambulatory Visit: Payer: PPO | Admitting: Orthopedic Surgery

## 2022-01-23 ENCOUNTER — Encounter: Payer: Self-pay | Admitting: Orthopedic Surgery

## 2022-01-23 DIAGNOSIS — M25561 Pain in right knee: Secondary | ICD-10-CM

## 2022-01-23 DIAGNOSIS — G8929 Other chronic pain: Secondary | ICD-10-CM | POA: Diagnosis not present

## 2022-01-23 DIAGNOSIS — M25562 Pain in left knee: Secondary | ICD-10-CM | POA: Diagnosis not present

## 2022-01-23 DIAGNOSIS — Z9889 Other specified postprocedural states: Secondary | ICD-10-CM

## 2022-01-23 MED ORDER — METHYLPREDNISOLONE ACETATE 40 MG/ML IJ SUSP
40.0000 mg | Freq: Once | INTRAMUSCULAR | Status: AC
  Administered 2022-01-23: 40 mg via INTRA_ARTICULAR

## 2022-01-23 MED ORDER — PREDNISONE 10 MG PO TABS
10.0000 mg | ORAL_TABLET | Freq: Three times a day (TID) | ORAL | 0 refills | Status: DC
Start: 2022-01-23 — End: 2022-02-13

## 2022-01-23 MED ORDER — METHYLPREDNISOLONE ACETATE 40 MG/ML IJ SUSP
40.0000 mg | Freq: Once | INTRAMUSCULAR | Status: AC
Start: 2022-01-23 — End: 2022-01-23
  Administered 2022-01-23: 40 mg via INTRA_ARTICULAR

## 2022-01-23 NOTE — Progress Notes (Signed)
FOLLOW UP   Encounter Diagnoses  Name Primary?   Chronic pain of right knee Yes   Chronic pain of left knee    S/P arthroscopy of left knee 08/09/21      Chief Complaint  Patient presents with   Knee Pain    Both painful     56 year old female status post arthroscopy left knee with bilateral knee pain which is chronic comes in with acute exacerbation of pain in both knees and scheduled to go to [place]  Patient request both knees be injected which is okay  Procedure note for bilateral knee injections  Procedure note left knee injection verbal consent was obtained to inject left knee joint  Timeout was completed to confirm the site of injection  The medications used were 40 mg depomedrol and 3 cc of 1% lidocaine  Anesthesia was provided by ethyl chloride and the skin was prepped with alcohol.  After cleaning the skin with alcohol a 20-gauge needle was used to inject the left knee joint. There were no complications. A sterile bandage was applied.   Procedure note right knee injection verbal consent was obtained to inject right knee joint  Timeout was completed to confirm the site of injection  The medications used were 40 mg depomedrol and 3 cc of 1% lidocaine  Anesthesia was provided by ethyl chloride and the skin was prepped with alcohol.  After cleaning the skin with alcohol a 20-gauge needle was used to inject the right knee joint. There were no complications. A sterile bandage was applied.    Meds ordered this encounter  Medications   methylPREDNISolone acetate (DEPO-MEDROL) injection 40 mg   methylPREDNISolone acetate (DEPO-MEDROL) injection 40 mg   predniSONE (DELTASONE) 10 MG tablet    Sig: Take 1 tablet (10 mg total) by mouth 3 (three) times daily.    Dispense:  42 tablet    Refill:  0

## 2022-01-24 DIAGNOSIS — R928 Other abnormal and inconclusive findings on diagnostic imaging of breast: Secondary | ICD-10-CM | POA: Diagnosis not present

## 2022-01-24 DIAGNOSIS — N6012 Diffuse cystic mastopathy of left breast: Secondary | ICD-10-CM | POA: Diagnosis not present

## 2022-02-13 ENCOUNTER — Encounter: Payer: Self-pay | Admitting: Pulmonary Disease

## 2022-02-13 ENCOUNTER — Ambulatory Visit: Payer: PPO | Admitting: Pulmonary Disease

## 2022-02-13 ENCOUNTER — Telehealth: Payer: Self-pay

## 2022-02-13 VITALS — BP 122/72 | HR 90 | Temp 98.1°F | Ht 64.0 in | Wt 169.8 lb

## 2022-02-13 DIAGNOSIS — J4550 Severe persistent asthma, uncomplicated: Secondary | ICD-10-CM | POA: Diagnosis not present

## 2022-02-13 DIAGNOSIS — R911 Solitary pulmonary nodule: Secondary | ICD-10-CM

## 2022-02-13 MED ORDER — NUCALA 100 MG/ML ~~LOC~~ SOAJ: 100.0000 mg | SUBCUTANEOUS | 5 refills | Status: DC

## 2022-02-13 NOTE — Telephone Encounter (Signed)
Refill for Nucala faxed to [facility] pt assistance program  Dose: '100mg'$  SQ every 4 weeks  Fax: [phone] Phone: [phone]  Knox Saliva, PharmD, MPH, BCPS, CPP Clinical Pharmacist (Rheumatology and Pulmonology)

## 2022-02-13 NOTE — Telephone Encounter (Signed)
This patients nucala prescription needs to be renewed. Pharmacy team please advise, is there anything clinical staff or Dr. Halford Chessman needs to do on our end to have nucala continued?

## 2022-02-13 NOTE — Patient Instructions (Signed)
Will schedule follow up after you have CT chest in January 2024

## 2022-02-13 NOTE — Progress Notes (Signed)
[facility]  Chief Complaint  Patient presents with   Follow-up    Feels like she is doing ok since last  ov.     Past Surgical History:  She  has a past surgical history that includes Laparoscopic cholecystectomy; MICRODISSECTION L5-S1 (08/13/2000); Appendectomy; Tonsillectomy; back fusion; Ankle fracture surgery (2006); Esophageal dilation (08/06/2004); Cardiac catheterization (11/27/2006); Bronchoscopy; Laser ablation of the cervix; Back surgery; neck fusion; Colonoscopy with propofol (N/A, 12/12/2020); Cholecystectomy; and Knee arthroscopy with medial menisectomy (Left, 08/09/2021).  Past Medical History:  Serotonin Syndrome, PNA 1986, Nephrolithiasis, Migraine HA, Memory loss, GERD with HH, Fibromyalgia, HLD, DJD, Bipolar, Tremor  Constitutional:  BP 122/72 (BP Location: Right Arm, Patient Position: Sitting)   Pulse 90   Temp 98.1 F (36.7 C) (Temporal)   Ht '5\' 4"'$  (1.626 m)   Wt 169 lb 12.8 oz (77 kg)   SpO2 97% Comment: ra  BMI 29.15 kg/m   Brief Summary:  Nancy [name] is a 56 y.o. female former smoker with eosinophilic asthma and lung nodule.       Subjective:   I last saw her in 2020.    She was switched from fasenra to nucala.  Had flare in August and needed prednisone.  Better since she started breztri.  Has sinus congestion in the morning.  Sleeping okay.  Not having wheeze.  Gets winded when she goes for her walk  Physical Exam:   Appearance - well kempt   ENMT - no sinus tenderness, no oral exudate, no LAN, Mallampati 3 airway, no stridor  Respiratory - equal breath sounds bilaterally, no wheezing or rales  CV - s1s2 regular rate and rhythm, no murmurs  Ext - no clubbing, no edema  Skin - no rashes  Psych - normal mood and affect   Pulmonary testing:  Spirometry 03/05/10>>FEV1 2.43(87%), FEV1% 93 IgE 2008>>412 IgE 04/01/11>>162.1 Xolair from 11/23/07 to 03/05/10>>stopped due to insurance issues RAST  04/01/11>>Cat dander, dog dander, dust mites PFT 05/28/11>>FEV1 3.01(112%), FEV1% 78, TLC 5.64(112%), RV 2.28(135%), DLCO 99%, positive bronchodilator response. December 2012 >> resumed xolair >> stopped Summer 2014 due to finances >> resumed September 2014 January 2016 >> xolair d/c'ed due to finances FeNO 08/19/16 >> 10 Spirometry 08/19/16 >> FEV1 2.49 (84%), FEV1% 79 Labs 08/19/16 >> IgE 120 PFT 07/01/21 >> FEV1 2.40 (84%), FEV1% 80, TLC 7.88 (151%), DLCO 107%, +BD  Chest Imaging:  CT chest 06/27/21 >> LLL 5 mm nodule, scattered ATX and scarring  Sleep Tests:    Cardiac Tests:    Social History:  She  reports that she quit smoking about 43 years ago. Her smoking use included cigarettes. She has a 4.00 pack-year smoking history. She has never used smokeless tobacco. She reports that she does not drink alcohol and does not use drugs.  Family History:  Her family history includes Allergies in her brother, father, and mother; Chorea in her maternal grandmother; Clotting disorder in her mother; Diabetes in her father and mother; Heart attack in her father; Heart disease in her father.     Assessment/Plan:   Severe persistent asthma with eosinophilic phenotype. - continue nucala - continue breztri, singulair - advised her to try using albuterol before she goes for her walk - prn albuterol - she has a nebulizer machine  Allergic rhinitis. - continue zyrtec, singulair  Lung nodule. - follow up CT chest without contrast in January 2024  Time Spent Involved in Patient Care on Day of Examination:  29  minutes  Follow up:   Patient Instructions  Will schedule follow up after you have CT chest in January 2024  Medication List:   Allergies as of 02/13/2022       Reactions   Cymbalta [duloxetine Hcl] Other (See Comments)   serotonin syndrome   Divalproex Sodium Other (See Comments)   unknown   Effexor [venlafaxine Hydrochloride] Other (See Comments)   serotonin syndrome    Geodon [ziprasidone Hydrochloride] Other (See Comments)   Hands and feet tingling.   Prozac [fluoxetine Hcl] Other (See Comments)   suicidal   Thorazine [chlorpromazine Hcl] Other (See Comments)   unknown   Cephalexin Rash   Joint swelling   Cephalosporins Rash   Joint swelling  Keflex   Codeine Nausea Only   Pt states must take phenergan with codeine.      Morphine Nausea Only      Theo-dur [theophylline] Palpitations   Ziprasidone Nausea Only, Other (See Comments)   Hands and feet tingling.        Medication List        Accurate as of February 13, 2022 10:57 AM. If you have any questions, ask your nurse or doctor.          STOP taking these medications    predniSONE 10 MG tablet Commonly known as: DELTASONE Stopped by: Chesley Mires, MD       TAKE these medications    albuterol (2.5 MG/3ML) 0.083% nebulizer solution Commonly known as: PROVENTIL Take 2.5 mg by nebulization every 6 (six) hours as needed for shortness of breath or wheezing.   atorvastatin 10 MG tablet Commonly known as: LIPITOR Take 10 mg by mouth daily.   BLACK ELDERBERRY PO Take 1 each by mouth daily. Elderberry gummy   Breztri Aerosphere 160-9-4.8 MCG/ACT Aero Generic drug: Budeson-Glycopyrrol-Formoterol Inhale 2 puffs into the lungs in the morning and at bedtime.   buPROPion 300 MG 24 hr tablet Commonly known as: WELLBUTRIN XL Take 300 mg by mouth every morning.   carbamazepine 100 MG chewable tablet Commonly known as: TEGRETOL Chew 400 mg by mouth at bedtime.   cetirizine 10 MG tablet Commonly known as: ZYRTEC Take 10 mg by mouth daily.   docusate sodium 50 MG capsule Commonly known as: COLACE Take 150 mg by mouth 2 (two) times daily.   EPINEPHrine 0.3 mg/0.3 mL Soaj injection Commonly known as: EPI-PEN Inject 0.3 mg into the muscle as needed for anaphylaxis.   lamoTRIgine 150 MG tablet Commonly known as: LAMICTAL Take 300 mg by mouth at bedtime.   meloxicam 15 MG  tablet Commonly known as: MOBIC Take 15 mg by mouth daily.   montelukast 10 MG tablet Commonly known as: SINGULAIR Take 10 mg by mouth at bedtime.   Nucala 100 MG/ML Soaj Generic drug: Mepolizumab Inject 1 mL (100 mg total) into the skin every 28 (twenty-eight) days.   pantoprazole 40 MG tablet Commonly known as: PROTONIX Take 40 mg by mouth daily.   primidone 50 MG tablet Commonly known as: MYSOLINE TAKE 1 TABLET (50 MG TOTAL) BY MOUTH IN THE MORNING AND AT BEDTIME.   promethazine 12.5 MG tablet Commonly known as: PHENERGAN Take 1 tablet (12.5 mg total) by mouth every 6 (six) hours as needed for nausea or vomiting.   rizatriptan 10 MG disintegrating tablet Commonly known as: MAXALT-MLT Take 1 tablet (10 mg total) by mouth as needed for migraine. May repeat in 2 hours if needed   topiramate 50 MG tablet Commonly known as: Topamax  Take 1 tablet (50 mg total) by mouth 2 (two) times daily.   Vitamin B-12 2500 MCG Subl Place 2,500 mcg under the tongue daily.   vitamin C 1000 MG tablet Take 1,000 mg by mouth daily.   Vitamin D3 50 MCG (2000 UT) capsule Take 2,000 Units by mouth daily.   zinc gluconate 50 MG tablet Take 50 mg by mouth daily.        Signature:  Chesley Mires, MD [place] Pager - [phone] 02/13/2022, 10:57 AM

## 2022-02-28 DIAGNOSIS — J45909 Unspecified asthma, uncomplicated: Secondary | ICD-10-CM | POA: Diagnosis not present

## 2022-02-28 DIAGNOSIS — Z299 Encounter for prophylactic measures, unspecified: Secondary | ICD-10-CM | POA: Diagnosis not present

## 2022-02-28 DIAGNOSIS — R070 Pain in throat: Secondary | ICD-10-CM | POA: Diagnosis not present

## 2022-02-28 DIAGNOSIS — J069 Acute upper respiratory infection, unspecified: Secondary | ICD-10-CM | POA: Diagnosis not present

## 2022-03-06 DIAGNOSIS — R7303 Prediabetes: Secondary | ICD-10-CM | POA: Diagnosis not present

## 2022-03-06 DIAGNOSIS — Z713 Dietary counseling and surveillance: Secondary | ICD-10-CM | POA: Diagnosis not present

## 2022-03-06 DIAGNOSIS — F319 Bipolar disorder, unspecified: Secondary | ICD-10-CM | POA: Diagnosis not present

## 2022-03-06 DIAGNOSIS — Z6830 Body mass index (BMI) 30.0-30.9, adult: Secondary | ICD-10-CM | POA: Diagnosis not present

## 2022-03-06 DIAGNOSIS — Z299 Encounter for prophylactic measures, unspecified: Secondary | ICD-10-CM | POA: Diagnosis not present

## 2022-03-06 DIAGNOSIS — Z0000 Encounter for general adult medical examination without abnormal findings: Secondary | ICD-10-CM | POA: Diagnosis not present

## 2022-03-10 ENCOUNTER — Ambulatory Visit
Admission: EM | Admit: 2022-03-10 | Discharge: 2022-03-10 | Disposition: A | Discharge status: Home / Self Care | Payer: PPO | Attending: Family Medicine | Admitting: Family Medicine

## 2022-03-10 ENCOUNTER — Ambulatory Visit (INDEPENDENT_AMBULATORY_CARE_PROVIDER_SITE_OTHER): Payer: PPO

## 2022-03-10 DIAGNOSIS — S92505A Nondisplaced unspecified fracture of left lesser toe(s), initial encounter for closed fracture: Secondary | ICD-10-CM

## 2022-03-10 DIAGNOSIS — M79675 Pain in left toe(s): Secondary | ICD-10-CM | POA: Diagnosis not present

## 2022-03-10 DIAGNOSIS — S92515A Nondisplaced fracture of proximal phalanx of left lesser toe(s), initial encounter for closed fracture: Secondary | ICD-10-CM | POA: Diagnosis not present

## 2022-03-10 NOTE — ED Provider Notes (Signed)
[facility]    CSN: [id number] Arrival date & time: 03/10/22  1315      History   Chief Complaint Chief Complaint  Patient presents with   Toe Injury    HPI Nancy [name] is a 56 y.o. female.   Patient presenting today with bruising, swelling, decreased range of motion to the left fifth toe after kicking a chair on accident earlier today.  She denies numbness, tingling, skin injury, inability to bear weight on the foot.  So far not trying anything over-the-counter for symptoms.    Past Medical History:  Diagnosis Date   Allergic rhinitis    Asthma    Benign familial tremor    Bipolar 1 disorder (HCC)    Chronic neck and back pain    Degenerative disk disease    DJD (degenerative joint disease), lumbar    Dyslipidemia    Fibromyalgia    GERD (gastroesophageal reflux disease)    Hiatal hernia    History of kidney stones    Irritable bowel syndrome (IBS)    Memory disorder 07/18/2014   Memory loss    Migraine headache    Nephrolithiasis    Pneumonia 1986   Serotonin syndrome    Tremor    hands,arms    Patient Active Problem List   Diagnosis Date Noted   Acute medial meniscus tear of left knee    GERD (gastroesophageal reflux disease) 06/13/2021   Allergic rhinitis 12/30/2019   Preoperative clearance 11/09/2018   Memory disorder 07/18/2014   Chest pain of uncertain etiology 56/38/9373   Severe persistent asthma with acute bronchitis and acute exacerbation 03/04/2013   Exercise-induced asthma 07/30/2012   Severe persistent asthma in adult without complication 42/87/6811    Past Surgical History:  Procedure Laterality Date   ANKLE FRACTURE SURGERY  2006   Right   APPENDECTOMY     back fusion     BACK SURGERY     BRONCHOSCOPY     S. aureus from BAL   CARDIAC CATHETERIZATION  11/27/2006   minimal CAD   CHOLECYSTECTOMY     COLONOSCOPY WITH PROPOFOL N/A 12/12/2020   Procedure: COLONOSCOPY WITH PROPOFOL;  Surgeon: Rogene Houston, MD;   Location: [facility];  Service: Endoscopy;  Laterality: N/A;  820   ESOPHAGEAL DILATION  08/06/2004   KNEE ARTHROSCOPY WITH MEDIAL MENISECTOMY Left 08/09/2021   Procedure: KNEE ARTHROSCOPY WITH PARTIAL MEDIAL MENISCECTOMY;  Surgeon: Carole Civil, MD;  Location: [facility];  Service: Orthopedics;  Laterality: Left;   LAPAROSCOPIC CHOLECYSTECTOMY     LASER ABLATION OF THE CERVIX     MICRODISSECTION L5-S1  08/13/2000   neck fusion     TONSILLECTOMY      OB History   No obstetric history on file.      Home Medications    Prior to Admission medications   Medication Sig Start Date End Date Taking? Authorizing Provider  albuterol (PROVENTIL) (2.5 MG/3ML) 0.083% nebulizer solution Take 2.5 mg by nebulization every 6 (six) hours as needed for shortness of breath or wheezing.    [provider]  Ascorbic Acid (VITAMIN C) 1000 MG tablet Take 1,000 mg by mouth daily.    [provider]  atorvastatin (LIPITOR) 10 MG tablet Take 10 mg by mouth daily.    [provider]  BLACK ELDERBERRY PO Take 1 each by mouth daily. Elderberry gummy    [provider]  Budeson-Glycopyrrol-Formoterol (BREZTRI AEROSPHERE) 160-9-4.8 MCG/ACT AERO Inhale 2 puffs into the  lungs in the morning and at bedtime. 06/13/21   Cobb, Karie Schwalbe, NP  buPROPion (WELLBUTRIN XL) 300 MG 24 hr tablet Take 300 mg by mouth every morning. 11/20/20   [provider]  carbamazepine (TEGRETOL) 100 MG chewable tablet Chew 400 mg by mouth at bedtime. 11/23/20   [provider]  cetirizine (ZYRTEC) 10 MG tablet Take 10 mg by mouth daily.    [provider]  Cholecalciferol (VITAMIN D3) 50 MCG (2000 UT) capsule Take 2,000 Units by mouth daily.    [provider]  Cyanocobalamin (VITAMIN B-12) 2500 MCG SUBL Place 2,500 mcg under the tongue daily.    [provider]  docusate sodium (COLACE) 50 MG capsule Take 150 mg by mouth 2 (two) times daily.    [provider]  EPINEPHrine 0.3 mg/0.3 mL IJ SOAJ injection Inject 0.3 mg into the muscle as needed for anaphylaxis.    [provider]  lamoTRIgine (LAMICTAL) 150 MG tablet Take 300 mg by mouth at bedtime.    [provider]  meloxicam (MOBIC) 15 MG tablet Take 15 mg by mouth daily. 05/19/20   [provider]  Mepolizumab (NUCALA) 100 MG/ML SOAJ Inject 1 mL (100 mg total) into the skin every 28 (twenty-eight) days. 02/13/22   Chesley Mires, MD  montelukast (SINGULAIR) 10 MG tablet Take 10 mg by mouth at bedtime.    [provider]  pantoprazole (PROTONIX) 40 MG tablet Take 40 mg by mouth daily.    [provider]  primidone (MYSOLINE) 50 MG tablet TAKE 1 TABLET (50 MG TOTAL) BY MOUTH IN THE MORNING AND AT BEDTIME. 12/03/21   Penumalli, Earlean Polka, MD  promethazine (PHENERGAN) 12.5 MG tablet Take 1 tablet (12.5 mg total) by mouth every 6 (six) hours as needed for nausea or vomiting. 08/09/21   Carole Civil, MD  rizatriptan (MAXALT-MLT) 10 MG disintegrating tablet Take 1 tablet (10 mg total) by mouth as needed for migraine. May repeat in 2 hours if needed 01/14/22   Penumalli, Earlean Polka, MD  topiramate (TOPAMAX) 50 MG tablet Take 1 tablet (50 mg total) by mouth 2 (two) times daily. 01/14/22   Penumalli, Earlean Polka, MD  zinc gluconate 50 MG tablet Take 50 mg by mouth daily.    [provider]    Family History Family History  Problem Relation Age of Onset   Allergies Mother    Clotting disorder Mother    Diabetes Mother    Allergies Father    Heart disease Father    Heart attack Father    Diabetes Father    Allergies Brother    Chorea Maternal Grandmother     Social History Social History   Tobacco Use   Smoking status: Former    Packs/day: 0.50    Years: 8.00    Total pack years: 4.00    Types: Cigarettes    Quit date: 06/09/1978    Years since quitting: 43.7   Smokeless tobacco: Never  Vaping Use   Vaping Use: Never used  Substance  Use Topics   Alcohol use: No   Drug use: No     Allergies   Cymbalta [duloxetine hcl], Divalproex sodium, Effexor [venlafaxine hydrochloride], Geodon [ziprasidone hydrochloride], Prozac [fluoxetine hcl], Thorazine [chlorpromazine hcl], Cephalexin, Cephalosporins, Codeine, Morphine, Theo-dur [theophylline], and Ziprasidone   Review of Systems Review of Systems Per HPI  Physical Exam Triage Vital Signs ED Triage Vitals  Enc Vitals Group     BP 03/10/22 1344 134/82  Pulse Rate 03/10/22 1344 85     Resp 03/10/22 1344 20     Temp 03/10/22 1344 98 F (36.7 C)     Temp src --      SpO2 03/10/22 1344 98 %     Weight --      Height --      Head Circumference --      Peak Flow --      Pain Score 03/10/22 1342 8     Pain Loc --      Pain Edu? --      Excl. in [place]? --    No data found.  Updated Vital Signs BP 134/82   Pulse 85   Temp 98 F (36.7 C)   Resp 20   SpO2 98%   Visual Acuity Right Eye Distance:   Left Eye Distance:   Bilateral Distance:    Right Eye Near:   Left Eye Near:    Bilateral Near:     Physical Exam Vitals and nursing note reviewed.  Constitutional:      Appearance: Normal appearance. She is not ill-appearing.  HENT:     Head: Atraumatic.  Eyes:     Extraocular Movements: Extraocular movements intact.     Conjunctiva/sclera: Conjunctivae normal.  Cardiovascular:     Rate and Rhythm: Normal rate and regular rhythm.     Heart sounds: Normal heart sounds.  Pulmonary:     Effort: Pulmonary effort is normal.     Breath sounds: Normal breath sounds.  Musculoskeletal:        General: Swelling, tenderness and signs of injury present. No deformity. Normal range of motion.     Cervical back: Normal range of motion and neck supple.     Comments: Base of left fifth toe bruised, edematous, tender to palpation.  Decreased range of motion in this toe  Skin:    General: Skin is warm and dry.     Findings: Bruising present.     Comments: Base of left  fifth toe bruised, edematous  Neurological:     Mental Status: She is alert and oriented to person, place, and time.     Comments: Left foot neurovascularly intact  Psychiatric:        Mood and Affect: Mood normal.        Thought Content: Thought content normal.        Judgment: Judgment normal.      UC Treatments / Results  Labs (all labs ordered are listed, but only abnormal results are displayed) Labs Reviewed - No data to display  EKG   Radiology DG Foot Complete Left  Result Date: 03/10/2022 CLINICAL DATA:  Left fifth toe injury from kicking chair earlier today. EXAM: LEFT FOOT - COMPLETE 3+ VIEW COMPARISON:  None Available. FINDINGS: There is curvilinear lucency in a predominantly transverse orientation within the proximal shaft of the proximal phalanx of the fifth toe indicating an acute nondisplaced fracture. No intra-articular extension. Joint spaces are preserved. No dislocation. IMPRESSION: Acute nondisplaced fracture of the proximal shaft of the proximal phalanx of the fifth toe. Electronically Signed   By: Yvonne Kendall M.D.   On: 03/10/2022 14:05    Procedures Procedures (including critical care time)  Medications Ordered in UC Medications - No data to display  Initial Impression / Assessment and Plan / UC Course  I have reviewed the triage vital signs and the nursing notes.  Pertinent labs & imaging results that were available during my care of  the patient were reviewed by me and considered in my medical decision making (see chart for details).     X-ray showing a nondisplaced fracture of the left fifth toe.  Will place in a postop shoe, discussed respite call, over-the-counter pain relievers and podiatry follow-up if not resolving.  Final Clinical Impressions(s) / UC Diagnoses   Final diagnoses:  Closed nondisplaced fracture of phalanx of lesser toe of left foot, unspecified phalanx, initial encounter   Discharge Instructions   None    ED Prescriptions    None    PDMP not reviewed this encounter.   Volney American, [place] 03/10/22 1452

## 2022-03-10 NOTE — ED Triage Notes (Signed)
Pt presents with left 5th toe injury from kicking chair earlier today

## 2022-03-12 ENCOUNTER — Other Ambulatory Visit (HOSPITAL_COMMUNITY): Payer: Self-pay

## 2022-03-12 ENCOUNTER — Telehealth: Payer: Self-pay

## 2022-03-12 NOTE — Telephone Encounter (Signed)
Received fax from [employer] requesting information regarding pt's ongoing need for PAP. Completed form and faxed back. Will await f/u.  Fax# [phone] Phone# [phone]

## 2022-03-14 ENCOUNTER — Other Ambulatory Visit (HOSPITAL_COMMUNITY): Payer: Self-pay

## 2022-03-18 IMAGING — MR MR KNEE*L* W/O CM
7 series · 40 of 40 positions shown · non-contrast
Comparison: X-ray knee 07/15/2021.

CLINICAL DATA: Meniscal injury, knee. Left knee pain for 5 months.

EXAM:
MRI OF THE LEFT KNEE WITHOUT CONTRAST
TECHNIQUE: Multiplanar, multisequence MR imaging of the knee was performed. No
intravenous contrast was administered.

[Series 8: T2 fat-sat · axial · left · 4.0mm · 0.47mm/px · z∈[-100,+24]mm · 6 of 26 slices shown (1 of 3)]
[im 1/26]
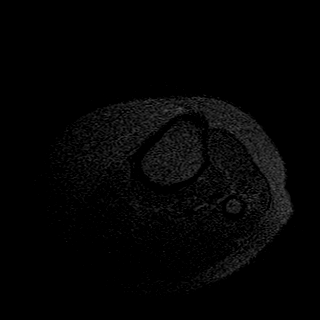
[im 6/26]
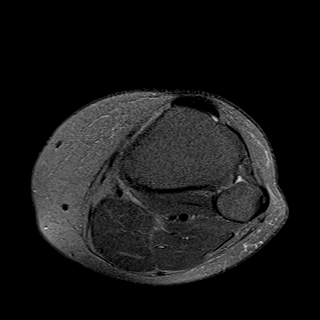
[im 11/26]
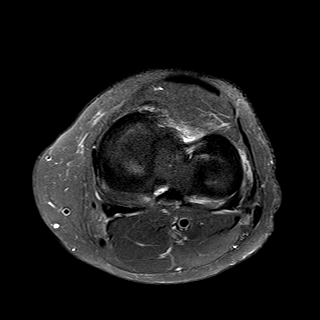
[im 16/26]
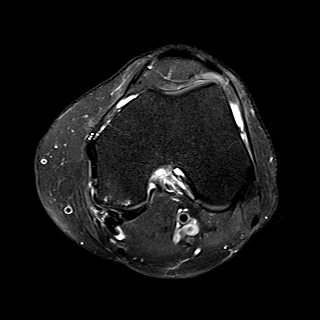
[im 21/26]
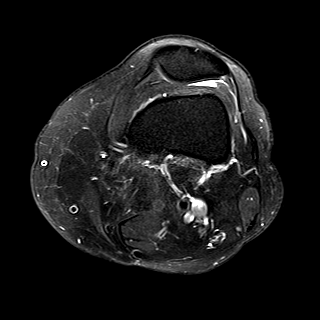
[im 26/26]
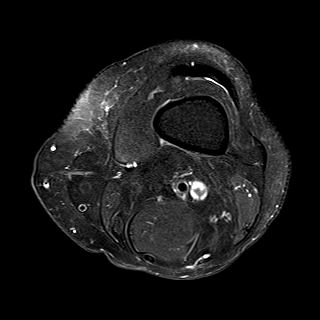

[Series 9: T1 · coronal · left · 4.0mm · 0.59mm/px · 5 of 22 slices shown]
[im 1/22]
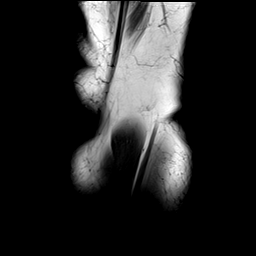
[im 6/22]
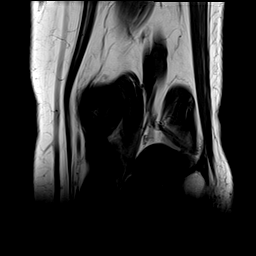
[im 11/22]
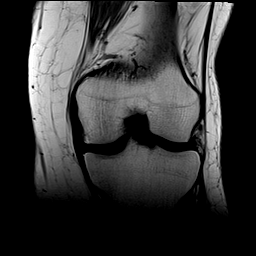
[im 16/22]
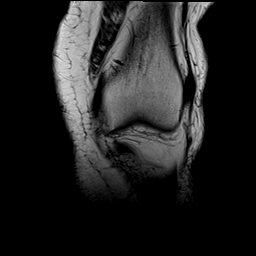
[im 22/22]
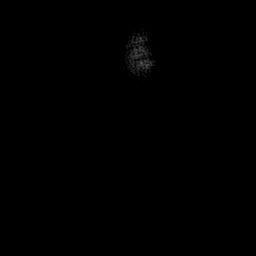

[Series 10: T2 fat-sat · coronal · left · 4.0mm · 0.59mm/px · 6 of 22 slices shown (2 of 3)]
[im 1/22]
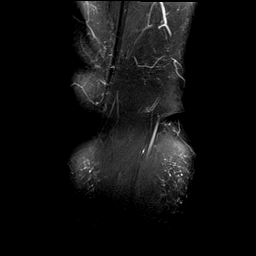
[im 5/22]
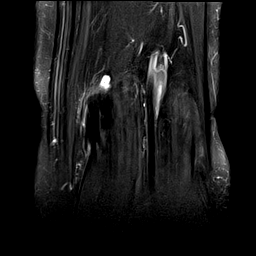
[im 9/22]
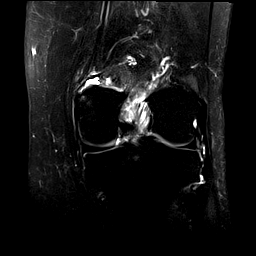
[im 13/22]
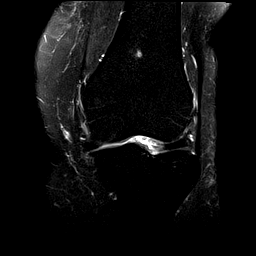
[im 17/22]
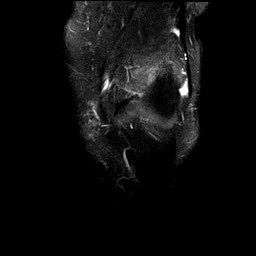
[im 22/22]
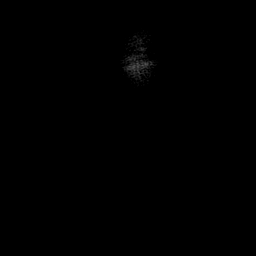

[Series 11: PD fat-sat · coronal · left · 4.0mm · 0.59mm/px · 6 of 22 slices shown (1 of 2)]
[im 1/22]
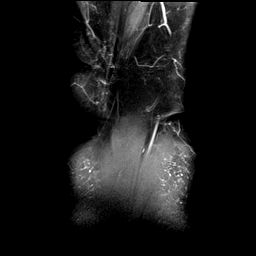
[im 5/22]
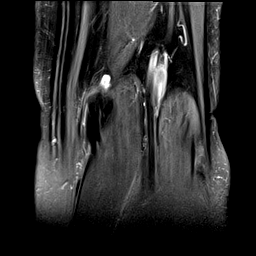
[im 9/22]
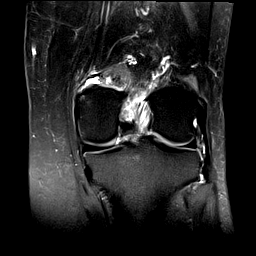
[im 13/22]
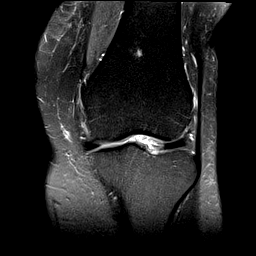
[im 17/22]
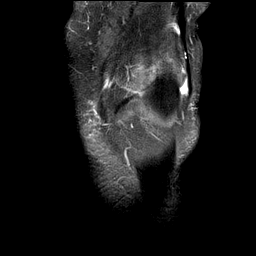
[im 22/22]
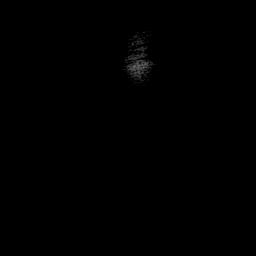

[Series 12: PD fat-sat · sagittal · left · 3.0mm · 0.52mm/px · 6 of 25 slices shown (2 of 2)]
[im 1/25]
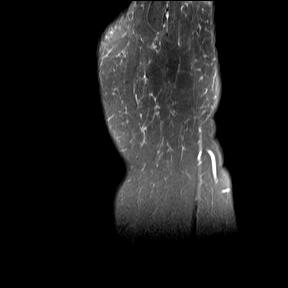
[im 5/25]
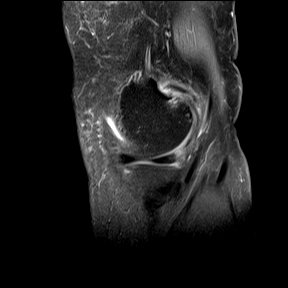
[im 10/25]
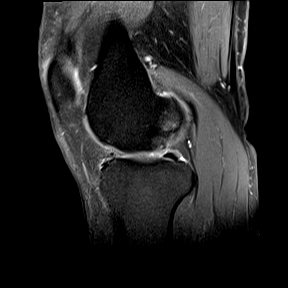
[im 15/25]
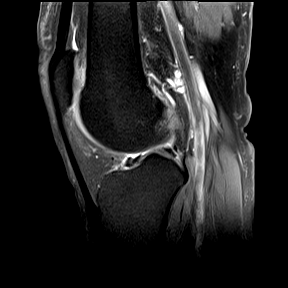
[im 20/25]
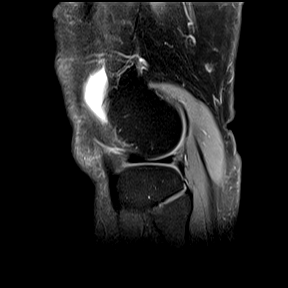
[im 25/25]
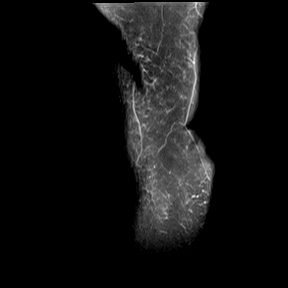

[Series 13: T2 fat-sat · sagittal · left · 3.0mm · 0.59mm/px · 7 of 26 slices shown (3 of 3)]
[im 1/26]
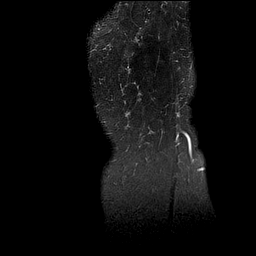
[im 5/26]
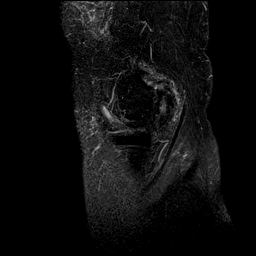
[im 9/26]
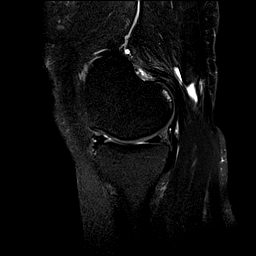
[im 13/26]
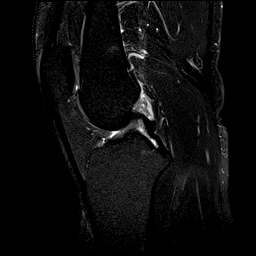
[im 17/26]
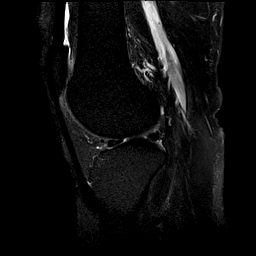
[im 21/26]
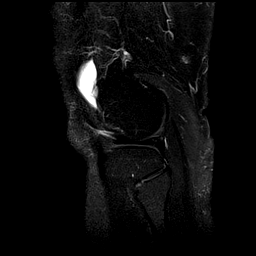
[im 26/26]
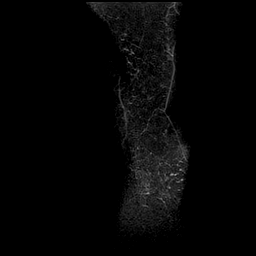

[Series 14: PD · coronal · left · 2.0mm · 0.47mm/px · 4 of 14 slices shown]
[im 1/14]
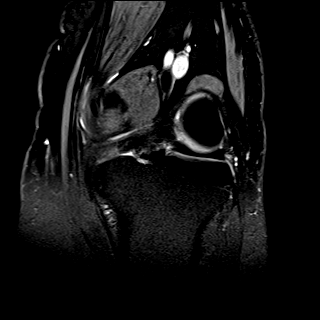
[im 5/14]
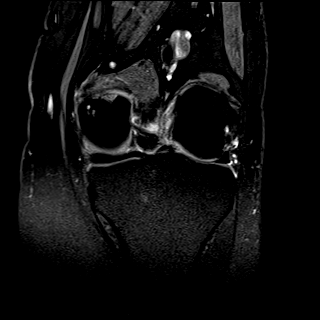
[im 9/14]
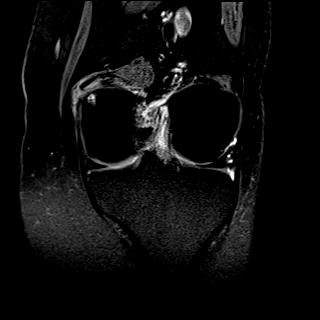
[im 14/14]
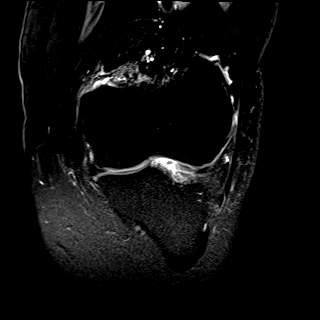

[40 of 40 positions shown; findings below may reference images not displayed]

FINDINGS: MENISCI

Medial: Oblique tear of the body of the medial meniscus extending to
the inferior articular surface.

Lateral: Intact.

LIGAMENTS

Cruciates: Intact ACL and PCL. ACL is expanded and increased in
signal as can be seen with mucinous degeneration.

Collaterals: Medial collateral ligament is intact. Lateral
collateral ligament complex is intact.

CARTILAGE

Patellofemoral:  No chondral defect.

Medial: High-grade partial-thickness cartilage loss of
full-thickness cartilage loss of the posterior nonweightbearing
surface of the medial femoral condyle with subchondral cystic
changes.

Lateral:  No chondral defect.

JOINT: Trace joint effusion. Normal [place]. No plical
thickening.

POPLITEAL FOSSA: Popliteus tendon is intact. Small Baker's cyst.

EXTENSOR MECHANISM: Intact quadriceps tendon. Intact patellar
tendon. Intact lateral patellar retinaculum. Intact medial patellar
retinaculum. Intact MPFL.

BONES: No aggressive osseous lesion. No fracture or dislocation.

Other: No fluid collection or hematoma. Muscles are normal.
IMPRESSION: 1. Oblique tear of the body of the medial meniscus extending to the
inferior articular surface.
2. High-grade partial-thickness cartilage loss of full-thickness
cartilage loss of the posterior nonweightbearing surface of the
medial femoral condyle with subchondral cystic changes.
3. Intact ACL with mucinous degeneration.

## 2022-03-21 NOTE — Telephone Encounter (Signed)
Richardson Landry from [employer] states last shipment was 03/04/2022. Richardson Landry phone number is [phone].

## 2022-03-27 DIAGNOSIS — M79675 Pain in left toe(s): Secondary | ICD-10-CM | POA: Diagnosis not present

## 2022-03-27 DIAGNOSIS — M79672 Pain in left foot: Secondary | ICD-10-CM | POA: Diagnosis not present

## 2022-03-27 NOTE — Telephone Encounter (Signed)
Called [employer] [facility] for update. Patient is actively enrolled until 06/05/22. She will need to re-enroll for 2024. Nothing further needed at this time  Phone: [phone], option 1, option 1  Knox Saliva, PharmD, MPH, BCPS, CPP Clinical Pharmacist (Rheumatology and Pulmonology)

## 2022-03-31 DIAGNOSIS — L718 Other rosacea: Secondary | ICD-10-CM | POA: Diagnosis not present

## 2022-03-31 DIAGNOSIS — L821 Other seborrheic keratosis: Secondary | ICD-10-CM | POA: Diagnosis not present

## 2022-04-03 ENCOUNTER — Other Ambulatory Visit: Payer: Self-pay | Admitting: Diagnostic Neuroimaging

## 2022-04-03 ENCOUNTER — Other Ambulatory Visit: Payer: Self-pay | Admitting: Nurse Practitioner

## 2022-04-03 DIAGNOSIS — J4551 Severe persistent asthma with (acute) exacerbation: Secondary | ICD-10-CM

## 2022-04-23 DIAGNOSIS — Z299 Encounter for prophylactic measures, unspecified: Secondary | ICD-10-CM | POA: Diagnosis not present

## 2022-04-23 DIAGNOSIS — J020 Streptococcal pharyngitis: Secondary | ICD-10-CM | POA: Diagnosis not present

## 2022-04-23 DIAGNOSIS — J029 Acute pharyngitis, unspecified: Secondary | ICD-10-CM | POA: Diagnosis not present

## 2022-04-30 DIAGNOSIS — Z299 Encounter for prophylactic measures, unspecified: Secondary | ICD-10-CM | POA: Diagnosis not present

## 2022-04-30 DIAGNOSIS — R5383 Other fatigue: Secondary | ICD-10-CM | POA: Diagnosis not present

## 2022-04-30 DIAGNOSIS — J029 Acute pharyngitis, unspecified: Secondary | ICD-10-CM | POA: Diagnosis not present

## 2022-05-14 ENCOUNTER — Telehealth: Payer: Self-pay

## 2022-05-14 NOTE — Telephone Encounter (Signed)
Contacted pt and discussed PAP re-enrollment for calendar year 2024. Pt requested renewal form be sent to them via DocuSign. Email address confirmed and envelope has been sent. Will await completion of application. Provider portion will be placed in Dr. Juanetta Gosling box for signature.

## 2022-05-14 NOTE — Telephone Encounter (Signed)
Pt's insurance plan has changed slightly since last year's submission.  Attempted to submit a Prior Authorization request to  [employer]  for NUCALA via CoverMyMeds, however request was cancelled due to authorization already being in place. The previously documented PA (under the old plan info) was only approved from 02/18/22 to 02/19/23, therefore the insurance must have automatically renewed the previous approval when the pt changed over to the new plan information.   Will archive this CMM key for future reference.  Key: [id number]

## 2022-05-19 NOTE — Telephone Encounter (Signed)
Pt completed her copy via DocuSign. Will continue to await return of provider portion.

## 2022-05-21 DIAGNOSIS — J45901 Unspecified asthma with (acute) exacerbation: Secondary | ICD-10-CM | POA: Diagnosis not present

## 2022-05-21 DIAGNOSIS — Z299 Encounter for prophylactic measures, unspecified: Secondary | ICD-10-CM | POA: Diagnosis not present

## 2022-05-21 DIAGNOSIS — R490 Dysphonia: Secondary | ICD-10-CM | POA: Diagnosis not present

## 2022-05-29 ENCOUNTER — Other Ambulatory Visit: Payer: Self-pay | Admitting: Diagnostic Neuroimaging

## 2022-06-03 ENCOUNTER — Telehealth: Payer: Self-pay

## 2022-06-03 DIAGNOSIS — R5383 Other fatigue: Secondary | ICD-10-CM | POA: Diagnosis not present

## 2022-06-03 DIAGNOSIS — Z299 Encounter for prophylactic measures, unspecified: Secondary | ICD-10-CM | POA: Diagnosis not present

## 2022-06-03 DIAGNOSIS — J45909 Unspecified asthma, uncomplicated: Secondary | ICD-10-CM | POA: Diagnosis not present

## 2022-06-03 NOTE — Telephone Encounter (Signed)
Pt needs an Follow-Up Appointment, please call pt.

## 2022-06-03 NOTE — Telephone Encounter (Signed)
Pt has been scheduled for 02-12 with a 10:00 check in for 10:30 appointment

## 2022-06-11 DIAGNOSIS — Z299 Encounter for prophylactic measures, unspecified: Secondary | ICD-10-CM | POA: Diagnosis not present

## 2022-06-11 DIAGNOSIS — E1165 Type 2 diabetes mellitus with hyperglycemia: Secondary | ICD-10-CM | POA: Diagnosis not present

## 2022-06-11 DIAGNOSIS — F319 Bipolar disorder, unspecified: Secondary | ICD-10-CM | POA: Diagnosis not present

## 2022-06-16 NOTE — Telephone Encounter (Signed)
Submitted Patient Assistance Application to [employer] for [place] along with provider portion, PA and income documents. Will update patient when we receive a response.  Fax# [phone] Phone# [phone]

## 2022-06-17 ENCOUNTER — Ambulatory Visit (HOSPITAL_COMMUNITY)
Admission: RE | Admit: 2022-06-17 | Discharge: 2022-06-17 | Disposition: A | Discharge status: Home / Self Care | Payer: PPO | Source: Ambulatory Visit | Attending: Pulmonary Disease | Admitting: Pulmonary Disease

## 2022-06-17 DIAGNOSIS — R911 Solitary pulmonary nodule: Secondary | ICD-10-CM

## 2022-06-17 DIAGNOSIS — R918 Other nonspecific abnormal finding of lung field: Secondary | ICD-10-CM | POA: Diagnosis not present

## 2022-06-22 ENCOUNTER — Ambulatory Visit
Admission: EM | Admit: 2022-06-22 | Discharge: 2022-06-22 | Disposition: A | Discharge status: Home / Self Care | Payer: PPO | Attending: Family Medicine | Admitting: Family Medicine

## 2022-06-22 DIAGNOSIS — R251 Tremor, unspecified: Secondary | ICD-10-CM

## 2022-06-22 DIAGNOSIS — T7840XA Allergy, unspecified, initial encounter: Secondary | ICD-10-CM

## 2022-06-22 DIAGNOSIS — F419 Anxiety disorder, unspecified: Secondary | ICD-10-CM

## 2022-06-22 DIAGNOSIS — T50905A Adverse effect of unspecified drugs, medicaments and biological substances, initial encounter: Secondary | ICD-10-CM

## 2022-06-22 MED ORDER — HYDROXYZINE HCL 25 MG PO TABS: 25.0000 mg | ORAL_TABLET | Freq: Three times a day (TID) | ORAL | 0 refills | Status: DC | PRN

## 2022-06-22 NOTE — Discharge Instructions (Signed)
Hold your antidepressants until you are able to follow-up with your prescribing provider first thing next week.  If your symptoms continue to persist or worsen at any time go to the emergency department for further monitoring.

## 2022-06-22 NOTE — ED Provider Notes (Addendum)
[facility]    CSN: [id number] Arrival date & time: 06/22/22  1359      History   Chief Complaint Chief Complaint  Patient presents with   Allergic Reaction   Shaking    HPI Nancy [name] is a 57 y.o. female.   Patient presenting today with 2-day history of restlessness, diffuse shakes/tremors since taking a Mucinex DM 2 days ago.  She denies any fever, vomiting, diarrhea, rashes, altered mental status, gait instability, severe headaches, dizziness.  She states she has had serotonin syndrome twice and red after taking the Mucinex that it can cause this in addition to her antidepressant medications so she is concerned about that.  She stopped her antidepressants yesterday and has not taken any today either.    Past Medical History:  Diagnosis Date   Allergic rhinitis    Asthma    Benign familial tremor    Bipolar 1 disorder (HCC)    Chronic neck and back pain    Degenerative disk disease    DJD (degenerative joint disease), lumbar    Dyslipidemia    Fibromyalgia    GERD (gastroesophageal reflux disease)    Hiatal hernia    History of kidney stones    Irritable bowel syndrome (IBS)    Memory disorder 07/18/2014   Memory loss    Migraine headache    Nephrolithiasis    Pneumonia 1986   Serotonin syndrome    Tremor    hands,arms    Patient Active Problem List   Diagnosis Date Noted   Acute medial meniscus tear of left knee    GERD (gastroesophageal reflux disease) 06/13/2021   Allergic rhinitis 12/30/2019   Preoperative clearance 11/09/2018   Memory disorder 07/18/2014   Chest pain of uncertain etiology 93/79/0240   Severe persistent asthma with acute bronchitis and acute exacerbation 03/04/2013   Exercise-induced asthma 07/30/2012   Severe persistent asthma in adult without complication 97/35/3299    Past Surgical History:  Procedure Laterality Date   ANKLE FRACTURE SURGERY  2006   Right   APPENDECTOMY     back fusion     BACK SURGERY      BRONCHOSCOPY     S. aureus from BAL   CARDIAC CATHETERIZATION  11/27/2006   minimal CAD   CHOLECYSTECTOMY     COLONOSCOPY WITH PROPOFOL N/A 12/12/2020   Procedure: COLONOSCOPY WITH PROPOFOL;  Surgeon: Rogene Houston, MD;  Location: [facility];  Service: Endoscopy;  Laterality: N/A;  820   ESOPHAGEAL DILATION  08/06/2004   KNEE ARTHROSCOPY WITH MEDIAL MENISECTOMY Left 08/09/2021   Procedure: KNEE ARTHROSCOPY WITH PARTIAL MEDIAL MENISCECTOMY;  Surgeon: Carole Civil, MD;  Location: [facility];  Service: Orthopedics;  Laterality: Left;   LAPAROSCOPIC CHOLECYSTECTOMY     LASER ABLATION OF THE CERVIX     MICRODISSECTION L5-S1  08/13/2000   neck fusion     TONSILLECTOMY      OB History   No obstetric history on file.      Home Medications    Prior to Admission medications   Medication Sig Start Date End Date Taking? Authorizing Provider  hydrOXYzine (ATARAX) 25 MG tablet Take 1 tablet (25 mg total) by mouth every 8 (eight) hours as needed. 06/22/22  Yes Volney American, PA-C  albuterol (PROVENTIL) (2.5 MG/3ML) 0.083% nebulizer solution Take 2.5 mg by nebulization every 6 (six) hours as needed for shortness of breath or wheezing.    [provider]  Ascorbic Acid (VITAMIN C)  1000 MG tablet Take 1,000 mg by mouth daily.    [provider]  atorvastatin (LIPITOR) 10 MG tablet Take 10 mg by mouth daily.    [provider]  BLACK ELDERBERRY PO Take 1 each by mouth daily. Elderberry gummy    [provider]  BREZTRI AEROSPHERE 160-9-4.8 MCG/ACT AERO INHALE 2 PUFFS INTO THE LUNGS IN THE MORNING AND AT BEDTIME. 04/04/22   Cobb, Karie Schwalbe, NP  buPROPion (WELLBUTRIN XL) 300 MG 24 hr tablet Take 300 mg by mouth every morning. 11/20/20   [provider]  carbamazepine (TEGRETOL) 100 MG chewable tablet Chew 400 mg by mouth at bedtime. 11/23/20   [provider]  cetirizine (ZYRTEC) 10 MG tablet Take 10 mg by mouth daily.    [provider]  Cholecalciferol (VITAMIN D3) 50 MCG (2000 UT) capsule Take 2,000 Units by mouth daily.    [provider]  Cyanocobalamin (VITAMIN B-12) 2500 MCG SUBL Place 2,500 mcg under the tongue daily.    [provider]  docusate sodium (COLACE) 50 MG capsule Take 150 mg by mouth 2 (two) times daily.    [provider]  EPINEPHrine 0.3 mg/0.3 mL IJ SOAJ injection Inject 0.3 mg into the muscle as needed for anaphylaxis.    [provider]  lamoTRIgine (LAMICTAL) 150 MG tablet Take 300 mg by mouth at bedtime.    [provider]  meloxicam (MOBIC) 15 MG tablet Take 15 mg by mouth daily. 05/19/20   [provider]  Mepolizumab (NUCALA) 100 MG/ML SOAJ Inject 1 mL (100 mg total) into the skin every 28 (twenty-eight) days. 02/13/22   Chesley Mires, MD  montelukast (SINGULAIR) 10 MG tablet Take 10 mg by mouth at bedtime.    [provider]  pantoprazole (PROTONIX) 40 MG tablet Take 40 mg by mouth daily.    [provider]  primidone (MYSOLINE) 50 MG tablet TAKE 1 TABLET (50 MG) BY MOUTH IN THE MORNING AND AT BEDTIME 06/03/22   Penumalli, Earlean Polka, MD  promethazine (PHENERGAN) 12.5 MG tablet Take 1 tablet (12.5 mg total) by mouth every 6 (six) hours as needed for nausea or vomiting. 08/09/21   Carole Civil, MD  rizatriptan (MAXALT-MLT) 10 MG disintegrating tablet Take 1 tablet (10 mg total) by mouth as needed for migraine. May repeat in 2 hours if needed 01/14/22   Penumalli, Earlean Polka, MD  topiramate (TOPAMAX) 50 MG tablet TAKE 1 TABLET BY MOUTH TWICE A DAY 06/03/22   Penumalli, Earlean Polka, MD  zinc gluconate 50 MG tablet Take 50 mg by mouth daily.    [provider]    Family History Family History  Problem Relation Age of Onset   Allergies Mother    Clotting disorder Mother    Diabetes Mother    Allergies Father    Heart disease Father    Heart attack Father    Diabetes Father    Allergies Brother    Chorea  Maternal Grandmother     Social History Social History   Tobacco Use   Smoking status: Former    Packs/day: 0.50    Years: 8.00    Total pack years: 4.00    Types: Cigarettes    Quit date: 06/09/1978    Years since quitting: 44.0   Smokeless tobacco: Never  Vaping Use   Vaping Use: Never used  Substance Use Topics   Alcohol use: No   Drug use: No     Allergies  Cymbalta [duloxetine hcl], Divalproex sodium, Effexor [venlafaxine hydrochloride], Geodon [ziprasidone hydrochloride], Prozac [fluoxetine hcl], Thorazine [chlorpromazine hcl], Cephalexin, Cephalosporins, Codeine, Morphine, Theo-dur [theophylline], and Ziprasidone   Review of Systems Review of Systems Per HPI  Physical Exam Triage Vital Signs ED Triage Vitals  Enc Vitals Group     BP --      Pulse Rate 06/22/22 1410 (!) 114     Resp 06/22/22 1410 20     Temp 06/22/22 1410 98.6 F (37 C)     Temp Source 06/22/22 1410 Oral     SpO2 06/22/22 1410 95 %     Weight --      Height --      Head Circumference --      Peak Flow --      Pain Score 06/22/22 1412 0     Pain Loc --      Pain Edu? --      Excl. in [place]? --    No data found.  Updated Vital Signs Pulse (!) 114   Temp 98.6 F (37 C) (Oral)   Resp 20   SpO2 95%   Visual Acuity Right Eye Distance:   Left Eye Distance:   Bilateral Distance:    Right Eye Near:   Left Eye Near:    Bilateral Near:     Physical Exam Vitals and nursing note reviewed.  Constitutional:      Appearance: She is not ill-appearing or diaphoretic.  HENT:     Head: Atraumatic.     Mouth/Throat:     Mouth: Mucous membranes are moist.     Pharynx: Oropharynx is clear.  Eyes:     Extraocular Movements: Extraocular movements intact.     Conjunctiva/sclera: Conjunctivae normal.     Pupils: Pupils are equal, round, and reactive to light.     Comments: No nystagmus  Cardiovascular:     Rate and Rhythm: Normal rate and regular rhythm.     Heart sounds: Normal heart  sounds.  Pulmonary:     Effort: Pulmonary effort is normal.     Breath sounds: Normal breath sounds. No wheezing or rales.  Musculoskeletal:        General: Normal range of motion.     Cervical back: Normal range of motion and neck supple.  Skin:    General: Skin is warm and dry.     Findings: No rash.  Neurological:     Mental Status: She is alert and oriented to person, place, and time.     Comments: Fine tremor diffusely, persistent  Psychiatric:        Mood and Affect: Mood normal.        Thought Content: Thought content normal.        Judgment: Judgment normal.      UC Treatments / Results  Labs (all labs ordered are listed, but only abnormal results are displayed) Labs Reviewed - No data to display  EKG   Radiology No results found.  Procedures Procedures (including critical care time)  Medications Ordered in UC Medications - No data to display  Initial Impression / Assessment and Plan / UC Course  I have reviewed the triage vital signs and the nursing notes.  Pertinent labs & imaging results that were available during my care of the patient were reviewed by me and considered in my medical decision making (see chart for details).     Tachycardic in triage, otherwise vital signs very reassuring.  Unable to obtain a good  blood pressure reading today given the tremors.  Unclear at this time if this is mild serotonin syndrome versus a persisting allergic reaction to the Mucinex.  She appears stable and symptoms have been ongoing for 2 days so we will forego sending to the emergency department at this time, however patient aware to go if her symptoms worsen at any time.  Discussed to hydrate is much as possible, hydroxyzine given to see if this helps with the anxiousness and hold antidepressant medications until able to talk to prescribing provider for further advice.  Final Clinical Impressions(s) / UC Diagnoses   Final diagnoses:  Shakiness  Anxiousness  Adverse  effect of drug, initial encounter     Discharge Instructions      Hold your antidepressants until you are able to follow-up with your prescribing provider first thing next week.  If your symptoms continue to persist or worsen at any time go to the emergency department for further monitoring.    ED Prescriptions     Medication Sig Dispense Auth. Provider   hydrOXYzine (ATARAX) 25 MG tablet Take 1 tablet (25 mg total) by mouth every 8 (eight) hours as needed. 21 tablet Volney American, [place]      PDMP not reviewed this encounter.   Volney American, [place] 06/22/22 [place], [place], [place] 06/25/22 1250

## 2022-06-22 NOTE — ED Triage Notes (Signed)
Pt reports she took mucinex and now is shaking uncontrollably x 2 days.    Pt just came off of her antibotics but the cough still hasn't gone away.  Pt is really restless  Mucinex will be added to allergies

## 2022-07-03 NOTE — Telephone Encounter (Signed)
Spoke with our [facility] FRM and requested update regarding pt's renewal status. Will await f/u.

## 2022-07-04 DIAGNOSIS — M25562 Pain in left knee: Secondary | ICD-10-CM | POA: Diagnosis not present

## 2022-07-07 ENCOUNTER — Ambulatory Visit: Payer: PPO | Admitting: Orthopedic Surgery

## 2022-07-07 DIAGNOSIS — M25562 Pain in left knee: Secondary | ICD-10-CM | POA: Diagnosis not present

## 2022-07-07 DIAGNOSIS — G8929 Other chronic pain: Secondary | ICD-10-CM

## 2022-07-07 NOTE — Progress Notes (Unsigned)
Chief Complaint  Patient presents with   Knee Pain    LT knee pain Painful increased/became constant 07/03/22 Thursday   This is a 57 year old female status post lower spinal fusion November 23, 2018 status post arthroscopy left knee partial medial meniscectomy August 09, 2021 [facility] complaining of left leg pain centered around her left knee  X-rays were obtained x-rays were normal.  It was suggested that the patient take some meloxicam or prednisone but for various reasons this was not taken out  She comes in saying that her left leg is hurting on the lateral side near the fibula and there is pressure around the knee  I reviewed her meniscal surgery pictures and her entire knee is normal except for the medial meniscus tear which had a good resection and no residual meniscal fragments noted.  She notes after sitting for long periods of time she will have increased pain in the left knee as well  I reviewed the x-rays that she brought in on a disc she has a normal alignment in the knee no joint space narrowing  I think the patient is having referred pain to the knee the most likely source is lumbar related as she had a L3-4 laminectomy transforaminal lumbar interbody fusion with revision of the L4 S1 hardware with graft  It could be some neurologic issue unrelated to the lumbar spine as well.  But I am very confident after talking to her at length that she does not have real knee pathology  Her examination revealed that   There was no effusion in the knee she had full range of motion the tenderness was actually in the fibula area around the neck and down the side of the leg  The knee was  There was no erythema  Encounter Diagnosis  Name Primary?   Chronic pain of left knee Yes    Referred pain from another source recommend follow-up with neurosurgery to check for any residual nerve issue

## 2022-07-17 NOTE — Patient Instructions (Signed)
Below is our plan:  We will continue primidone 57m TWICE but will add third dose of 517mas needed. Do not exceed 15075maily. Continue topiramate 17m56mice daily and rizatriptan as needed. Please take 1 tablet at onset of headache. May take 1 additional tablet in 2 hours if needed. Do not take more than 2 tablets in 24 hours or more than 10 in a month.   Please make sure you are staying well hydrated. I recommend 50-60 ounces daily. Well balanced diet and regular exercise encouraged. Consistent sleep schedule with 6-8 hours recommended.   Please continue follow up with care team as directed.   Follow up with me in 1 year   You may receive a survey regarding today's visit. I encourage you to leave honest feed back as I do use this information to improve patient care. Thank you for seeing me today!

## 2022-07-17 NOTE — Progress Notes (Signed)
Chief Complaint  Patient presents with   Follow-up    Pt in room 2, here for tremor follow up. Patient still having tremors, reports they are not worse.    HISTORY OF PRESENT ILLNESS:  07/21/22 ALL:  RIELLE FLORY is a 57 y.o. female here today for follow up for tremor and migraines. She was last seen by video with Dr Leta Baptist who started topiramate 9m BID and rizatriptan as needed. She reports headache frequency and severity has decreased. She still has regular headaches about 12 headache a month. She has not had any severe headaches, recently. She does not remember the last migraine. She has used rizatriptan a couple of times and feels it helped.   Tremor is stable. No significant change. She continues to notice most in social situations but can have tremor even in times when not anxious or active. She reports sometimes her whole body shakes. She is adjusting her Wellbutrin dose to see if this helps. She is followed closely by PCP.    HISTORY (copied from Dr PGladstone Lighterprevious note)  UPDATE (01/14/22, VRP): Since last visit, more tremors. More migraines (left sided, sens to light, nausea, cant sleep). Similar to prior migraine (since teenage years). Now worse in last 3 months.    PRIOR HPI: 57year old female here for evaluation of tremor.  Has had postural tremor since at least 2015, previously on primidone.  She does not remember taking this medicine in the past but I can see it on her prior neurology evaluation notes from Dr. JTomi Likensand Dr. WJannifer Franklin    Symptoms worsening in the last few months.  Symptoms affecting her day-to-day activities and quality of life.   No significant triggering or aggravating factors.  No alleviating factors.   REVIEW OF SYSTEMS: Out of a complete 14 system review of symptoms, the patient complains only of the following symptoms, tremor, headaches, anxiety and all other reviewed systems are negative.   ALLERGIES: Allergies  Allergen Reactions    Cymbalta [Duloxetine Hcl] Other (See Comments)    serotonin syndrome   Divalproex Sodium Other (See Comments)    unknown   Effexor [Venlafaxine Hydrochloride] Other (See Comments)    serotonin syndrome   Geodon [Ziprasidone Hydrochloride] Other (See Comments)    Hands and feet tingling.   Prozac [Fluoxetine Hcl] Other (See Comments)    suicidal   Thorazine [Chlorpromazine Hcl] Other (See Comments)    unknown   Cephalexin Rash    Joint swelling   Cephalosporins Rash    Joint swelling   Keflex   Codeine Nausea Only    Pt states must take phenergan with codeine.       Morphine Nausea Only        Theo-Dur [Theophylline] Palpitations   Ziprasidone Nausea Only and Other (See Comments)    Hands and feet tingling.     HOME MEDICATIONS: Outpatient Medications Prior to Visit  Medication Sig Dispense Refill   albuterol (PROVENTIL) (2.5 MG/3ML) 0.083% nebulizer solution Take 2.5 mg by nebulization every 6 (six) hours as needed for shortness of breath or wheezing.     Ascorbic Acid (VITAMIN C) 1000 MG tablet Take 1,000 mg by mouth daily.     atorvastatin (LIPITOR) 10 MG tablet Take 10 mg by mouth daily.     BLACK ELDERBERRY PO Take 1 each by mouth daily. Elderberry gummy     BREZTRI AEROSPHERE 160-9-4.8 MCG/ACT AERO INHALE 2 PUFFS INTO THE LUNGS IN THE MORNING AND AT BEDTIME. 10.7 each  3   buPROPion (WELLBUTRIN XL) 150 MG 24 hr tablet Take 150 mg by mouth daily.     carbamazepine (TEGRETOL) 100 MG chewable tablet Chew 400 mg by mouth at bedtime.     cetirizine (ZYRTEC) 10 MG tablet Take 10 mg by mouth daily.     Cholecalciferol (VITAMIN D3) 50 MCG (2000 UT) capsule Take 2,000 Units by mouth daily.     Cyanocobalamin (VITAMIN B-12) 2500 MCG SUBL Place 2,500 mcg under the tongue daily.     docusate sodium (COLACE) 50 MG capsule Take 150 mg by mouth 2 (two) times daily.     EPINEPHrine 0.3 mg/0.3 mL IJ SOAJ injection Inject 0.3 mg into the muscle as needed for anaphylaxis.      lamoTRIgine (LAMICTAL) 150 MG tablet Take 300 mg by mouth at bedtime.     meloxicam (MOBIC) 15 MG tablet Take 15 mg by mouth daily.     Mepolizumab (NUCALA) 100 MG/ML SOAJ Inject 1 mL (100 mg total) into the skin every 28 (twenty-eight) days. 1 mL 5   montelukast (SINGULAIR) 10 MG tablet Take 10 mg by mouth at bedtime.     pantoprazole (PROTONIX) 40 MG tablet Take 40 mg by mouth daily.     promethazine (PHENERGAN) 12.5 MG tablet Take 1 tablet (12.5 mg total) by mouth every 6 (six) hours as needed for nausea or vomiting. 30 tablet 0   rizatriptan (MAXALT-MLT) 10 MG disintegrating tablet Take 1 tablet (10 mg total) by mouth as needed for migraine. May repeat in 2 hours if needed 9 tablet 11   topiramate (TOPAMAX) 50 MG tablet TAKE 1 TABLET BY MOUTH TWICE A DAY 180 tablet 0   buPROPion (WELLBUTRIN XL) 300 MG 24 hr tablet Take 300 mg by mouth every morning. (Patient not taking: Reported on 07/21/2022)     No facility-administered medications prior to visit.     PAST MEDICAL HISTORY: Past Medical History:  Diagnosis Date   Allergic rhinitis    Asthma    Benign familial tremor    Bipolar 1 disorder (HCC)    Chronic neck and back pain    Degenerative disk disease    DJD (degenerative joint disease), lumbar    Dyslipidemia    Fibromyalgia    GERD (gastroesophageal reflux disease)    Hiatal hernia    History of kidney stones    Irritable bowel syndrome (IBS)    Memory disorder 07/18/2014   Memory loss    Migraine headache    Nephrolithiasis    Pneumonia 1986   Serotonin syndrome    Tremor    hands,arms     PAST SURGICAL HISTORY: Past Surgical History:  Procedure Laterality Date   ANKLE FRACTURE SURGERY  2006   Right   APPENDECTOMY     back fusion     BACK SURGERY     BRONCHOSCOPY     S. aureus from BAL   CARDIAC CATHETERIZATION  11/27/2006   minimal CAD   CHOLECYSTECTOMY     COLONOSCOPY WITH PROPOFOL N/A 12/12/2020   Procedure: COLONOSCOPY WITH PROPOFOL;  Surgeon: Rogene Houston, MD;  Location: [facility];  Service: Endoscopy;  Laterality: N/A;  820   ESOPHAGEAL DILATION  08/06/2004   KNEE ARTHROSCOPY WITH MEDIAL MENISECTOMY Left 08/09/2021   Procedure: KNEE ARTHROSCOPY WITH PARTIAL MEDIAL MENISCECTOMY;  Surgeon: Carole Civil, MD;  Location: [facility];  Service: Orthopedics;  Laterality: Left;   LAPAROSCOPIC CHOLECYSTECTOMY     LASER ABLATION OF THE CERVIX  MICRODISSECTION L5-S1  08/13/2000   neck fusion     TONSILLECTOMY       FAMILY HISTORY: Family History  Problem Relation Age of Onset   Allergies Mother    Clotting disorder Mother    Diabetes Mother    Allergies Father    Heart disease Father    Heart attack Father    Diabetes Father    Allergies Brother    Chorea Maternal Grandmother      SOCIAL HISTORY: Social History   Socioeconomic History   Marital status: Married    Spouse name: Not on file   Number of children: 1   Years of education: HS   Highest education level: Not on file  Occupational History   Occupation: disabled    Employer: UNEMPLOYED  Tobacco Use   Smoking status: Former    Packs/day: 0.50    Years: 8.00    Total pack years: 4.00    Types: Cigarettes    Quit date: 06/09/1978    Years since quitting: 44.1   Smokeless tobacco: Never  Vaping Use   Vaping Use: Never used  Substance and Sexual Activity   Alcohol use: No   Drug use: No   Sexual activity: Not on file  Other Topics Concern   Not on file  Social History Narrative   Patient is right handed.   Patient does not drink caffeine.   Social Determinants of Health   Financial Resource Strain: Not on file  Food Insecurity: Not on file  Transportation Needs: Not on file  Physical Activity: Not on file  Stress: Not on file  Social Connections: Not on file  Intimate Partner Violence: Not on file     PHYSICAL EXAM  Vitals:   07/21/22 1014  BP: 124/77  Pulse: 94  Weight: 178 lb (80.7 kg)  Height: 5' 4.5" (1.638 m)   Body mass index  is 30.08 kg/m.  Generalized: Well developed, in no acute distress  Cardiology: normal rate and rhythm, no murmur auscultated  Respiratory: clear to auscultation bilaterally    Neurological examination  Mentation: Alert oriented to time, place, history taking. Follows all commands speech and language fluent Cranial nerve II-XII: Pupils were equal round reactive to light. Extraocular movements were full, visual field were full on confrontational test. Facial sensation and strength were normal. Head turning and shoulder shrug  were normal and symmetric. Motor: The motor testing reveals 5 over 5 strength of all 4 extremities. Good symmetric motor tone is noted throughout. Action tremor noted bilaterally  Sensory: Sensory testing is intact to soft touch on all 4 extremities. No evidence of extinction is noted.  Coordination: Cerebellar testing reveals good finger-nose-finger and heel-to-shin bilaterally.  Gait and station: Gait is normal.   DIAGNOSTIC DATA (LABS, IMAGING, TESTING) - I reviewed patient records, labs, notes, testing and imaging myself where available.  Lab Results  Component Value Date   WBC 5.7 08/06/2021   HGB 14.2 08/06/2021   HCT 42.5 08/06/2021   MCV 93.8 08/06/2021   PLT 285 08/06/2021      Component Value Date/Time   NA 137 08/06/2021 1457   K 3.8 08/06/2021 1457   CL 105 08/06/2021 1457   CO2 23 08/06/2021 1457   GLUCOSE 124 (H) 08/06/2021 1457   BUN 25 (H) 08/06/2021 1457   CREATININE 0.74 08/06/2021 1457   CALCIUM 9.2 08/06/2021 1457   PROT 7.0 06/29/2010 1353   ALBUMIN 4.1 06/29/2010 1353   AST 27 06/29/2010 1353  ALT 24 06/29/2010 1353   ALKPHOS 68 06/29/2010 1353   BILITOT 0.4 06/29/2010 1353   GFRNONAA >60 08/06/2021 1457   GFRAA  06/29/2010 1353    >60        The eGFR has been calculated using the MDRD equation. This calculation has not been validated in all clinical situations. eGFR's persistently <60 mL/min signify possible Chronic  Kidney Disease.   No results found for: "CHOL", "HDL", "LDLCALC", "LDLDIRECT", "TRIG", "CHOLHDL" Lab Results  Component Value Date   HGBA1C (H) 11/28/2006    6.7 (NOTE)   The ADA recommends the following therapeutic goals for glycemic   control related to Hgb A1C measurement:   Goal of Therapy:   < 7.0% Hgb A1C   Action Suggested:  > 8.0% Hgb A1C   Ref:  Diabetes Care, 22, Suppl. 1, 1999   Lab Results  Component Value Date   VITAMINB12 432 06/14/2013   No results found for: "TSH"     03/21/2015   11:32 AM 07/18/2014    3:21 PM  MMSE - Mini Mental State Exam  Orientation to time 5 5  Orientation to Place 4 4  Registration 3 3  Attention/ Calculation 5 5  Recall 2 2  Language- name 2 objects 2 2  Language- repeat 1 1  Language- follow 3 step command 3 3  Language- read & follow direction 1 1  Write a sentence 1 1  Copy design 1 1  Total score 28 28         No data to display           ASSESSMENT AND PLAN  57 y.o. year old female  has a past medical history of Allergic rhinitis, Asthma, Benign familial tremor, Bipolar 1 disorder ([place]), Chronic neck and back pain, Degenerative disk disease, DJD (degenerative joint disease), lumbar, Dyslipidemia, Fibromyalgia, GERD (gastroesophageal reflux disease), Hiatal hernia, History of kidney stones, Irritable bowel syndrome (IBS), Memory disorder (07/18/2014), Memory loss, Migraine headache, Nephrolithiasis, Pneumonia (1986), Serotonin syndrome, and Tremor. here with    Essential tremor - Plan: Primidone level  Migraine without aura and without status migrainosus, not intractable  [place] A Laws is doing well. She reports headaches are less frequent and less severe. We will continue topiramate 98m BID and rizatriptan as needed. We will continue primidone but increase dose to 539mBID with extra 5040mose as needed. Not to exceed 150m16mily. [facility] if she wishes. Healthy  lifestyle habits encouraged. She will follow up with PCP as directed. She will return to see me in 1 year, sooner if needed. She verbalizes understanding and agreement with this plan.    Orders Placed This Encounter  Procedures   Primidone level     Meds ordered this encounter  Medications   topiramate (TOPAMAX) 50 MG tablet    Sig: Take 1 tablet (50 mg total) by mouth 2 (two) times daily.    Dispense:  180 tablet    Refill:  3    Order Specific Question:   Supervising Provider    Answer:   AHERMelvenia [id number]: primidone (MYSOLINE) 50 MG tablet    Sig: Take 1 tablet (50 mg total) by mouth 3 (three) times daily.    Dispense:  270 tablet    Refill:  3    Order Specific Question:   Supervising Provider    Answer:   AHERMelvenia [id number]  Debbora Presto, MSN, FNP-C 07/21/2022, 10:58 AM  [facility] [address] [place], [place] [address] [phone]

## 2022-07-18 NOTE — Telephone Encounter (Signed)
Received confirmation that pt currently renewed until 06/09/2023. Nothing further needed at this time.

## 2022-07-21 ENCOUNTER — Ambulatory Visit: Payer: PPO | Admitting: Family Medicine

## 2022-07-21 ENCOUNTER — Encounter: Payer: Self-pay | Admitting: Family Medicine

## 2022-07-21 VITALS — BP 124/77 | HR 94 | Ht 64.5 in | Wt 178.0 lb

## 2022-07-21 DIAGNOSIS — G43009 Migraine without aura, not intractable, without status migrainosus: Secondary | ICD-10-CM | POA: Diagnosis not present

## 2022-07-21 DIAGNOSIS — G250 Essential tremor: Secondary | ICD-10-CM | POA: Diagnosis not present

## 2022-07-21 MED ORDER — PRIMIDONE 50 MG PO TABS: 50.0000 mg | ORAL_TABLET | Freq: Three times a day (TID) | ORAL | 3 refills | Status: DC

## 2022-07-21 MED ORDER — TOPIRAMATE 50 MG PO TABS: 50.0000 mg | ORAL_TABLET | Freq: Two times a day (BID) | ORAL | 3 refills | Status: DC

## 2022-07-30 ENCOUNTER — Encounter: Payer: Self-pay | Admitting: Pulmonary Disease

## 2022-07-30 ENCOUNTER — Ambulatory Visit (INDEPENDENT_AMBULATORY_CARE_PROVIDER_SITE_OTHER): Payer: PPO | Admitting: Pulmonary Disease

## 2022-07-30 VITALS — BP 122/72 | HR 84 | Ht 64.5 in | Wt 177.8 lb

## 2022-07-30 DIAGNOSIS — I2510 Atherosclerotic heart disease of native coronary artery without angina pectoris: Secondary | ICD-10-CM | POA: Diagnosis not present

## 2022-07-30 DIAGNOSIS — J4550 Severe persistent asthma, uncomplicated: Secondary | ICD-10-CM

## 2022-07-30 DIAGNOSIS — I2584 Coronary atherosclerosis due to calcified coronary lesion: Secondary | ICD-10-CM

## 2022-07-30 DIAGNOSIS — R911 Solitary pulmonary nodule: Secondary | ICD-10-CM

## 2022-07-30 DIAGNOSIS — J301 Allergic rhinitis due to pollen: Secondary | ICD-10-CM | POA: Diagnosis not present

## 2022-07-30 NOTE — Progress Notes (Signed)
[facility]  Chief Complaint  Patient presents with   Follow-up    Breathing doing well.  Had bronchitis a few months ago and finally feels better     Past Surgical History:  She  has a past surgical history that includes Laparoscopic cholecystectomy; MICRODISSECTION L5-S1 (08/13/2000); Appendectomy; Tonsillectomy; back fusion; Ankle fracture surgery (2006); Esophageal dilation (08/06/2004); Cardiac catheterization (11/27/2006); Bronchoscopy; Laser ablation of the cervix; Back surgery; neck fusion; Colonoscopy with propofol (N/A, 12/12/2020); Cholecystectomy; and Knee arthroscopy with medial menisectomy (Left, 08/09/2021).  Past Medical History:  Serotonin Syndrome, PNA 1986, Nephrolithiasis, Migraine HA, Memory loss, GERD with HH, Fibromyalgia, HLD, DJD, Bipolar, Tremor  Constitutional:  BP 122/72   Pulse 84   Ht 5' 4.5" (1.638 m)   Wt 177 lb 12.8 oz (80.6 kg)   SpO2 97% Comment: ra  BMI 30.05 kg/m   Brief Summary:  Nancy [name] is a 57 y.o. female former smoker with eosinophilic asthma and lung nodule.       Subjective:   She had several respiratory infections in December and January.  Was on 3 different rounds of antibiotics and prednisone from her PCP.  Last was early in January.  Has been doing well since then.  Hasn't needed albuterol recently.  Not having cough, wheeze, sputum, or sinus congestion.  Sleeping okay.  Doesn't feel like her breathing limits her activity level.    CT chest showed mild coronary calcification, mild scarring and stable LLL nodule.  Physical Exam:   Appearance - well kempt   ENMT - no sinus tenderness, no oral exudate, no LAN, Mallampati 3 airway, no stridor  Respiratory - equal breath sounds bilaterally, no wheezing or rales  CV - s1s2 regular rate and rhythm, no murmurs  Ext - no clubbing, no edema  Skin - no rashes  Psych - normal mood and affect    Pulmonary testing:  Spirometry  03/05/10>>FEV1 2.43(87%), FEV1% 93 IgE 2008>>412 IgE 04/01/11>>162.1 Xolair from 11/23/07 to 03/05/10>>stopped due to insurance issues RAST 04/01/11>>Cat dander, dog dander, dust mites PFT 05/28/11>>FEV1 3.01(112%), FEV1% 78, TLC 5.64(112%), RV 2.28(135%), DLCO 99%, positive bronchodilator response. December 2012 >> resumed xolair >> stopped Summer 2014 due to finances >> resumed September 2014 January 2016 >> xolair d/c'ed due to finances FeNO 08/19/16 >> 10 Spirometry 08/19/16 >> FEV1 2.49 (84%), FEV1% 79 Labs 08/19/16 >> IgE 120 PFT 07/01/21 >> FEV1 2.40 (84%), FEV1% 80, TLC 7.88 (151%), DLCO 107%, +BD  Chest Imaging:  CT chest 06/27/21 >> LLL 5 mm nodule, scattered ATX and scarring CT chest 06/17/22 >> coronary calcification, mild apical thickening, scar RLL, no change in LLL 5 mm nodule  Social History:  She  reports that she quit smoking about 44 years ago. Her smoking use included cigarettes. She has a 4.00 pack-year smoking history. She has never used smokeless tobacco. She reports that she does not drink alcohol and does not use drugs.  Family History:  Her family history includes Allergies in her brother, father, and mother; Chorea in her maternal grandmother; Clotting disorder in her mother; Diabetes in her father and mother; Heart attack in her father; Heart disease in her father.     Assessment/Plan:   Severe persistent asthma with eosinophilic phenotype. - she has done well with nucala with reduced frequency of exacerbations; she is to continue nucala - continue breztri, singulair - prn albuterol - she has a nebulizer machine  Allergic rhinitis. - continue zyrtec, singulair  Lung nodule  with history of tobacco abuse. - follow up CT chest without contrast in January 2025  Coronary artery calcification. - seen on CT chest - discussed symptoms to monitor for that would indicate she needs further cardiac assessment - discuss diet, exercise - she will follow up with PCP  for cholesterol monitoring  Time Spent Involved in Patient Care on Day of Examination:  36 minutes  Follow up:   Patient Instructions  Follow up in 6 months  Medication List:   Allergies as of 07/30/2022       Reactions   Cymbalta [duloxetine Hcl] Other (See Comments)   serotonin syndrome   Divalproex Sodium Other (See Comments)   unknown   Effexor [venlafaxine Hydrochloride] Other (See Comments)   serotonin syndrome   Geodon [ziprasidone Hydrochloride] Other (See Comments)   Hands and feet tingling.   Prozac [fluoxetine Hcl] Other (See Comments)   suicidal   Thorazine [chlorpromazine Hcl] Other (See Comments)   unknown   Cephalexin Rash   Joint swelling   Cephalosporins Rash   Joint swelling  Keflex   Codeine Nausea Only   Pt states must take phenergan with codeine.      Morphine Nausea Only      Theo-dur [theophylline] Palpitations   Ziprasidone Nausea Only, Other (See Comments)   Hands and feet tingling.        Medication List        Accurate as of July 30, 2022  9:32 AM. If you have any questions, ask your nurse or doctor.          albuterol (2.5 MG/3ML) 0.083% nebulizer solution Commonly known as: PROVENTIL Take 2.5 mg by nebulization every 6 (six) hours as needed for shortness of breath or wheezing.   atorvastatin 10 MG tablet Commonly known as: LIPITOR Take 10 mg by mouth daily.   BLACK ELDERBERRY PO Take 1 each by mouth daily. Elderberry gummy   Breztri Aerosphere 160-9-4.8 MCG/ACT Aero Generic drug: Budeson-Glycopyrrol-Formoterol INHALE 2 PUFFS INTO THE LUNGS IN THE MORNING AND AT BEDTIME.   buPROPion 150 MG 24 hr tablet Commonly known as: WELLBUTRIN XL Take 150 mg by mouth daily.   buPROPion 300 MG 24 hr tablet Commonly known as: WELLBUTRIN XL Take 300 mg by mouth every morning.   carbamazepine 100 MG chewable tablet Commonly known as: TEGRETOL Chew 400 mg by mouth at bedtime.   cetirizine 10 MG tablet Commonly known as:  ZYRTEC Take 10 mg by mouth daily.   docusate sodium 50 MG capsule Commonly known as: COLACE Take 150 mg by mouth 2 (two) times daily.   EPINEPHrine 0.3 mg/0.3 mL Soaj injection Commonly known as: EPI-PEN Inject 0.3 mg into the muscle as needed for anaphylaxis.   lamoTRIgine 150 MG tablet Commonly known as: LAMICTAL Take 300 mg by mouth at bedtime.   meloxicam 15 MG tablet Commonly known as: MOBIC Take 15 mg by mouth daily.   montelukast 10 MG tablet Commonly known as: SINGULAIR Take 10 mg by mouth at bedtime.   Nucala 100 MG/ML Soaj Generic drug: Mepolizumab Inject 1 mL (100 mg total) into the skin every 28 (twenty-eight) days.   pantoprazole 40 MG tablet Commonly known as: PROTONIX Take 40 mg by mouth daily.   promethazine 12.5 MG tablet Commonly known as: PHENERGAN Take 1 tablet (12.5 mg total) by mouth every 6 (six) hours as needed for nausea or vomiting.   rizatriptan 10 MG disintegrating tablet Commonly known as: MAXALT-MLT Take 1 tablet (10 mg total) by  mouth as needed for migraine. May repeat in 2 hours if needed   topiramate 50 MG tablet Commonly known as: TOPAMAX Take 1 tablet (50 mg total) by mouth 2 (two) times daily.   Vitamin B-12 2500 MCG Subl Place 2,500 mcg under the tongue daily.   vitamin C 1000 MG tablet Take 1,000 mg by mouth daily.   Vitamin D3 50 MCG (2000 UT) Caps Generic drug: Cholecalciferol Take 2,000 Units by mouth daily.        Signature:  Chesley Mires, MD [place] Pager - [phone] 07/30/2022, 9:32 AM

## 2022-07-30 NOTE — Patient Instructions (Signed)
Follow up in 6 months 

## 2022-08-06 DIAGNOSIS — N6002 Solitary cyst of left breast: Secondary | ICD-10-CM | POA: Diagnosis not present

## 2022-08-06 DIAGNOSIS — N6324 Unspecified lump in the left breast, lower inner quadrant: Secondary | ICD-10-CM | POA: Diagnosis not present

## 2022-08-07 ENCOUNTER — Encounter: Payer: Self-pay | Admitting: Radiology

## 2022-08-07 ENCOUNTER — Telehealth: Payer: Self-pay | Admitting: *Deleted

## 2022-08-07 MED ORDER — PRIMIDONE 50 MG PO TABS: 50.0000 mg | ORAL_TABLET | Freq: Three times a day (TID) | ORAL | 3 refills | Status: DC

## 2022-08-07 NOTE — Telephone Encounter (Signed)
Called pt back. Per AL,NP, she can do primidone level at next visit. If she develops any sx or problems on medication, she should call and let us know. We would then check the level before next appt. She verbalized understanding.

## 2022-08-07 NOTE — Telephone Encounter (Signed)
-----   Message from Debbora Presto, NP sent at 08/06/2022  4:51 PM EST ----- Can you guys help me make sure she did get primidone 73m TID. We increased dose at last visit but now it looks as if I discontinued??? I ordered primidone level but not sure why this wasn't performed. If she is tolerating ok, we can cancel and we will check at next follow up. TY! ----- Message ----- From: SYSTEM Sent: 07/26/2022  12:13 AM EST To: ADebbora Presto NP

## 2022-08-07 NOTE — Telephone Encounter (Signed)
Called pt. She never got primidone '50mg'$  po TID. Still taking 1 po BID. E-scribed to [facility] per pt request for TID dosing. Aware I will call her back after confirming with AL,NP on whether she needs to come for primidone level now or wait until next appt.

## 2022-08-11 DIAGNOSIS — Z299 Encounter for prophylactic measures, unspecified: Secondary | ICD-10-CM | POA: Diagnosis not present

## 2022-08-11 DIAGNOSIS — R5383 Other fatigue: Secondary | ICD-10-CM | POA: Diagnosis not present

## 2022-08-11 DIAGNOSIS — R0683 Snoring: Secondary | ICD-10-CM | POA: Diagnosis not present

## 2022-08-11 DIAGNOSIS — E559 Vitamin D deficiency, unspecified: Secondary | ICD-10-CM | POA: Diagnosis not present

## 2022-08-11 DIAGNOSIS — R52 Pain, unspecified: Secondary | ICD-10-CM | POA: Diagnosis not present

## 2022-08-19 ENCOUNTER — Other Ambulatory Visit: Payer: Self-pay | Admitting: Diagnostic Neuroimaging

## 2022-08-19 ENCOUNTER — Other Ambulatory Visit: Payer: Self-pay | Admitting: Nurse Practitioner

## 2022-08-19 DIAGNOSIS — J4551 Severe persistent asthma with (acute) exacerbation: Secondary | ICD-10-CM

## 2022-08-26 DIAGNOSIS — I739 Peripheral vascular disease, unspecified: Secondary | ICD-10-CM | POA: Diagnosis not present

## 2022-08-26 DIAGNOSIS — M797 Fibromyalgia: Secondary | ICD-10-CM | POA: Diagnosis not present

## 2022-08-26 DIAGNOSIS — M79604 Pain in right leg: Secondary | ICD-10-CM | POA: Diagnosis not present

## 2022-08-26 DIAGNOSIS — M79605 Pain in left leg: Secondary | ICD-10-CM | POA: Diagnosis not present

## 2022-08-26 DIAGNOSIS — Z299 Encounter for prophylactic measures, unspecified: Secondary | ICD-10-CM | POA: Diagnosis not present

## 2022-09-08 DIAGNOSIS — I70211 Atherosclerosis of native arteries of extremities with intermittent claudication, right leg: Secondary | ICD-10-CM | POA: Diagnosis not present

## 2022-09-12 ENCOUNTER — Other Ambulatory Visit (HOSPITAL_BASED_OUTPATIENT_CLINIC_OR_DEPARTMENT_OTHER): Payer: Self-pay

## 2022-09-12 DIAGNOSIS — R0683 Snoring: Secondary | ICD-10-CM

## 2022-09-12 DIAGNOSIS — M791 Myalgia, unspecified site: Secondary | ICD-10-CM | POA: Diagnosis not present

## 2022-09-12 DIAGNOSIS — M25552 Pain in left hip: Secondary | ICD-10-CM | POA: Diagnosis not present

## 2022-09-12 DIAGNOSIS — M797 Fibromyalgia: Secondary | ICD-10-CM | POA: Diagnosis not present

## 2022-09-12 DIAGNOSIS — M25549 Pain in joints of unspecified hand: Secondary | ICD-10-CM | POA: Diagnosis not present

## 2022-09-12 DIAGNOSIS — Z299 Encounter for prophylactic measures, unspecified: Secondary | ICD-10-CM | POA: Diagnosis not present

## 2022-09-25 DIAGNOSIS — E7800 Pure hypercholesterolemia, unspecified: Secondary | ICD-10-CM | POA: Diagnosis not present

## 2022-09-25 DIAGNOSIS — R5383 Other fatigue: Secondary | ICD-10-CM | POA: Diagnosis not present

## 2022-09-25 DIAGNOSIS — Z0000 Encounter for general adult medical examination without abnormal findings: Secondary | ICD-10-CM | POA: Diagnosis not present

## 2022-09-25 DIAGNOSIS — Z7189 Other specified counseling: Secondary | ICD-10-CM | POA: Diagnosis not present

## 2022-09-25 DIAGNOSIS — Z6831 Body mass index (BMI) 31.0-31.9, adult: Secondary | ICD-10-CM | POA: Diagnosis not present

## 2022-09-25 DIAGNOSIS — Z299 Encounter for prophylactic measures, unspecified: Secondary | ICD-10-CM | POA: Diagnosis not present

## 2022-09-25 DIAGNOSIS — Z79899 Other long term (current) drug therapy: Secondary | ICD-10-CM | POA: Diagnosis not present

## 2022-09-25 DIAGNOSIS — Z1339 Encounter for screening examination for other mental health and behavioral disorders: Secondary | ICD-10-CM | POA: Diagnosis not present

## 2022-09-25 DIAGNOSIS — Z1331 Encounter for screening for depression: Secondary | ICD-10-CM | POA: Diagnosis not present

## 2022-09-30 ENCOUNTER — Ambulatory Visit: Payer: PPO | Attending: Internal Medicine | Admitting: Pulmonary Disease

## 2022-09-30 DIAGNOSIS — G4733 Obstructive sleep apnea (adult) (pediatric): Secondary | ICD-10-CM

## 2022-09-30 DIAGNOSIS — R0683 Snoring: Secondary | ICD-10-CM | POA: Diagnosis not present

## 2022-10-01 DIAGNOSIS — Z299 Encounter for prophylactic measures, unspecified: Secondary | ICD-10-CM | POA: Diagnosis not present

## 2022-10-01 DIAGNOSIS — R809 Proteinuria, unspecified: Secondary | ICD-10-CM | POA: Diagnosis not present

## 2022-10-01 DIAGNOSIS — E1129 Type 2 diabetes mellitus with other diabetic kidney complication: Secondary | ICD-10-CM | POA: Diagnosis not present

## 2022-10-01 DIAGNOSIS — E119 Type 2 diabetes mellitus without complications: Secondary | ICD-10-CM | POA: Diagnosis not present

## 2022-10-06 DIAGNOSIS — E119 Type 2 diabetes mellitus without complications: Secondary | ICD-10-CM | POA: Diagnosis not present

## 2022-10-06 DIAGNOSIS — M5106 Intervertebral disc disorders with myelopathy, lumbar region: Secondary | ICD-10-CM | POA: Diagnosis not present

## 2022-10-06 DIAGNOSIS — M4716 Other spondylosis with myelopathy, lumbar region: Secondary | ICD-10-CM | POA: Diagnosis not present

## 2022-10-06 DIAGNOSIS — M5412 Radiculopathy, cervical region: Secondary | ICD-10-CM | POA: Diagnosis not present

## 2022-10-06 DIAGNOSIS — M5416 Radiculopathy, lumbar region: Secondary | ICD-10-CM | POA: Diagnosis not present

## 2022-10-07 ENCOUNTER — Other Ambulatory Visit: Payer: Self-pay | Admitting: Orthopedic Surgery

## 2022-10-07 DIAGNOSIS — M4716 Other spondylosis with myelopathy, lumbar region: Secondary | ICD-10-CM

## 2022-10-07 DIAGNOSIS — M5412 Radiculopathy, cervical region: Secondary | ICD-10-CM

## 2022-10-07 DIAGNOSIS — J45909 Unspecified asthma, uncomplicated: Secondary | ICD-10-CM | POA: Diagnosis not present

## 2022-10-07 DIAGNOSIS — E785 Hyperlipidemia, unspecified: Secondary | ICD-10-CM | POA: Diagnosis not present

## 2022-10-07 DIAGNOSIS — M5106 Intervertebral disc disorders with myelopathy, lumbar region: Secondary | ICD-10-CM

## 2022-10-10 DIAGNOSIS — R5383 Other fatigue: Secondary | ICD-10-CM | POA: Diagnosis not present

## 2022-10-10 DIAGNOSIS — G4700 Insomnia, unspecified: Secondary | ICD-10-CM | POA: Diagnosis not present

## 2022-10-10 DIAGNOSIS — Z299 Encounter for prophylactic measures, unspecified: Secondary | ICD-10-CM | POA: Diagnosis not present

## 2022-10-10 DIAGNOSIS — M797 Fibromyalgia: Secondary | ICD-10-CM | POA: Diagnosis not present

## 2022-10-14 DIAGNOSIS — G4733 Obstructive sleep apnea (adult) (pediatric): Secondary | ICD-10-CM | POA: Diagnosis not present

## 2022-10-14 DIAGNOSIS — R0683 Snoring: Secondary | ICD-10-CM

## 2022-10-14 NOTE — Procedures (Signed)
Patient Name: Nancy [name], Nancy [name] Date: 09/30/2022 Gender: Female D.O.B: 02-27-1966 Age (years): 40 Referring Provider: Ignatius Specking Height (inches): 64 Interpreting Physician: Cyril Mourning MD, ABSM Weight (lbs): 175 RPSGT: Alfonso Ellis BMI: 30 MRN: [id number] Neck Size: 14.50 <br> <br> CLINICAL INFORMATION Sleep Study Type: NPSG    Indication for sleep study: Snoring    Epworth Sleepiness Score: 8    SLEEP STUDY TECHNIQUE As per the AASM Manual for the Scoring of Sleep and Associated Events v2.3 (April 2016) with a hypopnea requiring 4% desaturations.  The channels recorded and monitored were frontal, central and occipital EEG, electrooculogram (EOG), submentalis EMG (chin), nasal and oral airflow, thoracic and abdominal wall motion, anterior tibialis EMG, snore microphone, electrocardiogram, and pulse oximetry.  MEDICATIONS Medications self-administered by patient taken the night of the study : ATORVASTATIN, CARBAMAZEPINE, LAMICTAL, MELATONIN, MONTELUKAST, PRIMIDONE  SLEEP ARCHITECTURE The study was initiated at 10:59:15 PM and ended at 5:34:14 AM.  Sleep onset time was 17.8 minutes and the sleep efficiency was 90.4%. The total sleep time was 357 minutes.  Stage REM latency was 106.0 minutes.  The patient spent 3.36% of the night in stage N1 sleep, 41.46% in stage N2 sleep, 37.68% in stage N3 and 17.5% in REM.  Alpha intrusion was absent.  Supine sleep was 8.38%.  RESPIRATORY PARAMETERS The overall apnea/hypopnea index (AHI) was 8.1 per hour. There were 1 total apneas, including 1 obstructive, 0 central and 0 mixed apneas. There were 47 hypopneas and 0 RERAs.  The AHI during Stage REM sleep was 34.6 per hour.  AHI while supine was 50.1 per hour.  The mean oxygen saturation was 92.31%. The minimum SpO2 during sleep was 84.00%.  moderate snoring was noted during this study.  CARDIAC DATA The 2 lead EKG demonstrated sinus rhythm. The mean heart rate was  69.00 beats per minute. Other EKG findings include: None.   LEG MOVEMENT DATA The total PLMS were 0 with a resulting PLMS index of 0.00. Associated arousal with leg movement index was 0.0 .  IMPRESSIONS - Mild obstructive sleep apnea occurred during this study (AHI = 8.1/h). Events were noted mainly during supine & REM sleep. - Mild oxygen desaturation was noted during this study (Min O2 = 84.00%). - The patient snored with moderate snoring volume. - No cardiac abnormalities were noted during this study. - Clinically significant periodic limb movements did not occur during sleep. No significant associated arousals.   DIAGNOSIS - Obstructive Sleep Apnea (G47.33)   RECOMMENDATIONS - Positional therapy avoiding supine position during sleep should suffice for treatment of very mild sleep apnea. Consider dental appliance or CPAP therapy only if very symptomatic. - Avoid alcohol, sedatives and other CNS depressants that may worsen sleep apnea and disrupt normal sleep architecture. - Sleep hygiene should be reviewed to assess factors that may improve sleep quality. - Weight management and regular exercise should be initiated or continued if appropriate.    Cyril Mourning MD Board Certified in Sleep medicine

## 2022-10-20 ENCOUNTER — Encounter (HOSPITAL_BASED_OUTPATIENT_CLINIC_OR_DEPARTMENT_OTHER): Payer: PPO | Admitting: Internal Medicine

## 2022-11-03 ENCOUNTER — Ambulatory Visit
Admission: EM | Admit: 2022-11-03 | Discharge: 2022-11-03 | Disposition: A | Discharge status: Home / Self Care | Payer: PPO

## 2022-11-03 DIAGNOSIS — S46912A Strain of unspecified muscle, fascia and tendon at shoulder and upper arm level, left arm, initial encounter: Secondary | ICD-10-CM

## 2022-11-03 MED ORDER — KETOROLAC TROMETHAMINE 30 MG/ML IJ SOLN
30.0000 mg | Freq: Once | INTRAMUSCULAR | Status: AC
  Administered 2022-11-03: 30 mg via INTRAMUSCULAR

## 2022-11-03 MED ORDER — TIZANIDINE HCL 4 MG PO TABS: 4.0000 mg | ORAL_TABLET | Freq: Three times a day (TID) | ORAL | 0 refills | Status: DC | PRN

## 2022-11-03 NOTE — Discharge Instructions (Addendum)
We have given you an injection of Toradol today for pain and inflammation.  You can take Tylenol 500 to 1000 mg every 6 hours as needed for pain.  In addition if you are still in pain, take the tizanidine as needed for muscular pain.  Start icing the shoulder 15 minutes on, 45 minutes off every hour while awake.  Follow-up with orthopedic provider with no improvement or worsening of symptoms despite treatment.

## 2022-11-03 NOTE — ED Provider Notes (Signed)
[facility]    CSN: [id number] Arrival date & time: 11/03/22  1416      History   Chief Complaint No chief complaint on file.   HPI Nancy [name] is a 57 y.o. female.   Patient presents today with 2-day history of left shoulder pain.  She denies recent fall, accident, trauma, or injury to the shoulder.  Reports they just returned from a trip to [place] and they went to 3 amusement parks in their time in [place].  She reports the pain is at the level of her shoulder and moves down her arm slightly.  The pain is worse with movement, at rest the pain is a 4-5 out of 10 and with any movement increases sharply significantly to severe pain.  Pain is worse with movement, better with rest.  Reports pain has improved slightly since it began.  Has been taking Tylenol and ibuprofen for pain, however has not taken anything today so far.  Denies weakness, numbness or tingling in the arm, decreased grip strength, redness, swelling, bruising, or fevers, nausea/vomiting the pain began.     Past Medical History:  Diagnosis Date   Allergic rhinitis    Asthma    Benign familial tremor    Bipolar 1 disorder (HCC)    Chronic neck and back pain    Degenerative disk disease    DJD (degenerative joint disease), lumbar    Dyslipidemia    Fibromyalgia    GERD (gastroesophageal reflux disease)    Hiatal hernia    History of kidney stones    Irritable bowel syndrome (IBS)    Memory disorder 07/18/2014   Memory loss    Migraine headache    Nephrolithiasis    Pneumonia 1986   Serotonin syndrome    Tremor    hands,arms    Patient Active Problem List   Diagnosis Date Noted   Acute medial meniscus tear of left knee    GERD (gastroesophageal reflux disease) 06/13/2021   Allergic rhinitis 12/30/2019   Preoperative clearance 11/09/2018   Memory disorder 07/18/2014   Chest pain of uncertain etiology 02/01/2014   Severe persistent asthma with acute bronchitis and acute exacerbation  03/04/2013   Exercise-induced asthma 07/30/2012   Severe persistent asthma in adult without complication 03/12/2011    Past Surgical History:  Procedure Laterality Date   ANKLE FRACTURE SURGERY  2006   Right   APPENDECTOMY     back fusion     BACK SURGERY     BRONCHOSCOPY     S. aureus from BAL   CARDIAC CATHETERIZATION  11/27/2006   minimal CAD   CHOLECYSTECTOMY     COLONOSCOPY WITH PROPOFOL N/A 12/12/2020   Procedure: COLONOSCOPY WITH PROPOFOL;  Surgeon: Malissa Hippo, MD;  Location: [facility];  Service: Endoscopy;  Laterality: N/A;  820   ESOPHAGEAL DILATION  08/06/2004   KNEE ARTHROSCOPY WITH MEDIAL MENISECTOMY Left 08/09/2021   Procedure: KNEE ARTHROSCOPY WITH PARTIAL MEDIAL MENISCECTOMY;  Surgeon: Vickki Hearing, MD;  Location: [facility];  Service: Orthopedics;  Laterality: Left;   LAPAROSCOPIC CHOLECYSTECTOMY     LASER ABLATION OF THE CERVIX     MICRODISSECTION L5-S1  08/13/2000   neck fusion     TONSILLECTOMY      OB History   No obstetric history on file.      Home Medications    Prior to Admission medications   Medication Sig Start Date End Date Taking? Authorizing Provider  methocarbamol (ROBAXIN) 500 MG  tablet Take 500 mg by mouth 3 (three) times daily. 10/19/22  Yes [provider]  tiZANidine (ZANAFLEX) 4 MG tablet Take 1 tablet (4 mg total) by mouth every 8 (eight) hours as needed for muscle spasms. Do not take with alcohol or while driving or operating heavy machinery.  May cause drowsiness. 11/03/22  Yes Cathlean Marseilles A, NP  albuterol (PROVENTIL) (2.5 MG/3ML) 0.083% nebulizer solution Take 2.5 mg by nebulization every 6 (six) hours as needed for shortness of breath or wheezing.    [provider]  Ascorbic Acid (VITAMIN C) 1000 MG tablet Take 1,000 mg by mouth daily.    [provider]  atorvastatin (LIPITOR) 10 MG tablet Take 10 mg by mouth daily.    [provider]  BLACK ELDERBERRY PO Take 1 each by mouth  daily. Elderberry gummy    [provider]  BREZTRI AEROSPHERE 160-9-4.8 MCG/ACT AERO INHALE 2 PUFFS INTO THE LUNGS IN THE MORNING AND AT BEDTIME. 08/28/22   Coralyn Helling, MD  buPROPion (WELLBUTRIN XL) 150 MG 24 hr tablet Take 150 mg by mouth daily.    [provider]  buPROPion (WELLBUTRIN XL) 300 MG 24 hr tablet Take 300 mg by mouth every morning. 11/20/20   [provider]  carbamazepine (TEGRETOL) 100 MG chewable tablet Chew 400 mg by mouth at bedtime. 11/23/20   [provider]  cetirizine (ZYRTEC) 10 MG tablet Take 10 mg by mouth daily.    [provider]  Cholecalciferol (VITAMIN D3) 50 MCG (2000 UT) capsule Take 2,000 Units by mouth daily.    [provider]  Cyanocobalamin (VITAMIN B-12) 2500 MCG SUBL Place 2,500 mcg under the tongue daily.    [provider]  cyclobenzaprine (FLEXERIL) 10 MG tablet Take 10 mg by mouth at bedtime.    [provider]  docusate sodium (COLACE) 50 MG capsule Take 150 mg by mouth 2 (two) times daily.    [provider]  EPINEPHrine 0.3 mg/0.3 mL IJ SOAJ injection Inject 0.3 mg into the muscle as needed for anaphylaxis.    [provider]  lamoTRIgine (LAMICTAL) 150 MG tablet Take 300 mg by mouth at bedtime.    [provider]  meloxicam (MOBIC) 15 MG tablet Take 15 mg by mouth daily. 05/19/20   [provider]  Mepolizumab (NUCALA) 100 MG/ML SOAJ Inject 1 mL (100 mg total) into the skin every 28 (twenty-eight) days. 02/13/22   Coralyn Helling, MD  montelukast (SINGULAIR) 10 MG tablet Take 10 mg by mouth at bedtime.    [provider]  pantoprazole (PROTONIX) 40 MG tablet Take 40 mg by mouth daily.    [provider]  primidone (MYSOLINE) 50 MG tablet Take 1 tablet (50 mg total) by mouth 3 (three) times daily. 08/07/22   Lomax, Amy, NP  promethazine (PHENERGAN) 12.5 MG tablet Take 1 tablet (12.5 mg total) by mouth every 6 (six) hours as needed for  nausea or vomiting. 08/09/21   Vickki Hearing, MD  rizatriptan (MAXALT-MLT) 10 MG disintegrating tablet Take 1 tablet (10 mg total) by mouth as needed for migraine. May repeat in 2 hours if needed 01/14/22   Penumalli, Glenford Bayley, MD  topiramate (TOPAMAX) 50 MG tablet Take 1 tablet (50 mg total) by mouth 2 (two) times daily. 07/21/22   Shawnie Dapper, NP    Family History Family History  Problem Relation Age of Onset   Allergies Mother    Clotting disorder Mother    Diabetes  Mother    Allergies Father    Heart disease Father    Heart attack Father    Diabetes Father    Allergies Brother    Chorea Maternal Grandmother     Social History Social History   Tobacco Use   Smoking status: Former    Packs/day: 0.50    Years: 8.00    Additional pack years: 0.00    Total pack years: 4.00    Types: Cigarettes    Quit date: 06/09/1978    Years since quitting: 44.4   Smokeless tobacco: Never  Vaping Use   Vaping Use: Never used  Substance Use Topics   Alcohol use: No   Drug use: No     Allergies   Cymbalta [duloxetine hcl], Divalproex sodium, Effexor [venlafaxine hydrochloride], Geodon [ziprasidone hydrochloride], Prozac [fluoxetine hcl], Thorazine [chlorpromazine hcl], Cephalexin, Cephalosporins, Codeine, Morphine, Theo-dur [theophylline], and Ziprasidone   Review of Systems Review of Systems Per HPI  Physical Exam Triage Vital Signs ED Triage Vitals  Enc Vitals Group     BP 11/03/22 1456 135/85     Pulse Rate 11/03/22 1456 86     Resp 11/03/22 1456 16     Temp 11/03/22 1456 97.9 F (36.6 C)     Temp Source 11/03/22 1456 Oral     SpO2 11/03/22 1456 95 %     Weight --      Height --      Head Circumference --      Peak Flow --      Pain Score 11/03/22 1451 5     Pain Loc --      Pain Edu? --      Excl. in GC? --    No data found.  Updated Vital Signs BP 135/85 (BP Location: Right Arm)   Pulse 86   Temp 97.9 F (36.6 C) (Oral)   Resp 16   LMP 11/28/2013 Comment:  IUD  SpO2 95%   Visual Acuity Right Eye Distance:   Left Eye Distance:   Bilateral Distance:    Right Eye Near:   Left Eye Near:    Bilateral Near:     Physical Exam Vitals and nursing note reviewed.  Constitutional:      General: She is not in acute distress.    Appearance: Normal appearance. She is not toxic-appearing.  HENT:     Mouth/Throat:     Mouth: Mucous membranes are moist.     Pharynx: Oropharynx is clear.  Pulmonary:     Effort: Pulmonary effort is normal. No respiratory distress.  Musculoskeletal:     Comments: Inspection: No swelling, obvious deformity, redness, or bruising to left shoulder and deltoid  Palpation: Left shoulder and deltoid nontender to touch; no obvious deformities ROM: Difficult to assess secondary to pain; patient has passive range of motion, however it is significantly painful Strength: 5/5 bilateral upper extremities Neurovascular: neurovascularly intact in distal left and right upper extremity   Skin:    General: Skin is warm and dry.     Capillary Refill: Capillary refill takes less than 2 seconds.     Coloration: Skin is not jaundiced or pale.     Findings: No erythema.  Neurological:     Mental Status: She is alert and oriented to person, place, and time.  Psychiatric:        Behavior: Behavior is cooperative.      UC Treatments / Results  Labs (all labs ordered are listed, but only abnormal results are  displayed) Labs Reviewed - No data to display  EKG   Radiology No results found.  Procedures Procedures (including critical care time)  Medications Ordered in UC Medications  ketorolac (TORADOL) 30 MG/ML injection 30 mg (has no administration in time range)    Initial Impression / Assessment and Plan / UC Course  I have reviewed the triage vital signs and the nursing notes.  Pertinent labs & imaging results that were available during my care of the patient were reviewed by me and considered in my medical decision  making (see chart for details).   Patient is well-appearing, normotensive, afebrile, not tachycardic, not tachypneic, oxygenating well on room air.    1. Strain of left shoulder, initial encounter Treat with Toradol 30 mg IM today in urgent care Avoid NSAIDs for 48 hours, then resume ibuprofen 400 - 800 mg every 8 hours as needed for pain Start muscle relaxant medication, light stretching/range of motion exercises, ice as needed for pain 15 minutes on, 45 minutes off every hour while awake Recommended follow-up with orthopedic provider with no improvement or worsening of symptoms despite treatment  The patient was given the opportunity to ask questions.  All questions answered to their satisfaction.  The patient is in agreement to this plan.    Final Clinical Impressions(s) / UC Diagnoses   Final diagnoses:  Strain of left shoulder, initial encounter     Discharge Instructions      We have given you an injection of Toradol today for pain and inflammation.  You can take Tylenol 500 to 1000 mg every 6 hours as needed for pain.  In addition if you are still in pain, take the tizanidine as needed for muscular pain.  Start icing the shoulder 15 minutes on, 45 minutes off every hour while awake.  Follow-up with orthopedic provider with no improvement or worsening of symptoms despite treatment.     ED Prescriptions     Medication Sig Dispense Auth. Provider   tiZANidine (ZANAFLEX) 4 MG tablet Take 1 tablet (4 mg total) by mouth every 8 (eight) hours as needed for muscle spasms. Do not take with alcohol or while driving or operating heavy machinery.  May cause drowsiness. 30 tablet Valentino Nose, NP      PDMP not reviewed this encounter.   Valentino Nose, NP 11/03/22 [phone]

## 2022-11-03 NOTE — ED Triage Notes (Signed)
Pt reports she is having left side shoulder pain x 2 days.  Pt reports no injury but did have an active weekend. Left shoulder hurts more with ROM.   Pt reports she sometimes feel like she has a "ball" under her armpit.

## 2022-11-04 DIAGNOSIS — M25512 Pain in left shoulder: Secondary | ICD-10-CM | POA: Diagnosis not present

## 2022-11-12 DIAGNOSIS — Z01419 Encounter for gynecological examination (general) (routine) without abnormal findings: Secondary | ICD-10-CM | POA: Diagnosis not present

## 2022-11-12 DIAGNOSIS — Z975 Presence of (intrauterine) contraceptive device: Secondary | ICD-10-CM | POA: Diagnosis not present

## 2022-11-12 DIAGNOSIS — N958 Other specified menopausal and perimenopausal disorders: Secondary | ICD-10-CM | POA: Diagnosis not present

## 2022-11-20 ENCOUNTER — Encounter: Payer: PPO | Admitting: Orthopedic Surgery

## 2022-11-21 DIAGNOSIS — M62838 Other muscle spasm: Secondary | ICD-10-CM | POA: Diagnosis not present

## 2022-11-21 DIAGNOSIS — M7552 Bursitis of left shoulder: Secondary | ICD-10-CM | POA: Diagnosis not present

## 2022-11-21 DIAGNOSIS — R5383 Other fatigue: Secondary | ICD-10-CM | POA: Diagnosis not present

## 2022-11-21 DIAGNOSIS — Z299 Encounter for prophylactic measures, unspecified: Secondary | ICD-10-CM | POA: Diagnosis not present

## 2022-11-21 DIAGNOSIS — M797 Fibromyalgia: Secondary | ICD-10-CM | POA: Diagnosis not present

## 2022-11-25 DIAGNOSIS — Z30432 Encounter for removal of intrauterine contraceptive device: Secondary | ICD-10-CM | POA: Diagnosis not present

## 2022-11-27 ENCOUNTER — Ambulatory Visit (INDEPENDENT_AMBULATORY_CARE_PROVIDER_SITE_OTHER): Payer: PPO | Admitting: Orthopedic Surgery

## 2022-11-27 ENCOUNTER — Other Ambulatory Visit (INDEPENDENT_AMBULATORY_CARE_PROVIDER_SITE_OTHER): Payer: PPO

## 2022-11-27 ENCOUNTER — Encounter: Payer: Self-pay | Admitting: Orthopedic Surgery

## 2022-11-27 VITALS — BP 140/86 | HR 93 | Ht 64.5 in | Wt 172.0 lb

## 2022-11-27 DIAGNOSIS — M25512 Pain in left shoulder: Secondary | ICD-10-CM | POA: Diagnosis not present

## 2022-11-27 DIAGNOSIS — S43432A Superior glenoid labrum lesion of left shoulder, initial encounter: Secondary | ICD-10-CM | POA: Diagnosis not present

## 2022-11-27 DIAGNOSIS — M1990 Unspecified osteoarthritis, unspecified site: Secondary | ICD-10-CM

## 2022-11-27 DIAGNOSIS — M24012 Loose body in left shoulder: Secondary | ICD-10-CM

## 2022-11-27 MED ORDER — PREDNISONE 10 MG PO TABS
10.0000 mg | ORAL_TABLET | Freq: Three times a day (TID) | ORAL | 0 refills | Status: AC
Start: 2022-11-27 — End: ?

## 2022-11-27 NOTE — Progress Notes (Signed)
Chief Complaint  Patient presents with   Shoulder Pain    L shoulder pain DOI 11/01/22 went to UC on 5/27 and got a shot of toradol and an injection in the shoulder on 5/28 by [facility] and again on 6/14 by her rheumatologist. She did have xrays at [facility] that showed arthritis. Pain is a little better but still in significant pain.    57 year old female with fibromyalgia status post surgical fusion presents with the following history of acute onset of left shoulder pain  The patient was in [place] she was riding roller coasters she was doing fine after she got off the roller coaster and as she was walking she moved her arm and felt acute pain in the left shoulder.  It was so severe that she could not move her arm and her husband had to help her get dressed.  Upon return to [place] she was seen at urgent care she got an IM injection of Toradol on the 27th this provided no relief.  She was already on muscle relaxers and meloxicam and they were not helping her pain  [facility] then [facility] to [facility] on the 28th as I was not available to see her she had x-rays which showed some arthritis of the acromioclavicular joint and very little amount of arthritis of the shoulder joint.  She got a subacromial injection which helped a little bit more than the IM Toradol but this still did not relieve her symptoms  On June 14 she had a appointment with rheumatology and they gave her an injection which seemed to help more than the other 2 especially with range of motion although the range of motion did not return to normal.  She has a lot of nocturnal pain in the left upper extremity just feels weak  Physical Exam Skin:    General: Skin is warm and dry.     Capillary Refill: Capillary refill takes less than 2 seconds.  Neurological:     Mental Status: She is alert and oriented to person, place, and time.     Sensory: No sensory deficit.     Gait: Gait normal.  Psychiatric:        Mood and  Affect: Mood normal.        Behavior: Behavior normal.    Careful examination of the left shoulder there is no atrophy there is a bruise on the lateral side of the arm just distal to the deltoid insertion.  There is no tenderness anteriorly laterally or posteriorly.  She has good subscapularis function with the belly press test no pain throughout the range of motion and external rotation but pain at terminal extension but 5 out of 5 strength of the external rotators  The pain is notable at just about 90 degrees of abduction and at 30 degrees of forward elevation.  She says it feels deep in the shoulder.  No instability no impingement  Imaging had to be repeated because I did not have the x-rays from [facility] I see very little arthritis at the inferior aspect of the glenohumeral joint with a normal glenohumeral joint space.  At the inferior aspect there are 2 bone spurs 1 on each side  There is extensive arthritic change at the [employer] joint but she is not having pain there  Differential diagnosis and discussion  At this point it is unclear what the cause of this is.  I doubt she tore her rotator cuff based on her history and  her exam symptoms were acute in onset  I think it is some type of inflammatory arthritis or loose body possibly  Recommend prednisone and MRI left shoulder  Encounter Diagnoses  Name Primary?   Acute pain of left shoulder Yes   Inflammatory arthritis    Loose body in left shoulder    Tear of left glenoid labrum, initial encounter     Meds ordered this encounter  Medications   predniSONE (DELTASONE) 10 MG tablet    Sig: Take 1 tablet (10 mg total) by mouth 3 (three) times daily.    Dispense:  42 tablet    Refill:  0

## 2022-11-27 NOTE — Patient Instructions (Signed)
While we are working on your approval for MRI please go ahead and call to schedule your appointment with [facility] within at least one (1) week.   Central Scheduling [phone]  

## 2022-11-28 ENCOUNTER — Ambulatory Visit (HOSPITAL_COMMUNITY)
Admission: RE | Admit: 2022-11-28 | Discharge: 2022-11-28 | Disposition: A | Discharge status: Home / Self Care | Payer: PPO | Source: Ambulatory Visit | Attending: Orthopedic Surgery | Admitting: Orthopedic Surgery

## 2022-11-28 DIAGNOSIS — M19012 Primary osteoarthritis, left shoulder: Secondary | ICD-10-CM | POA: Diagnosis not present

## 2022-11-28 DIAGNOSIS — M7582 Other shoulder lesions, left shoulder: Secondary | ICD-10-CM | POA: Diagnosis not present

## 2022-11-28 DIAGNOSIS — M25512 Pain in left shoulder: Secondary | ICD-10-CM | POA: Diagnosis not present

## 2022-12-03 DIAGNOSIS — F319 Bipolar disorder, unspecified: Secondary | ICD-10-CM | POA: Diagnosis not present

## 2022-12-03 DIAGNOSIS — Z299 Encounter for prophylactic measures, unspecified: Secondary | ICD-10-CM | POA: Diagnosis not present

## 2022-12-03 DIAGNOSIS — I739 Peripheral vascular disease, unspecified: Secondary | ICD-10-CM | POA: Diagnosis not present

## 2022-12-03 DIAGNOSIS — Z0000 Encounter for general adult medical examination without abnormal findings: Secondary | ICD-10-CM | POA: Diagnosis not present

## 2022-12-05 DIAGNOSIS — T8332XS Displacement of intrauterine contraceptive device, sequela: Secondary | ICD-10-CM | POA: Diagnosis not present

## 2022-12-05 DIAGNOSIS — N882 Stricture and stenosis of cervix uteri: Secondary | ICD-10-CM | POA: Diagnosis not present

## 2022-12-08 DIAGNOSIS — F419 Anxiety disorder, unspecified: Secondary | ICD-10-CM | POA: Diagnosis not present

## 2022-12-08 DIAGNOSIS — Z79899 Other long term (current) drug therapy: Secondary | ICD-10-CM | POA: Diagnosis not present

## 2022-12-08 DIAGNOSIS — G43909 Migraine, unspecified, not intractable, without status migrainosus: Secondary | ICD-10-CM | POA: Diagnosis not present

## 2022-12-08 DIAGNOSIS — Z7951 Long term (current) use of inhaled steroids: Secondary | ICD-10-CM | POA: Diagnosis not present

## 2022-12-08 DIAGNOSIS — G4733 Obstructive sleep apnea (adult) (pediatric): Secondary | ICD-10-CM | POA: Diagnosis not present

## 2022-12-08 DIAGNOSIS — Z87891 Personal history of nicotine dependence: Secondary | ICD-10-CM | POA: Diagnosis not present

## 2022-12-08 DIAGNOSIS — T8339XA Other mechanical complication of intrauterine contraceptive device, initial encounter: Secondary | ICD-10-CM | POA: Diagnosis not present

## 2022-12-08 DIAGNOSIS — J4550 Severe persistent asthma, uncomplicated: Secondary | ICD-10-CM | POA: Diagnosis not present

## 2022-12-08 DIAGNOSIS — M797 Fibromyalgia: Secondary | ICD-10-CM | POA: Diagnosis not present

## 2022-12-08 DIAGNOSIS — K219 Gastro-esophageal reflux disease without esophagitis: Secondary | ICD-10-CM | POA: Diagnosis not present

## 2022-12-08 DIAGNOSIS — Z9049 Acquired absence of other specified parts of digestive tract: Secondary | ICD-10-CM | POA: Diagnosis not present

## 2022-12-08 DIAGNOSIS — F319 Bipolar disorder, unspecified: Secondary | ICD-10-CM | POA: Diagnosis not present

## 2022-12-08 DIAGNOSIS — E7800 Pure hypercholesterolemia, unspecified: Secondary | ICD-10-CM | POA: Diagnosis not present

## 2022-12-08 DIAGNOSIS — Z981 Arthrodesis status: Secondary | ICD-10-CM | POA: Diagnosis not present

## 2022-12-08 DIAGNOSIS — T8331XA Breakdown (mechanical) of intrauterine contraceptive device, initial encounter: Secondary | ICD-10-CM | POA: Diagnosis not present

## 2022-12-08 DIAGNOSIS — Z30432 Encounter for removal of intrauterine contraceptive device: Secondary | ICD-10-CM | POA: Diagnosis not present

## 2022-12-08 DIAGNOSIS — I2510 Atherosclerotic heart disease of native coronary artery without angina pectoris: Secondary | ICD-10-CM | POA: Diagnosis not present

## 2022-12-08 DIAGNOSIS — N882 Stricture and stenosis of cervix uteri: Secondary | ICD-10-CM | POA: Diagnosis not present

## 2022-12-08 DIAGNOSIS — T8332XA Displacement of intrauterine contraceptive device, initial encounter: Secondary | ICD-10-CM | POA: Diagnosis not present

## 2022-12-09 ENCOUNTER — Telehealth: Payer: Self-pay | Admitting: Radiology

## 2022-12-09 ENCOUNTER — Telehealth (INDEPENDENT_AMBULATORY_CARE_PROVIDER_SITE_OTHER): Payer: PPO | Admitting: Orthopedic Surgery

## 2022-12-09 DIAGNOSIS — M19012 Primary osteoarthritis, left shoulder: Secondary | ICD-10-CM

## 2022-12-09 NOTE — Telephone Encounter (Signed)
Monday 8th arrive 1030 for the injection I called patient to advise she voiced understanding

## 2022-12-09 NOTE — Telephone Encounter (Signed)
-----   Message from Vickki Hearing, MD sent at 12/09/2022 10:55 AM EDT ----- Patient has arthritis left shoulder.  I gave her her MRI results.  Set up intra-articular injection of steroids Depo-Medrol 40 and lidocaine 1% into the left shoulder joint in [place]

## 2022-12-09 NOTE — Telephone Encounter (Signed)
Called to schedule

## 2022-12-09 NOTE — Telephone Encounter (Signed)
Put in referral will call to schedule

## 2022-12-09 NOTE — Telephone Encounter (Signed)
MRI results given  Patient's arm shoulder still hurting  MRI shows tendinosis and arthritis of the shoulder glenohumeral joint and acromioclavicular joint  Patient agreeable to try intra-articular steroid injection in [place]

## 2022-12-15 ENCOUNTER — Ambulatory Visit (HOSPITAL_COMMUNITY)
Admission: RE | Admit: 2022-12-15 | Discharge: 2022-12-15 | Disposition: A | Discharge status: Home / Self Care | Payer: PPO | Source: Ambulatory Visit | Attending: Orthopedic Surgery | Admitting: Orthopedic Surgery

## 2022-12-15 ENCOUNTER — Ambulatory Visit: Payer: PPO | Admitting: Orthopedic Surgery

## 2022-12-15 DIAGNOSIS — M19012 Primary osteoarthritis, left shoulder: Secondary | ICD-10-CM | POA: Insufficient documentation

## 2022-12-15 DIAGNOSIS — M25512 Pain in left shoulder: Secondary | ICD-10-CM | POA: Diagnosis not present

## 2022-12-15 MED ORDER — LIDOCAINE HCL (PF) 1 % IJ SOLN
5.0000 mL | Freq: Once | INTRAMUSCULAR | Status: AC
  Administered 2022-12-15: 5 mL via SUBCUTANEOUS

## 2022-12-15 MED ORDER — BUPIVACAINE HCL (PF) 0.5 % IJ SOLN
3.0000 mL | Freq: Once | INTRAMUSCULAR | Status: AC
  Administered 2022-12-15: 3 mL via INTRA_ARTICULAR

## 2022-12-15 MED ORDER — METHYLPREDNISOLONE ACETATE 40 MG/ML INJ SUSP (RADIOLOG
40.0000 mg | Freq: Once | INTRAMUSCULAR | Status: AC
  Administered 2022-12-15: 40 mg via INTRA_ARTICULAR

## 2022-12-15 MED ORDER — BUPIVACAINE HCL (PF) 0.5 % IJ SOLN
INTRAMUSCULAR | Status: AC
  Filled 2022-12-15: qty 30

## 2022-12-15 MED ORDER — LIDOCAINE HCL (PF) 1 % IJ SOLN
INTRAMUSCULAR | Status: AC
  Filled 2022-12-15: qty 5

## 2022-12-15 MED ORDER — METHYLPREDNISOLONE ACETATE 40 MG/ML IJ SUSP
INTRAMUSCULAR | Status: AC
  Filled 2022-12-15: qty 1

## 2022-12-15 MED ORDER — IOHEXOL 180 MG/ML  SOLN
5.0000 mL | Freq: Once | INTRAMUSCULAR | Status: AC | PRN
  Administered 2022-12-15: 5 mL via INTRA_ARTICULAR

## 2022-12-15 NOTE — Procedures (Signed)
CLINICAL DATA: Left shoulder pain starting 11/01/2022 EXAM: THERAPEUTIC LEFT GLENOHUMERAL JOINT INJECTION UNDER FLUOROSCOPY COMPARISON: LEFT SHOULDER MRI 11/28/2022 FLUOROSCOPY: Radiation Exposure Index (as provided by the fluoroscopic device): 4.7 mGy Kerma PROCEDURE:  I discussed the risks (including hemorrhage, infection, and allergic reaction, among others), benefits, and alternatives to the procedure with the patient.  We specifically discussed the high technical likelihood of success of the procedure.  The patient understood and elected to undergo the procedure.    Standard time-out was employed.  Following sterile skin prep and local anesthetic administration consisting of 1% lidocaine, a 22 gauge needle was advanced without difficulty into the left glenohumeral joint under fluoroscopic guidance.  Initial injection of 1-2 cc of Omnipaque 180 confirmed intra-articular position of the needle tip.  Subsequently a total of 5 cc Marcaine and 40 mg of Depo-Medrol was injected into the glenohumeral joint without resistance.  The needle was subsequently removed and the skin cleansed and bandaged.  No immediate complications were observed.   IMPRESSION:  Technically successful therapeutic left glenohumeral joint injection under fluoroscopy.

## 2022-12-16 DIAGNOSIS — Z09 Encounter for follow-up examination after completed treatment for conditions other than malignant neoplasm: Secondary | ICD-10-CM | POA: Diagnosis not present

## 2022-12-16 DIAGNOSIS — T8332XS Displacement of intrauterine contraceptive device, sequela: Secondary | ICD-10-CM | POA: Diagnosis not present

## 2022-12-29 DIAGNOSIS — H524 Presbyopia: Secondary | ICD-10-CM | POA: Diagnosis not present

## 2022-12-30 DIAGNOSIS — R21 Rash and other nonspecific skin eruption: Secondary | ICD-10-CM | POA: Diagnosis not present

## 2022-12-30 DIAGNOSIS — Z299 Encounter for prophylactic measures, unspecified: Secondary | ICD-10-CM | POA: Diagnosis not present

## 2022-12-30 DIAGNOSIS — B029 Zoster without complications: Secondary | ICD-10-CM | POA: Diagnosis not present

## 2023-01-21 DIAGNOSIS — Z299 Encounter for prophylactic measures, unspecified: Secondary | ICD-10-CM | POA: Diagnosis not present

## 2023-01-21 DIAGNOSIS — R5383 Other fatigue: Secondary | ICD-10-CM | POA: Diagnosis not present

## 2023-01-21 DIAGNOSIS — E1165 Type 2 diabetes mellitus with hyperglycemia: Secondary | ICD-10-CM | POA: Diagnosis not present

## 2023-01-21 DIAGNOSIS — F319 Bipolar disorder, unspecified: Secondary | ICD-10-CM | POA: Diagnosis not present

## 2023-01-28 DIAGNOSIS — L821 Other seborrheic keratosis: Secondary | ICD-10-CM | POA: Diagnosis not present

## 2023-01-28 DIAGNOSIS — L814 Other melanin hyperpigmentation: Secondary | ICD-10-CM | POA: Diagnosis not present

## 2023-01-28 DIAGNOSIS — D2221 Melanocytic nevi of right ear and external auricular canal: Secondary | ICD-10-CM | POA: Diagnosis not present

## 2023-01-28 DIAGNOSIS — D485 Neoplasm of uncertain behavior of skin: Secondary | ICD-10-CM | POA: Diagnosis not present

## 2023-01-28 DIAGNOSIS — L813 Cafe au lait spots: Secondary | ICD-10-CM | POA: Diagnosis not present

## 2023-01-28 DIAGNOSIS — Z1283 Encounter for screening for malignant neoplasm of skin: Secondary | ICD-10-CM | POA: Diagnosis not present

## 2023-01-28 DIAGNOSIS — D225 Melanocytic nevi of trunk: Secondary | ICD-10-CM | POA: Diagnosis not present

## 2023-01-28 DIAGNOSIS — L718 Other rosacea: Secondary | ICD-10-CM | POA: Diagnosis not present

## 2023-01-28 DIAGNOSIS — S8012XA Contusion of left lower leg, initial encounter: Secondary | ICD-10-CM | POA: Diagnosis not present

## 2023-01-28 DIAGNOSIS — D235 Other benign neoplasm of skin of trunk: Secondary | ICD-10-CM | POA: Diagnosis not present

## 2023-01-30 ENCOUNTER — Other Ambulatory Visit: Payer: Self-pay | Admitting: Pulmonary Disease

## 2023-01-30 DIAGNOSIS — J4550 Severe persistent asthma, uncomplicated: Secondary | ICD-10-CM

## 2023-01-30 NOTE — Telephone Encounter (Signed)
Patient needs a prescription refill for Nucala.

## 2023-02-02 ENCOUNTER — Other Ambulatory Visit (HOSPITAL_BASED_OUTPATIENT_CLINIC_OR_DEPARTMENT_OTHER): Payer: Self-pay

## 2023-02-02 DIAGNOSIS — J4550 Severe persistent asthma, uncomplicated: Secondary | ICD-10-CM

## 2023-02-04 DIAGNOSIS — N6002 Solitary cyst of left breast: Secondary | ICD-10-CM | POA: Diagnosis not present

## 2023-02-04 DIAGNOSIS — R928 Other abnormal and inconclusive findings on diagnostic imaging of breast: Secondary | ICD-10-CM | POA: Diagnosis not present

## 2023-02-04 DIAGNOSIS — N6320 Unspecified lump in the left breast, unspecified quadrant: Secondary | ICD-10-CM | POA: Diagnosis not present

## 2023-02-04 DIAGNOSIS — R92323 Mammographic fibroglandular density, bilateral breasts: Secondary | ICD-10-CM | POA: Diagnosis not present

## 2023-02-05 MED ORDER — NUCALA 100 MG/ML ~~LOC~~ SOAJ: 100.0000 mg | SUBCUTANEOUS | 5 refills | Status: AC

## 2023-02-05 NOTE — Telephone Encounter (Signed)
Refill for Nucala sent to pharmacy. Has follow-up scheduled on 03/09/2023 with Dr. Craige Cotta  Rx faxed.  FAX: [phone] PHONE: [phone]  Chesley Mires, PharmD, MPH, BCPS, CPP Clinical Pharmacist (Rheumatology and Pulmonology)

## 2023-02-13 DIAGNOSIS — M797 Fibromyalgia: Secondary | ICD-10-CM | POA: Diagnosis not present

## 2023-02-13 DIAGNOSIS — R5383 Other fatigue: Secondary | ICD-10-CM | POA: Diagnosis not present

## 2023-02-13 DIAGNOSIS — Z299 Encounter for prophylactic measures, unspecified: Secondary | ICD-10-CM | POA: Diagnosis not present

## 2023-02-13 DIAGNOSIS — Z6830 Body mass index (BMI) 30.0-30.9, adult: Secondary | ICD-10-CM | POA: Diagnosis not present

## 2023-02-13 DIAGNOSIS — G4700 Insomnia, unspecified: Secondary | ICD-10-CM | POA: Diagnosis not present

## 2023-03-09 ENCOUNTER — Encounter: Payer: Self-pay | Admitting: Pulmonary Disease

## 2023-03-09 ENCOUNTER — Ambulatory Visit: Payer: PPO | Admitting: Pulmonary Disease

## 2023-03-09 VITALS — BP 116/78 | HR 80 | Ht 64.5 in | Wt 171.0 lb

## 2023-03-09 DIAGNOSIS — G4733 Obstructive sleep apnea (adult) (pediatric): Secondary | ICD-10-CM

## 2023-03-09 DIAGNOSIS — R911 Solitary pulmonary nodule: Secondary | ICD-10-CM

## 2023-03-09 DIAGNOSIS — J4550 Severe persistent asthma, uncomplicated: Secondary | ICD-10-CM | POA: Diagnosis not present

## 2023-03-09 NOTE — Progress Notes (Signed)
[facility]  Chief Complaint  Patient presents with   Follow-up    6 month follow up     Past Surgical History:  She  has a past surgical history that includes Laparoscopic cholecystectomy; MICRODISSECTION L5-S1 (08/13/2000); Appendectomy; Tonsillectomy; back fusion; Ankle fracture surgery (2006); Esophageal dilation (08/06/2004); Cardiac catheterization (11/27/2006); Bronchoscopy; Laser ablation of the cervix; Back surgery; neck fusion; Colonoscopy with propofol (N/A, 12/12/2020); Cholecystectomy; and Knee arthroscopy with medial menisectomy (Left, 08/09/2021).  Past Medical History:  Serotonin Syndrome, PNA 1986, Nephrolithiasis, Migraine HA, Memory loss, GERD with HH, Fibromyalgia, HLD, DJD, Bipolar, Tremor, HTN  Constitutional:  BP 116/78   Pulse 80   Ht 5' 4.5" (1.638 m)   Wt 171 lb (77.6 kg)   LMP 11/28/2013 Comment: IUD  SpO2 95%   BMI 28.90 kg/m   Brief Summary:  AISSATA [name] is a 57 y.o. female former smoker with eosinophilic asthma and lung nodule.       Subjective:   Breathing has been good.  Not having sinus congestion, cough, wheeze, or shortness of breath.  Uses albuterol intermittently.  Hasn't needed prednisone for a while.    She feels tired and fatigued during the day.  She snores at night.  She had a sleep study in April 2024 that showed mild sleep apnea.  Physical Exam:   Appearance - well kempt   ENMT - no sinus tenderness, no oral exudate, no LAN, Mallampati 3 airway, no stridor  Respiratory - equal breath sounds bilaterally, no wheezing or rales  CV - s1s2 regular rate and rhythm, no murmurs  Ext - no clubbing, no edema  Skin - no rashes  Psych - normal mood and affect    Pulmonary testing:  Spirometry 03/05/10>>FEV1 2.43(87%), FEV1% 93 IgE 2008>>412 IgE 04/01/11>>162.1 Xolair from 11/23/07 to 03/05/10>>stopped due to insurance issues RAST 04/01/11>>Cat dander, dog dander, dust mites PFT  05/28/11>>FEV1 3.01(112%), FEV1% 78, TLC 5.64(112%), RV 2.28(135%), DLCO 99%, positive bronchodilator response. December 2012 >> resumed xolair >> stopped Summer 2014 due to finances >> resumed September 2014 January 2016 >> xolair d/c'ed due to finances FeNO 08/19/16 >> 10 Spirometry 08/19/16 >> FEV1 2.49 (84%), FEV1% 79 Labs 08/19/16 >> IgE 120 PFT 07/01/21 >> FEV1 2.40 (84%), FEV1% 80, TLC 7.88 (151%), DLCO 107%, +BD  Chest Imaging:  CT chest 06/27/21 >> LLL 5 mm nodule, scattered ATX and scarring CT chest 06/17/22 >> coronary calcification, mild apical thickening, scar RLL, no change in LLL 5 mm nodule  Sleep Testing:  PSG 09/30/22 >> AHI 8.1, SpO2 low 84%   Social History:  She  reports that she quit smoking about 44 years ago. Her smoking use included cigarettes. She started smoking about 52 years ago. She has a 4 pack-year smoking history. She has never used smokeless tobacco. She reports that she does not drink alcohol and does not use drugs.  Family History:  Her family history includes Allergies in her brother, father, and mother; Chorea in her maternal grandmother; Clotting disorder in her mother; Diabetes in her father and mother; Heart attack in her father; Heart disease in her father.     Assessment/Plan:   Severe persistent asthma with eosinophilic phenotype. - she has done well with nucala with reduced frequency of exacerbations; she is to continue nucala - continue breztri, singulair - prn albuterol - she has a nebulizer machine - flu shot today  Allergic rhinitis. - continue zyrtec, singulair  Lung nodule with history of tobacco  abuse. - follow up CT chest without contrast in January 2025  Obstructive sleep apnea - reviewed her sleep study results - discussed how sleep apnea can impact her health - she has history of hypertension and mood disorder - she is reluctant to try CPAP - advised her to d/w her dentist about whether she would be a candidate for an oral  appliance and call for a referral if needed  Time Spent Involved in Patient Care on Day of Examination:  28 minutes  Follow up:   Patient Instructions  Flu shot today  Talk to your dentist about whether you would be a candidate for an oral appliance to treat obstructive sleep apnea  Follow up in 4 months  Medication List:   Allergies as of 03/09/2023       Reactions   Cymbalta [duloxetine Hcl] Other (See Comments)   serotonin syndrome   Divalproex Sodium Other (See Comments)   unknown   Effexor [venlafaxine Hydrochloride] Other (See Comments)   serotonin syndrome   Geodon [ziprasidone Hydrochloride] Other (See Comments)   Hands and feet tingling.   Prozac [fluoxetine Hcl] Other (See Comments)   suicidal   Thorazine [chlorpromazine Hcl] Other (See Comments)   unknown   Cephalexin Rash   Joint swelling   Cephalosporins Rash   Joint swelling  Keflex   Codeine Nausea Only   Pt states must take phenergan with codeine.      Morphine Nausea Only      Theo-dur [theophylline] Palpitations   Ziprasidone Nausea Only, Other (See Comments)   Hands and feet tingling.        Medication List        Accurate as of March 09, 2023  9:00 AM. If you have any questions, ask your nurse or doctor.          STOP taking these medications    buPROPion 150 MG 24 hr tablet Commonly known as: WELLBUTRIN XL Stopped by: Coralyn Helling   buPROPion 300 MG 24 hr tablet Commonly known as: WELLBUTRIN XL Stopped by: Coralyn Helling   predniSONE 10 MG tablet Commonly known as: DELTASONE Stopped by: Coralyn Helling       TAKE these medications    albuterol (2.5 MG/3ML) 0.083% nebulizer solution Commonly known as: PROVENTIL Take 2.5 mg by nebulization every 6 (six) hours as needed for shortness of breath or wheezing.   atorvastatin 10 MG tablet Commonly known as: LIPITOR Take 10 mg by mouth daily.   BLACK ELDERBERRY PO Take 1 each by mouth daily. Elderberry gummy   Breztri  Aerosphere 160-9-4.8 MCG/ACT Aero Generic drug: Budeson-Glycopyrrol-Formoterol INHALE 2 PUFFS INTO THE LUNGS IN THE MORNING AND AT BEDTIME.   carbamazepine 100 MG chewable tablet Commonly known as: TEGRETOL Chew 400 mg by mouth at bedtime.   cetirizine 10 MG tablet Commonly known as: ZYRTEC Take 10 mg by mouth daily.   cyclobenzaprine 10 MG tablet Commonly known as: FLEXERIL Take 10 mg by mouth at bedtime.   docusate sodium 50 MG capsule Commonly known as: COLACE Take 150 mg by mouth 2 (two) times daily.   EPINEPHrine 0.3 mg/0.3 mL Soaj injection Commonly known as: EPI-PEN Inject 0.3 mg into the muscle as needed for anaphylaxis.   estradiol 0.1 MG/GM vaginal cream Commonly known as: ESTRACE Place vaginally.   gabapentin 300 MG capsule Commonly known as: NEURONTIN PLEASE SEE ATTACHED FOR DETAILED DIRECTIONS   lamoTRIgine 150 MG tablet Commonly known as: LAMICTAL Take 300 mg by mouth at bedtime.  lisinopril 5 MG tablet Commonly known as: ZESTRIL Take 1 tablet by mouth daily.   meloxicam 15 MG tablet Commonly known as: MOBIC Take 15 mg by mouth daily.   methocarbamol 500 MG tablet Commonly known as: ROBAXIN Take 500 mg by mouth 3 (three) times daily.   montelukast 10 MG tablet Commonly known as: SINGULAIR Take 10 mg by mouth at bedtime.   Nucala 100 MG/ML Soaj Generic drug: Mepolizumab Inject 1 mL (100 mg total) into the skin every 28 (twenty-eight) days.   pantoprazole 40 MG tablet Commonly known as: PROTONIX Take 40 mg by mouth daily.   primidone 50 MG tablet Commonly known as: MYSOLINE Take 1 tablet (50 mg total) by mouth 3 (three) times daily.   promethazine 12.5 MG tablet Commonly known as: PHENERGAN Take 1 tablet (12.5 mg total) by mouth every 6 (six) hours as needed for nausea or vomiting.   rizatriptan 10 MG disintegrating tablet Commonly known as: MAXALT-MLT Take 1 tablet (10 mg total) by mouth as needed for migraine. May repeat in 2 hours  if needed   tiZANidine 4 MG tablet Commonly known as: Zanaflex Take 1 tablet (4 mg total) by mouth every 8 (eight) hours as needed for muscle spasms. Do not take with alcohol or while driving or operating heavy machinery.  May cause drowsiness.   topiramate 50 MG tablet Commonly known as: TOPAMAX Take 1 tablet (50 mg total) by mouth 2 (two) times daily.   Vitamin B-12 2500 MCG Subl Place 2,500 mcg under the tongue daily.   vitamin C 1000 MG tablet Take 1,000 mg by mouth daily.   Vitamin D3 50 MCG (2000 UT) capsule Take 2,000 Units by mouth daily.        Signature:  Coralyn Helling, MD [facility] Pager - [phone] 03/09/2023, 9:00 AM

## 2023-03-09 NOTE — Patient Instructions (Signed)
Flu shot today  Talk to your dentist about whether you would be a candidate for an oral appliance to treat obstructive sleep apnea  Follow up in 4 months

## 2023-04-13 DIAGNOSIS — J069 Acute upper respiratory infection, unspecified: Secondary | ICD-10-CM | POA: Diagnosis not present

## 2023-04-13 DIAGNOSIS — Z299 Encounter for prophylactic measures, unspecified: Secondary | ICD-10-CM | POA: Diagnosis not present

## 2023-04-13 DIAGNOSIS — R5383 Other fatigue: Secondary | ICD-10-CM | POA: Diagnosis not present

## 2023-05-01 DIAGNOSIS — Z299 Encounter for prophylactic measures, unspecified: Secondary | ICD-10-CM | POA: Diagnosis not present

## 2023-05-01 DIAGNOSIS — R5383 Other fatigue: Secondary | ICD-10-CM | POA: Diagnosis not present

## 2023-05-01 DIAGNOSIS — M797 Fibromyalgia: Secondary | ICD-10-CM | POA: Diagnosis not present

## 2023-05-01 DIAGNOSIS — F319 Bipolar disorder, unspecified: Secondary | ICD-10-CM | POA: Diagnosis not present

## 2023-05-01 DIAGNOSIS — G4700 Insomnia, unspecified: Secondary | ICD-10-CM | POA: Diagnosis not present

## 2023-05-06 DIAGNOSIS — J45909 Unspecified asthma, uncomplicated: Secondary | ICD-10-CM | POA: Diagnosis not present

## 2023-05-06 DIAGNOSIS — Z299 Encounter for prophylactic measures, unspecified: Secondary | ICD-10-CM | POA: Diagnosis not present

## 2023-05-06 DIAGNOSIS — R059 Cough, unspecified: Secondary | ICD-10-CM | POA: Diagnosis not present

## 2023-05-06 DIAGNOSIS — R053 Chronic cough: Secondary | ICD-10-CM | POA: Diagnosis not present

## 2023-05-13 DIAGNOSIS — Z299 Encounter for prophylactic measures, unspecified: Secondary | ICD-10-CM | POA: Diagnosis not present

## 2023-05-13 DIAGNOSIS — R5383 Other fatigue: Secondary | ICD-10-CM | POA: Diagnosis not present

## 2023-05-13 DIAGNOSIS — J45909 Unspecified asthma, uncomplicated: Secondary | ICD-10-CM | POA: Diagnosis not present

## 2023-05-15 ENCOUNTER — Telehealth: Payer: Self-pay | Admitting: Family Medicine

## 2023-05-15 NOTE — Telephone Encounter (Signed)
Called to schedule follow up with Dr. Wynell Balloon states she is going to follow Dr. Craige Cotta.

## 2023-06-01 DIAGNOSIS — J45909 Unspecified asthma, uncomplicated: Secondary | ICD-10-CM | POA: Diagnosis not present

## 2023-06-01 DIAGNOSIS — Z299 Encounter for prophylactic measures, unspecified: Secondary | ICD-10-CM | POA: Diagnosis not present

## 2023-06-01 DIAGNOSIS — R5383 Other fatigue: Secondary | ICD-10-CM | POA: Diagnosis not present

## 2023-06-09 DIAGNOSIS — F439 Reaction to severe stress, unspecified: Secondary | ICD-10-CM | POA: Diagnosis not present

## 2023-06-09 DIAGNOSIS — K219 Gastro-esophageal reflux disease without esophagitis: Secondary | ICD-10-CM | POA: Diagnosis not present

## 2023-06-09 DIAGNOSIS — R0789 Other chest pain: Secondary | ICD-10-CM | POA: Diagnosis not present

## 2023-06-09 DIAGNOSIS — Z299 Encounter for prophylactic measures, unspecified: Secondary | ICD-10-CM | POA: Diagnosis not present

## 2023-06-10 ENCOUNTER — Ambulatory Visit
Admission: EM | Admit: 2023-06-10 | Discharge: 2023-06-10 | Disposition: A | Discharge status: Home / Self Care | Payer: PPO

## 2023-06-10 DIAGNOSIS — J4551 Severe persistent asthma with (acute) exacerbation: Secondary | ICD-10-CM

## 2023-06-10 DIAGNOSIS — K219 Gastro-esophageal reflux disease without esophagitis: Secondary | ICD-10-CM

## 2023-06-10 MED ORDER — DEXAMETHASONE SODIUM PHOSPHATE 10 MG/ML IJ SOLN
10.0000 mg | Freq: Once | INTRAMUSCULAR | Status: AC
  Administered 2023-06-10: 10 mg via INTRAMUSCULAR

## 2023-06-10 MED ORDER — BENZONATATE 100 MG PO CAPS: 100.0000 mg | ORAL_CAPSULE | Freq: Three times a day (TID) | ORAL | 0 refills | Status: DC

## 2023-06-10 NOTE — ED Triage Notes (Signed)
 Pt reports cough, starting on Tuesday she recalls having a bad case of Acid reflux. And thinks it may have gotten into her lungs. Was  seen by Primary care provider and was given an increased dose of Protonix, has a hx of bronchitis.

## 2023-06-10 NOTE — ED Provider Notes (Signed)
 [facility]    CSN: [id number] Arrival date & time: 06/10/23  1059      History   Chief Complaint No chief complaint on file.   HPI Nancy [name] is a 58 y.o. female.   Patient presenting today with 2-day history of hacking cough, chest tightness, wheezing.  States the symptoms are following a severe case of acid reflux 2 days ago where she thinks she may have aspirated some stomach acid.  She was seen by PCP yesterday and reflux regimen was increased which she states is helping those symptoms but the cough persists.  History of severe asthma on inhaler regimen.  States the inhalers are helping somewhat.    Past Medical History:  Diagnosis Date   Allergic rhinitis    Asthma    Benign familial tremor    Bipolar 1 disorder (HCC)    Chronic neck and back pain    Degenerative disk disease    DJD (degenerative joint disease), lumbar    Dyslipidemia    Fibromyalgia    GERD (gastroesophageal reflux disease)    Hiatal hernia    History of kidney stones    Irritable bowel syndrome (IBS)    Memory disorder 07/18/2014   Memory loss    Migraine headache    Nephrolithiasis    Pneumonia 1986   Serotonin syndrome    Tremor    hands,arms    Patient Active Problem List   Diagnosis Date Noted   Headache, migraine 10/31/2021   Acute medial meniscus tear of left knee    GERD (gastroesophageal reflux disease) 06/13/2021   Allergic rhinitis 12/30/2019   Degenerative lumbar disc 11/23/2018   Preoperative clearance 11/09/2018   Memory disorder 07/18/2014   Severe persistent asthma with acute bronchitis and acute exacerbation 03/04/2013   Exercise-induced asthma 07/30/2012   Severe persistent asthma in adult without complication 03/12/2011    Past Surgical History:  Procedure Laterality Date   ANKLE FRACTURE SURGERY  2006   Right   APPENDECTOMY     back fusion     BACK SURGERY     BRONCHOSCOPY     S. aureus from BAL   CARDIAC CATHETERIZATION  11/27/2006    minimal CAD   CHOLECYSTECTOMY     COLONOSCOPY WITH PROPOFOL  N/A 12/12/2020   Procedure: COLONOSCOPY WITH PROPOFOL ;  Surgeon: Golda Claudis PENNER, MD;  Location: [facility];  Service: Endoscopy;  Laterality: N/A;  820   ESOPHAGEAL DILATION  08/06/2004   KNEE ARTHROSCOPY WITH MEDIAL MENISECTOMY Left 08/09/2021   Procedure: KNEE ARTHROSCOPY WITH PARTIAL MEDIAL MENISCECTOMY;  Surgeon: Margrette Taft BRAVO, MD;  Location: [facility];  Service: Orthopedics;  Laterality: Left;   LAPAROSCOPIC CHOLECYSTECTOMY     LASER ABLATION OF THE CERVIX     MICRODISSECTION L5-S1  08/13/2000   neck fusion     TONSILLECTOMY      OB History   No obstetric history on file.      Home Medications    Prior to Admission medications   Medication Sig Start Date End Date Taking? Authorizing Provider  benzonatate  (TESSALON ) 100 MG capsule Take 1 capsule (100 mg total) by mouth every 8 (eight) hours. 06/10/23  Yes Stuart Vernell Norris, PA-C  famotidine  (PEPCID ) 10 MG tablet Take 10 mg by mouth 2 (two) times daily.   Yes [provider]  albuterol  (PROVENTIL ) (2.5 MG/3ML) 0.083% nebulizer solution Take 2.5 mg by nebulization every 6 (six) hours as needed for shortness of breath or wheezing.  [provider]  Ascorbic Acid (VITAMIN C) 1000 MG tablet Take 1,000 mg by mouth daily.    [provider]  atorvastatin (LIPITOR) 10 MG tablet Take 10 mg by mouth daily.    [provider]  BLACK ELDERBERRY PO Take 1 each by mouth daily. Elderberry gummy    [provider]  BREZTRI  AEROSPHERE 160-9-4.8 MCG/ACT AERO INHALE 2 PUFFS INTO THE LUNGS IN THE MORNING AND AT BEDTIME. 08/28/22   Sood, Vineet, MD  carbamazepine  (TEGRETOL ) 100 MG chewable tablet Chew 400 mg by mouth at bedtime. 11/23/20   [provider]  cetirizine (ZYRTEC) 10 MG tablet Take 10 mg by mouth daily.    [provider]  Cholecalciferol (VITAMIN D3) 50 MCG (2000 UT) capsule Take 2,000 Units by mouth daily.     [provider]  Cyanocobalamin (VITAMIN B-12) 2500 MCG SUBL Place 2,500 mcg under the tongue daily.    [provider]  cyclobenzaprine (FLEXERIL) 10 MG tablet Take 10 mg by mouth at bedtime.    [provider]  docusate sodium (COLACE) 50 MG capsule Take 150 mg by mouth 2 (two) times daily.    [provider]  EPINEPHrine  0.3 mg/0.3 mL IJ SOAJ injection Inject 0.3 mg into the muscle as needed for anaphylaxis.    [provider]  estradiol (ESTRACE) 0.1 MG/GM vaginal cream Place vaginally. 11/12/22   [provider]  gabapentin (NEURONTIN) 300 MG capsule PLEASE SEE ATTACHED FOR DETAILED DIRECTIONS    [provider]  lamoTRIgine (LAMICTAL) 150 MG tablet Take 300 mg by mouth at bedtime.    [provider]  lisinopril (ZESTRIL) 5 MG tablet Take 1 tablet by mouth daily. 10/01/22   [provider]  meloxicam (MOBIC) 15 MG tablet Take 15 mg by mouth daily. 05/19/20   [provider]  Mepolizumab  (NUCALA ) 100 MG/ML SOAJ Inject 1 mL (100 mg total) into the skin every 28 (twenty-eight) days. 02/05/23   Sood, Vineet, MD  methocarbamol (ROBAXIN) 500 MG tablet Take 500 mg by mouth 3 (three) times daily. 10/19/22   [provider]  montelukast (SINGULAIR) 10 MG tablet Take 10 mg by mouth at bedtime.    [provider]  pantoprazole  (PROTONIX ) 40 MG tablet Take 40 mg by mouth daily.    [provider]  primidone  (MYSOLINE ) 50 MG tablet Take 1 tablet (50 mg total) by mouth 3 (three) times daily. 08/07/22   Lomax, Amy, NP  promethazine  (PHENERGAN ) 12.5 MG tablet Take 1 tablet (12.5 mg total) by mouth every 6 (six) hours as needed for nausea or vomiting. 08/09/21   Margrette Taft BRAVO, MD  rizatriptan  (MAXALT -MLT) 10 MG disintegrating tablet Take 1 tablet (10 mg total) by mouth as needed for migraine. May repeat in 2 hours if needed 01/14/22   Penumalli, Vikram R, MD  tiZANidine  (ZANAFLEX ) 4 MG tablet Take  1 tablet (4 mg total) by mouth every 8 (eight) hours as needed for muscle spasms. Do not take with alcohol  or while driving or operating heavy machinery.  May cause drowsiness. 11/03/22   Chandra Harlene LABOR, NP  topiramate  (TOPAMAX ) 50 MG tablet Take 1 tablet (50 mg total) by mouth 2 (two) times daily. 07/21/22   Lomax, Amy, NP    Family History Family History  Problem Relation Age of Onset   Allergies Mother    Clotting disorder Mother    Diabetes Mother    Allergies Father    Heart disease Father  Heart attack Father    Diabetes Father    Allergies Brother    Chorea Maternal Grandmother     Social History Social History   Tobacco Use   Smoking status: Former    Current packs/day: 0.00    Average packs/day: 0.5 packs/day for 8.0 years (4.0 ttl pk-yrs)    Types: Cigarettes    Start date: 06/09/1970    Quit date: 06/09/1978    Years since quitting: 45.0   Smokeless tobacco: Never  Vaping Use   Vaping status: Never Used  Substance Use Topics   Alcohol  use: No   Drug use: No     Allergies   Cymbalta [duloxetine hcl], Divalproex sodium, Effexor [venlafaxine hydrochloride], Geodon [ziprasidone hydrochloride], Prozac [fluoxetine hcl], Thorazine [chlorpromazine hcl], Cephalexin, Cephalosporins, Codeine, Morphine , Theo-dur [theophylline], and Ziprasidone   Review of Systems Review of Systems Per HPI  Physical Exam Triage Vital Signs ED Triage Vitals  Encounter Vitals Group     BP 06/10/23 1209 133/76     Systolic BP Percentile --      Diastolic BP Percentile --      Pulse Rate 06/10/23 1209 100     Resp 06/10/23 1209 17     Temp 06/10/23 1209 97.8 F (36.6 C)     Temp Source 06/10/23 1209 Oral     SpO2 06/10/23 1209 94 %     Weight --      Height --      Head Circumference --      Peak Flow --      Pain Score 06/10/23 1212 0     Pain Loc --      Pain Education --      Exclude from Growth Chart --    No data found.  Updated Vital Signs BP 133/76 (BP  Location: Right Arm)   Pulse 100   Temp 97.8 F (36.6 C) (Oral)   Resp 17   LMP 11/28/2013 Comment: IUD  SpO2 94%   Visual Acuity Right Eye Distance:   Left Eye Distance:   Bilateral Distance:    Right Eye Near:   Left Eye Near:    Bilateral Near:     Physical Exam Vitals and nursing note reviewed.  Constitutional:      Appearance: Normal appearance.  HENT:     Head: Atraumatic.     Right Ear: Tympanic membrane and external ear normal.     Left Ear: Tympanic membrane and external ear normal.     Nose: Nose normal.     Mouth/Throat:     Mouth: Mucous membranes are moist.     Pharynx: Posterior oropharyngeal erythema present.  Eyes:     Extraocular Movements: Extraocular movements intact.     Conjunctiva/sclera: Conjunctivae normal.  Cardiovascular:     Rate and Rhythm: Normal rate and regular rhythm.     Heart sounds: Normal heart sounds.  Pulmonary:     Effort: Pulmonary effort is normal.     Breath sounds: Normal breath sounds. No wheezing or rales.  Musculoskeletal:        General: Normal range of motion.     Cervical back: Normal range of motion and neck supple.  Skin:    General: Skin is warm and dry.  Neurological:     Mental Status: She is alert and oriented to person, place, and time.  Psychiatric:        Mood and Affect: Mood normal.        Thought Content: Thought  content normal.      UC Treatments / Results  Labs (all labs ordered are listed, but only abnormal results are displayed) Labs Reviewed - No data to display  EKG   Radiology No results found.  Procedures Procedures (including critical care time)  Medications Ordered in UC Medications  dexamethasone  (DECADRON ) injection 10 mg (10 mg Intramuscular Given 06/10/23 1256)    Initial Impression / Assessment and Plan / UC Course  I have reviewed the triage vital signs and the nursing notes.  Pertinent labs & imaging results that were available during my care of the patient were  reviewed by me and considered in my medical decision making (see chart for details).     Suspect asthma exacerbation secondary to reflux exacerbation.  Continue reflux regimen increase per PCP and will give IM Decadron , Tessalon  Perles in addition to inhaler regimen for cough.  Discussed supportive over-the-counter medications and home care additionally.  Turn for worsening symptoms.  Final Clinical Impressions(s) / UC Diagnoses   Final diagnoses:  Gastroesophageal reflux disease, unspecified whether esophagitis present  Severe persistent asthma with acute exacerbation     Discharge Instructions      Continue on the increased reflux regimen as recommended yesterday by provider.  We have given you a steroid shot today to help with suspected asthma exacerbation secondary to this in addition to your inhaler regimen, supportive over-the-counter medications for cough and congestion.  Follow-up for significantly worsening symptoms at any time.    ED Prescriptions     Medication Sig Dispense Auth. Provider   benzonatate  (TESSALON ) 100 MG capsule Take 1 capsule (100 mg total) by mouth every 8 (eight) hours. 21 capsule Stuart Vernell Norris, [place]      PDMP not reviewed this encounter.   Stuart Vernell Norris, [place] 06/10/23 1303

## 2023-06-10 NOTE — Discharge Instructions (Signed)
 Continue on the increased reflux regimen as recommended yesterday by provider.  We have given you a steroid shot today to help with suspected asthma exacerbation secondary to this in addition to your inhaler regimen, supportive over-the-counter medications for cough and congestion.  Follow-up for significantly worsening symptoms at any time.

## 2023-06-11 DIAGNOSIS — K219 Gastro-esophageal reflux disease without esophagitis: Secondary | ICD-10-CM | POA: Diagnosis not present

## 2023-06-11 DIAGNOSIS — I739 Peripheral vascular disease, unspecified: Secondary | ICD-10-CM | POA: Diagnosis not present

## 2023-06-11 DIAGNOSIS — J40 Bronchitis, not specified as acute or chronic: Secondary | ICD-10-CM | POA: Diagnosis not present

## 2023-06-11 DIAGNOSIS — F319 Bipolar disorder, unspecified: Secondary | ICD-10-CM | POA: Diagnosis not present

## 2023-06-12 DIAGNOSIS — J301 Allergic rhinitis due to pollen: Secondary | ICD-10-CM | POA: Diagnosis not present

## 2023-06-12 DIAGNOSIS — J4550 Severe persistent asthma, uncomplicated: Secondary | ICD-10-CM | POA: Diagnosis not present

## 2023-06-12 DIAGNOSIS — G4733 Obstructive sleep apnea (adult) (pediatric): Secondary | ICD-10-CM | POA: Diagnosis not present

## 2023-06-12 DIAGNOSIS — R911 Solitary pulmonary nodule: Secondary | ICD-10-CM | POA: Diagnosis not present

## 2023-06-22 ENCOUNTER — Other Ambulatory Visit: Payer: Self-pay

## 2023-06-22 ENCOUNTER — Emergency Department (HOSPITAL_COMMUNITY)
Admission: EM | Admit: 2023-06-22 | Discharge: 2023-06-23 | Disposition: A | Discharge status: Home / Self Care | Payer: PPO | Attending: Emergency Medicine | Admitting: Emergency Medicine

## 2023-06-22 ENCOUNTER — Encounter (HOSPITAL_COMMUNITY): Payer: Self-pay | Admitting: Emergency Medicine

## 2023-06-22 ENCOUNTER — Emergency Department (HOSPITAL_COMMUNITY): Payer: PPO

## 2023-06-22 DIAGNOSIS — R079 Chest pain, unspecified: Secondary | ICD-10-CM

## 2023-06-22 DIAGNOSIS — R0789 Other chest pain: Secondary | ICD-10-CM | POA: Insufficient documentation

## 2023-06-22 LAB — COMPREHENSIVE METABOLIC PANEL
ALT: 30 U/L (ref 0–44)
AST: 23 U/L (ref 15–41)
Albumin: 4.3 g/dL (ref 3.5–5.0)
Alkaline Phosphatase: 109 U/L (ref 38–126)
Anion gap: 12 (ref 5–15)
BUN: 16 mg/dL (ref 6–20)
CO2: 23 mmol/L (ref 22–32)
Calcium: 9.5 mg/dL (ref 8.9–10.3)
Chloride: 105 mmol/L (ref 98–111)
Creatinine, Ser: 0.69 mg/dL (ref 0.44–1.00)
GFR, Estimated: >60 mL/min (ref >60)
Glucose, Bld: 115 mg/dL — ABNORMAL HIGH (ref 70–99)
Potassium: 3.3 mmol/L — ABNORMAL LOW (ref 3.5–5.1)
Sodium: 140 mmol/L (ref 135–145)
Total Bilirubin: 0.6 mg/dL (ref 0.0–1.2)
Total Protein: 7.7 g/dL (ref 6.5–8.1)

## 2023-06-22 LAB — CBC WITH DIFFERENTIAL/PLATELET
Abs Immature Granulocytes: 0.03 10*3/uL (ref 0.00–0.07)
Basophils Absolute: 0 10*3/uL (ref 0.0–0.1)
Basophils Relative: 0 %
Eosinophils Absolute: 0.1 10*3/uL (ref 0.0–0.5)
Eosinophils Relative: 1 %
HCT: 46.7 % — ABNORMAL HIGH (ref 36.0–46.0)
Hemoglobin: 15.1 g/dL — ABNORMAL HIGH (ref 12.0–15.0)
Immature Granulocytes: 0 %
Lymphocytes Relative: 18 %
Lymphs Abs: 1.6 10*3/uL (ref 0.7–4.0)
MCH: 30.7 pg (ref 26.0–34.0)
MCHC: 32.3 g/dL (ref 30.0–36.0)
MCV: 94.9 fL (ref 80.0–100.0)
Monocytes Absolute: 0.6 10*3/uL (ref 0.1–1.0)
Monocytes Relative: 7 %
Neutro Abs: 6.2 10*3/uL (ref 1.7–7.7)
Neutrophils Relative %: 74 %
Platelets: 300 10*3/uL (ref 150–400)
RBC: 4.92 MIL/uL (ref 3.87–5.11)
RDW: 13.3 % (ref 11.5–15.5)
WBC: 8.5 10*3/uL (ref 4.0–10.5)
nRBC: 0 % (ref 0.0–0.2)

## 2023-06-22 LAB — TROPONIN I (HIGH SENSITIVITY): Troponin I (High Sensitivity): <2 ng/L (ref <18)

## 2023-06-22 LAB — LIPASE, BLOOD: Lipase: 32 U/L (ref 11–51)

## 2023-06-22 MED ORDER — SUCRALFATE 1 GM/10ML PO SUSP
1.0000 g | Freq: Once | ORAL | Status: AC
  Administered 2023-06-22: 1 g via ORAL
  Filled 2023-06-22: qty 10

## 2023-06-22 MED ORDER — HYOSCYAMINE SULFATE 0.125 MG SL SUBL
0.2500 mg | SUBLINGUAL_TABLET | Freq: Once | SUBLINGUAL | Status: AC
  Administered 2023-06-22: 0.25 mg via SUBLINGUAL
  Filled 2023-06-22: qty 2

## 2023-06-22 MED ORDER — ALUM & MAG HYDROXIDE-SIMETH 200-200-20 MG/5ML PO SUSP
30.0000 mL | Freq: Once | ORAL | Status: AC
  Administered 2023-06-22: 30 mL via ORAL
  Filled 2023-06-22: qty 30

## 2023-06-22 MED ORDER — OXYCODONE-ACETAMINOPHEN 5-325 MG PO TABS
1.0000 | ORAL_TABLET | Freq: Once | ORAL | Status: AC
  Administered 2023-06-22: 1 via ORAL
  Filled 2023-06-22: qty 1

## 2023-06-22 MED ORDER — LIDOCAINE VISCOUS HCL 2 % MT SOLN
15.0000 mL | Freq: Once | OROMUCOSAL | Status: AC
  Administered 2023-06-22: 15 mL via OROMUCOSAL
  Filled 2023-06-22: qty 15

## 2023-06-22 NOTE — ED Provider Notes (Signed)
 [place] EMERGENCY DEPARTMENT AT [facility] Provider Note   CSN: [id number] Arrival date & time: 06/22/23  1539     History  Chief Complaint  Patient presents with   Heartburn    Nancy [name] is a 58 y.o. female.  Patient to ED with progressively worsening sharp central chest pain being treated as reflux. Has been on omeprazole regularly with a recent prescription for sucralfate  which she took as prescribed but that offered no relief. She is now having a sporadic cough and reports history of bronchitis secondary to uncontrolled reflux in the past. No fever, vomiting. Pain does not radiate to the back.   The history is provided by the patient. No language interpreter was used.  Heartburn       Home Medications Prior to Admission medications   Medication Sig Start Date End Date Taking? Authorizing Provider  oxyCODONE -acetaminophen  (PERCOCET/ROXICET) 5-325 MG tablet Take 1 tablet by mouth every 6 (six) hours as needed for severe pain (pain score 7-10). 06/23/23  Yes Zula Hovsepian, Margit, PA-C  sucralfate  (CARAFATE ) 1 g tablet Take 1 tablet (1 g total) by mouth 4 (four) times daily -  with meals and at bedtime. 06/23/23  Yes Graziella Connery, Margit, PA-C  albuterol  (PROVENTIL ) (2.5 MG/3ML) 0.083% nebulizer solution Take 2.5 mg by nebulization every 6 (six) hours as needed for shortness of breath or wheezing.    [provider]  Ascorbic Acid (VITAMIN C) 1000 MG tablet Take 1,000 mg by mouth daily.    [provider]  atorvastatin (LIPITOR) 10 MG tablet Take 10 mg by mouth daily.    [provider]  benzonatate  (TESSALON ) 100 MG capsule Take 1 capsule (100 mg total) by mouth every 8 (eight) hours. 06/10/23   Stuart Vernell Norris, PA-C  BLACK ELDERBERRY PO Take 1 each by mouth daily. Elderberry gummy    [provider]  BREZTRI  AEROSPHERE 160-9-4.8 MCG/ACT AERO INHALE 2 PUFFS INTO THE LUNGS IN THE MORNING AND AT BEDTIME. 08/28/22   Sood, Vineet, MD   carbamazepine  (TEGRETOL ) 100 MG chewable tablet Chew 400 mg by mouth at bedtime. 11/23/20   [provider]  cetirizine (ZYRTEC) 10 MG tablet Take 10 mg by mouth daily.    [provider]  Cholecalciferol (VITAMIN D3) 50 MCG (2000 UT) capsule Take 2,000 Units by mouth daily.    [provider]  Cyanocobalamin (VITAMIN B-12) 2500 MCG SUBL Place 2,500 mcg under the tongue daily.    [provider]  cyclobenzaprine (FLEXERIL) 10 MG tablet Take 10 mg by mouth at bedtime.    [provider]  docusate sodium (COLACE) 50 MG capsule Take 150 mg by mouth 2 (two) times daily.    [provider]  EPINEPHrine  0.3 mg/0.3 mL IJ SOAJ injection Inject 0.3 mg into the muscle as needed for anaphylaxis.    [provider]  estradiol (ESTRACE) 0.1 MG/GM vaginal cream Place vaginally. 11/12/22   [provider]  famotidine  (PEPCID ) 20 MG tablet Take 0.5 tablets (10 mg total) by mouth 2 (two) times daily. 06/23/23   Odell Margit, PA-C  gabapentin (NEURONTIN) 300 MG capsule PLEASE SEE ATTACHED FOR DETAILED DIRECTIONS    [provider]  lamoTRIgine (LAMICTAL) 150 MG tablet Take 300 mg by mouth at bedtime.    [provider]  lisinopril (ZESTRIL) 5 MG tablet Take 1 tablet by mouth daily. 10/01/22   [provider]  meloxicam (MOBIC) 15 MG tablet Take 15 mg by mouth daily. 05/19/20  [provider]  Mepolizumab  (NUCALA ) 100 MG/ML SOAJ Inject 1 mL (100 mg total) into the skin every 28 (twenty-eight) days. 02/05/23   Sood, Vineet, MD  methocarbamol (ROBAXIN) 500 MG tablet Take 500 mg by mouth 3 (three) times daily. 10/19/22   [provider]  montelukast (SINGULAIR) 10 MG tablet Take 10 mg by mouth at bedtime.    [provider]  pantoprazole  (PROTONIX ) 40 MG tablet Take 1 tablet (40 mg total) by mouth 2 (two) times daily. 06/23/23   Odell Balls, PA-C  primidone  (MYSOLINE ) 50 MG tablet Take 1 tablet  (50 mg total) by mouth 3 (three) times daily. 08/07/22   Lomax, Amy, NP  promethazine  (PHENERGAN ) 12.5 MG tablet Take 1 tablet (12.5 mg total) by mouth every 6 (six) hours as needed for nausea or vomiting. 08/09/21   Margrette Taft BRAVO, MD  rizatriptan  (MAXALT -MLT) 10 MG disintegrating tablet Take 1 tablet (10 mg total) by mouth as needed for migraine. May repeat in 2 hours if needed 01/14/22   Penumalli, Vikram R, MD  tiZANidine  (ZANAFLEX ) 4 MG tablet Take 1 tablet (4 mg total) by mouth every 8 (eight) hours as needed for muscle spasms. Do not take with alcohol  or while driving or operating heavy machinery.  May cause drowsiness. 11/03/22   Chandra Harlene LABOR, NP  topiramate  (TOPAMAX ) 50 MG tablet Take 1 tablet (50 mg total) by mouth 2 (two) times daily. 07/21/22   Lomax, Amy, NP      Allergies    Cymbalta [duloxetine hcl], Divalproex sodium, Effexor [venlafaxine hydrochloride], Geodon [ziprasidone hydrochloride], Prozac [fluoxetine hcl], Thorazine [chlorpromazine hcl], Cephalexin, Cephalosporins, Codeine, Morphine , Theo-dur [theophylline], and Ziprasidone    Review of Systems   Review of Systems  Gastrointestinal:  Positive for heartburn.    Physical Exam Updated Vital Signs BP (!) 126/96 (BP Location: Right Arm)   Pulse 95   Temp 97.9 F (36.6 C) (Oral)   Resp 16   Ht 5' 4.5 (1.638 m)   Wt 77.6 kg   LMP 11/28/2013 Comment: IUD  SpO2 96%   BMI 28.91 kg/m  Physical Exam Vitals and nursing note reviewed.  Constitutional:      Appearance: She is well-developed.  HENT:     Head: Normocephalic.  Cardiovascular:     Rate and Rhythm: Normal rate and regular rhythm.     Heart sounds: No murmur heard. Pulmonary:     Effort: Pulmonary effort is normal.     Breath sounds: Normal breath sounds. No wheezing, rhonchi or rales.  Abdominal:     General: Bowel sounds are normal.     Palpations: Abdomen is soft.     Tenderness: There is no abdominal tenderness. There is no guarding or rebound.   Musculoskeletal:        General: Normal range of motion.     Cervical back: Normal range of motion and neck supple.     Right lower leg: No edema.     Left lower leg: No edema.  Skin:    General: Skin is warm and dry.  Neurological:     General: No focal deficit present.     Mental Status: She is alert and oriented to person, place, and time.     ED Results / Procedures / Treatments   Labs (all labs ordered are listed, but only abnormal results are displayed) Labs Reviewed  CBC WITH DIFFERENTIAL/PLATELET - Abnormal; Notable for the following components:      Result Value   Hemoglobin 15.1 (*)  HCT 46.7 (*)    All other components within normal limits  COMPREHENSIVE METABOLIC PANEL - Abnormal; Notable for the following components:   Potassium 3.3 (*)    Glucose, Bld 115 (*)    All other components within normal limits  LIPASE, BLOOD  TROPONIN I (HIGH SENSITIVITY)  TROPONIN I (HIGH SENSITIVITY)    EKG EKG Interpretation Date/Time:  Monday June 22 2023 16:13:51 EST Ventricular Rate:  123 PR Interval:  142 QRS Duration:  84 QT Interval:  312 QTC Calculation: 446 R Axis:   81  Text Interpretation: Sinus tachycardia Cannot rule out Anterior infarct , age undetermined Abnormal ECG Confirmed by Yolande Charleston [phone]) on 06/22/2023 5:00:35 PM  Radiology DG Chest 2 View Result Date: 06/22/2023 CLINICAL DATA:  Chest pain EXAM: CHEST - 2 VIEW COMPARISON:  05/06/2023 FINDINGS: The heart size and mediastinal contours are within normal limits. Both lungs are clear. The visualized skeletal structures are unremarkable. IMPRESSION: No active cardiopulmonary disease. Electronically Signed   By: Mabel Converse D.O.   On: 06/22/2023 17:34    Procedures Procedures    Medications Ordered in ED Medications  alum & mag hydroxide-simeth (MAALOX/MYLANTA) 200-200-20 MG/5ML suspension 30 mL (30 mLs Oral Given 06/22/23 2048)  hyoscyamine  (LEVSIN  SL) SL tablet 0.25 mg (0.25 mg  Sublingual Given 06/22/23 2047)  sucralfate  (CARAFATE ) 1 GM/10ML suspension 1 g (1 g Oral Given 06/22/23 2142)  oxyCODONE -acetaminophen  (PERCOCET/ROXICET) 5-325 MG per tablet 1 tablet (1 tablet Oral Given 06/22/23 2252)  lidocaine  (XYLOCAINE ) 2 % viscous mouth solution 15 mL (15 mLs Mouth/Throat Given 06/22/23 2223)    ED Course/ Medical Decision Making/ A&P Clinical Course as of 06/23/23 0034  Mon Jun 22, 2023  2228 Being treated for GERD since 12/31 by PCP, symptoms uncontrolled and severe tonight. Labs done without abnormality, including negative troponin. CXR negative. Patient was given GI cocktail with hyoscyamine  without relief. Carafate  was given, also without relief. Viscous Lidocaine  ordered. Will re-examine. Patient seen by Dr. Yolande.  [SU]  2327 Initially got relief with lidocaine  but pain resumed after a brief period. She is now complaining of chest pressure as well. Repeat EKG and troponin ordered. Will provide oxycodone .  [SU]    Clinical Course User Index [SU] Odell Balls, PA-C                                 Medical Decision Making Amount and/or Complexity of Data Reviewed Labs: ordered. Radiology: ordered.  Risk OTC drugs. Prescription drug management.           Final Clinical Impression(s) / ED Diagnoses Final diagnoses:  Chest pain, unspecified type    Rx / DC Orders ED Discharge Orders          Ordered    sucralfate  (CARAFATE ) 1 g tablet  3 times daily with meals & bedtime        06/23/23 0017    pantoprazole  (PROTONIX ) 40 MG tablet  2 times daily        06/23/23 0017    famotidine  (PEPCID ) 20 MG tablet  2 times daily        06/23/23 0017    oxyCODONE -acetaminophen  (PERCOCET/ROXICET) 5-325 MG tablet  Every 6 hours PRN        06/23/23 0021              Odell Balls, PA-C 06/23/23 0035    Yolande Charleston BROCKS, MD 06/24/23 [phone]

## 2023-06-22 NOTE — ED Triage Notes (Signed)
 Pt endorses heartburn since the 31st that worsened today. States she has been on medication. States she feels like "it may have gotten in my lungs."

## 2023-06-22 NOTE — ED Provider Triage Note (Signed)
 Emergency Medicine Provider Triage Evaluation Note  Nancy [name] , a 58 y.o. female  was evaluated in triage.  Pt complains of heartburn.  Review of Systems  Positive: Worse pain,  Negative: Vomiting, change in BM, fever  Physical Exam  LMP 11/28/2013 Comment: IUD Gen:   Awake, no distress   Resp:  Normal effort  MSK:   Moves extremities without difficulty  Other:    Medical Decision Making  Medically screening exam initiated at 4:09 PM.  Appropriate orders placed.  Keauna A Rosol was informed that the remainder of the evaluation will be completed by another provider, this initial triage assessment does not replace that evaluation, and the importance of remaining in the ED until their evaluation is complete.  Heartburn starting on Dec 31st On omeprazole regularly Had Rx sucralfate  that is finished History of a bronchitis from aspiration, now with beginning of cough No fever   Odell Balls, PA-C 06/22/23 1611

## 2023-06-23 DIAGNOSIS — Z299 Encounter for prophylactic measures, unspecified: Secondary | ICD-10-CM | POA: Diagnosis not present

## 2023-06-23 DIAGNOSIS — K219 Gastro-esophageal reflux disease without esophagitis: Secondary | ICD-10-CM | POA: Diagnosis not present

## 2023-06-23 DIAGNOSIS — E119 Type 2 diabetes mellitus without complications: Secondary | ICD-10-CM | POA: Diagnosis not present

## 2023-06-23 LAB — TROPONIN I (HIGH SENSITIVITY): Troponin I (High Sensitivity): <2 ng/L (ref <18)

## 2023-06-23 MED ORDER — OXYCODONE-ACETAMINOPHEN 5-325 MG PO TABS: 1.0000 | ORAL_TABLET | Freq: Four times a day (QID) | ORAL | 0 refills | Status: AC | PRN

## 2023-06-23 MED ORDER — FAMOTIDINE 20 MG PO TABS: 10.0000 mg | ORAL_TABLET | Freq: Two times a day (BID) | ORAL | 0 refills | Status: AC

## 2023-06-23 MED ORDER — SUCRALFATE 1 G PO TABS: 1.0000 g | ORAL_TABLET | Freq: Three times a day (TID) | ORAL | 0 refills | Status: DC

## 2023-06-23 MED ORDER — PANTOPRAZOLE SODIUM 40 MG PO TBEC: 40.0000 mg | DELAYED_RELEASE_TABLET | Freq: Two times a day (BID) | ORAL | 0 refills | Status: DC

## 2023-06-23 NOTE — ED Provider Notes (Signed)
  Provider Note MRN:  [id number]  Arrival date & time: 06/23/23    ED Course and Medical Decision Making  Assumed care of patient at sign-out or upon transfer.  Chest pain favored GI in etiology.  Second troponin has returned and is also negative, appropriate for discharge.  Procedures  Final Clinical Impressions(s) / ED Diagnoses     ICD-10-CM   1. Chest pain, unspecified type  R07.9       ED Discharge Orders          Ordered    sucralfate  (CARAFATE ) 1 g tablet  3 times daily with meals & bedtime        06/23/23 0017    pantoprazole  (PROTONIX ) 40 MG tablet  2 times daily        06/23/23 0017    famotidine  (PEPCID ) 20 MG tablet  2 times daily        06/23/23 0017    oxyCODONE -acetaminophen  (PERCOCET/ROXICET) 5-325 MG tablet  Every 6 hours PRN        06/23/23 0021              Discharge Instructions      Make an appointment with Dr. Cindie, or with your gastroenterologist, as soon as available for further management of pain with diagnosis of reflux.  Increase your Protonix  40 mg from once daily to 2 times a day. Take Pepcid  20 mg 2 times a day. Use Carafate  as prescribed.     Nancy [name]. Theadore, MD [facility] Emergency Medicine [facility] [email or web address]    Nancy Nancy HERO, MD 06/23/23 [phone]

## 2023-06-23 NOTE — Discharge Instructions (Addendum)
 Make an appointment with Dr. Cindie, or with your gastroenterologist, as soon as available for further management of pain with diagnosis of reflux.  Increase your Protonix  40 mg from once daily to 2 times a day. Take Pepcid  20 mg 2 times a day. Use Carafate  as prescribed.

## 2023-06-24 ENCOUNTER — Other Ambulatory Visit: Payer: Self-pay | Admitting: *Deleted

## 2023-06-24 ENCOUNTER — Encounter: Payer: Self-pay | Admitting: *Deleted

## 2023-06-24 ENCOUNTER — Ambulatory Visit: Payer: PPO | Admitting: Internal Medicine

## 2023-06-24 ENCOUNTER — Encounter: Payer: Self-pay | Admitting: Internal Medicine

## 2023-06-24 VITALS — BP 126/84 | HR 109 | Temp 97.5°F | Ht 64.5 in | Wt 174.0 lb

## 2023-06-24 DIAGNOSIS — R079 Chest pain, unspecified: Secondary | ICD-10-CM

## 2023-06-24 DIAGNOSIS — K219 Gastro-esophageal reflux disease without esophagitis: Secondary | ICD-10-CM | POA: Diagnosis not present

## 2023-06-24 DIAGNOSIS — K581 Irritable bowel syndrome with constipation: Secondary | ICD-10-CM | POA: Diagnosis not present

## 2023-06-24 DIAGNOSIS — R1013 Epigastric pain: Secondary | ICD-10-CM

## 2023-06-24 DIAGNOSIS — G8929 Other chronic pain: Secondary | ICD-10-CM

## 2023-06-24 MED ORDER — LUBIPROSTONE 24 MCG PO CAPS: 24.0000 ug | ORAL_CAPSULE | Freq: Two times a day (BID) | ORAL | 1 refills | Status: DC

## 2023-06-24 NOTE — H&P (View-Only) (Signed)
Primary Care Physician:  Ignatius Specking, MD Primary Gastroenterologist:  Dr. Marletta Lor  Chief Complaint  Patient presents with   Gastroesophageal Reflux    Patient here today due to issues with Genella Rife. Patient was seen in the Ed on 06/22/2023, due to chest pain related to reflux, per patient. Patient is taking pantoprazole 40 mg bid, famotidine 10 mg bid,and carafate 1 gm Qid. Patient says she has been aspirating the acid, she says this has flared her asthma and bronchitis.     HPI:   Nancy [name] is a 58 y.o. female who presents to clinic today by referral from her PCP Dr. Sherril Croon for evaluation.  Multiple GI complaints for me today.  Chronic GERD: Has been on pantoprazole daily for many years.  Recent worsening of symptoms with epigastric pain and burning.  Also breakthrough symptoms of acid reflux.  Notes she aspirates acid at times which flares her asthma and bronchitis.  History of chronic asthma as well as allergic rhinitis.  States she had an upper endoscopy many years ago.  I do not have access to report.  Status post cholecystectomy.  Recently increase pantoprazole to twice daily, Carafate 4 times daily, Pepcid twice daily.  No dysphagia odynophagia.  Irritable bowel syndrome with constipation: Chronic issue, currently taking 6 stool softeners daily.  3 in the AM, 3 in the PM.  States her bowels not well-controlled.  Will have days of constipation and days where she has 2-3 bowel movements.  No melena hematochezia.    Colonoscopy 12/12/2020 for screening purposes with sigmoid diverticulosis, melanosis coli, hemorrhoids, hypertrophied anal papillae.  10-year recall.  Past Medical History:  Diagnosis Date   Allergic rhinitis    Asthma    Benign familial tremor    Bipolar 1 disorder (HCC)    Chronic neck and back pain    Degenerative disk disease    DJD (degenerative joint disease), lumbar    Dyslipidemia    Fibromyalgia    GERD (gastroesophageal reflux disease)    Hiatal hernia     History of kidney stones    Irritable bowel syndrome (IBS)    Memory disorder 07/18/2014   Memory loss    Migraine headache    Nephrolithiasis    Pneumonia 1986   Serotonin syndrome    Tremor    hands,arms    Past Surgical History:  Procedure Laterality Date   ANKLE FRACTURE SURGERY  2006   Right   APPENDECTOMY     back fusion     BACK SURGERY     BRONCHOSCOPY     S. aureus from BAL   CARDIAC CATHETERIZATION  11/27/2006   minimal CAD   CHOLECYSTECTOMY     COLONOSCOPY WITH PROPOFOL N/A 12/12/2020   Procedure: COLONOSCOPY WITH PROPOFOL;  Surgeon: Malissa Hippo, MD;  Location: [facility] SUITE;  Service: Endoscopy;  Laterality: N/A;  820   ESOPHAGEAL DILATION  08/06/2004   KNEE ARTHROSCOPY WITH MEDIAL MENISECTOMY Left 08/09/2021   Procedure: KNEE ARTHROSCOPY WITH PARTIAL MEDIAL MENISCECTOMY;  Surgeon: Vickki Hearing, MD;  Location: [facility] ORS;  Service: Orthopedics;  Laterality: Left;   LAPAROSCOPIC CHOLECYSTECTOMY     LASER ABLATION OF THE CERVIX     MICRODISSECTION L5-S1  08/13/2000   neck fusion     TONSILLECTOMY      Current Outpatient Medications  Medication Sig Dispense Refill   albuterol (PROVENTIL) (2.5 MG/3ML) 0.083% nebulizer solution Take 2.5 mg by nebulization every 6 (six) hours as needed for  shortness of breath or wheezing.     Ascorbic Acid (VITAMIN C) 1000 MG tablet Take 1,000 mg by mouth daily.     atorvastatin (LIPITOR) 10 MG tablet Take 10 mg by mouth daily.     BLACK ELDERBERRY PO Take 1 each by mouth daily. Elderberry gummy     BREZTRI AEROSPHERE 160-9-4.8 MCG/ACT AERO INHALE 2 PUFFS INTO THE LUNGS IN THE MORNING AND AT BEDTIME. 10.7 each 3   carbamazepine (TEGRETOL) 100 MG chewable tablet Chew 300 mg by mouth at bedtime.     cetirizine (ZYRTEC) 10 MG tablet Take 10 mg by mouth daily.     Cholecalciferol (VITAMIN D3) 50 MCG (2000 UT) capsule Take 2,000 Units by mouth daily.     Cyanocobalamin (VITAMIN B-12) 2500 MCG SUBL Place 2,500 mcg under the  tongue daily.     cyclobenzaprine (FLEXERIL) 10 MG tablet Take 10 mg by mouth at bedtime.     docusate sodium (COLACE) 50 MG capsule Take 150 mg by mouth 2 (two) times daily.     EPINEPHrine 0.3 mg/0.3 mL IJ SOAJ injection Inject 0.3 mg into the muscle as needed for anaphylaxis.     estradiol (ESTRACE) 0.1 MG/GM vaginal cream Place vaginally.     famotidine (PEPCID) 20 MG tablet Take 0.5 tablets (10 mg total) by mouth 2 (two) times daily. 20 tablet 0   gabapentin (NEURONTIN) 300 MG capsule Take 300 mg by mouth at bedtime.     lamoTRIgine (LAMICTAL) 150 MG tablet Take 300 mg by mouth at bedtime.     lisinopril (ZESTRIL) 5 MG tablet Take 1 tablet by mouth daily.     meloxicam (MOBIC) 15 MG tablet Take 15 mg by mouth daily.     Mepolizumab (NUCALA) 100 MG/ML SOAJ Inject 1 mL (100 mg total) into the skin every 28 (twenty-eight) days. 1 mL 5   methocarbamol (ROBAXIN) 500 MG tablet Take 500 mg by mouth 2 (two) times daily.     montelukast (SINGULAIR) 10 MG tablet Take 10 mg by mouth at bedtime.     oxyCODONE-acetaminophen (PERCOCET/ROXICET) 5-325 MG tablet Take 1 tablet by mouth every 6 (six) hours as needed for severe pain (pain score 7-10). 8 tablet 0   pantoprazole (PROTONIX) 40 MG tablet Take 1 tablet (40 mg total) by mouth 2 (two) times daily. 30 tablet 0   primidone (MYSOLINE) 50 MG tablet Take 1 tablet (50 mg total) by mouth 3 (three) times daily. (Patient taking differently: Take 50 mg by mouth 2 (two) times daily.) 270 tablet 3   rizatriptan (MAXALT-MLT) 10 MG disintegrating tablet Take 1 tablet (10 mg total) by mouth as needed for migraine. May repeat in 2 hours if needed 9 tablet 11   sucralfate (CARAFATE) 1 g tablet Take 1 tablet (1 g total) by mouth 4 (four) times daily -  with meals and at bedtime. 40 tablet 0   topiramate (TOPAMAX) 50 MG tablet Take 1 tablet (50 mg total) by mouth 2 (two) times daily. 180 tablet 3   No current facility-administered medications for this visit.     Allergies as of 06/24/2023 - Review Complete 06/24/2023  Allergen Reaction Noted   Cymbalta [duloxetine hcl] Other (See Comments) 03/03/2011   Divalproex sodium Other (See Comments) 03/04/2011   Effexor [venlafaxine hydrochloride] Other (See Comments) 03/04/2011   Geodon [ziprasidone hydrochloride] Other (See Comments) 03/04/2011   Prozac [fluoxetine hcl] Other (See Comments) 03/03/2011   Thorazine [chlorpromazine hcl] Other (See Comments) 03/04/2011   Cephalexin Rash 03/04/2011  Cephalosporins Rash 03/03/2011   Codeine Nausea Only 10/27/2016   Morphine Nausea Only 11/17/2018   Theo-dur [theophylline] Palpitations 03/04/2011   Ziprasidone Nausea Only and Other (See Comments) 03/04/2011    Family History  Problem Relation Age of Onset   Allergies Mother    Clotting disorder Mother    Diabetes Mother    Allergies Father    Heart disease Father    Heart attack Father    Diabetes Father    Allergies Brother    Chorea Maternal Grandmother     Social History   Socioeconomic History   Marital status: Married    Spouse name: Not on file   Number of children: 1   Years of education: HS   Highest education level: Not on file  Occupational History   Occupation: disabled    Employer: UNEMPLOYED  Tobacco Use   Smoking status: Former    Current packs/day: 0.00    Average packs/day: 0.5 packs/day for 8.0 years (4.0 ttl pk-yrs)    Types: Cigarettes    Start date: 06/09/1970    Quit date: 06/09/1978    Years since quitting: 45.0   Smokeless tobacco: Never  Vaping Use   Vaping status: Never Used  Substance and Sexual Activity   Alcohol use: No   Drug use: No   Sexual activity: Not on file  Other Topics Concern   Not on file  Social History Narrative   Patient is right handed.   Patient does not drink caffeine.   Social Drivers of Corporate investment banker Strain: Not on file  Food Insecurity: Low Risk  (06/12/2023)   Received from [facility]   Hunger Vital Sign     Worried About Running Out of Food in the Last Year: Never true    Ran Out of Food in the Last Year: Never true  Transportation Needs: No Transportation Needs (06/12/2023)   Received from [facility]    In the past 12 months, has lack of reliable transportation kept you from medical appointments, meetings, work or from getting things needed for daily living? : No  Physical Activity: Not on file  Stress: Not on file  Social Connections: Not on file  Intimate Partner Violence: Not At Risk (12/16/2022)   Received from [facility]   Humiliation, Afraid, Rape, and Kick questionnaire    Fear of Current or Ex-Partner: No    Emotionally Abused: No    Physically Abused: No    Sexually Abused: No    Subjective: Review of Systems  Constitutional:  Negative for chills and fever.  HENT:  Negative for congestion and hearing loss.   Eyes:  Negative for blurred vision and double vision.  Respiratory:  Negative for cough and shortness of breath.   Cardiovascular:  Negative for chest pain and palpitations.  Gastrointestinal:  Positive for abdominal pain, constipation and heartburn. Negative for blood in stool, diarrhea, melena and vomiting.  Genitourinary:  Negative for dysuria and urgency.  Musculoskeletal:  Negative for joint pain and myalgias.  Skin:  Negative for itching and rash.  Neurological:  Negative for dizziness and headaches.  Psychiatric/Behavioral:  Negative for depression. The patient is not nervous/anxious.        Objective: BP 126/84 (BP Location: Left Arm, Patient Position: Sitting, Cuff Size: Large)   Pulse (!) 109   Temp (!) 97.5 F (36.4 C) (Temporal)   Ht 5' 4.5" (1.638 m)   Wt 174 lb (78.9 kg)  LMP 11/28/2013 Comment: IUD  BMI 29.41 kg/m  Physical Exam Constitutional:      Appearance: Normal appearance.  HENT:     Head: Normocephalic and atraumatic.  Eyes:     Extraocular Movements: Extraocular movements intact.     Conjunctiva/sclera:  Conjunctivae normal.  Cardiovascular:     Rate and Rhythm: Normal rate and regular rhythm.  Pulmonary:     Effort: Pulmonary effort is normal.     Breath sounds: Normal breath sounds.  Abdominal:     General: Bowel sounds are normal.     Palpations: Abdomen is soft.  Musculoskeletal:        General: No swelling. Normal range of motion.     Cervical back: Normal range of motion and neck supple.  Skin:    General: Skin is warm and dry.     Coloration: Skin is not jaundiced.  Neurological:     General: No focal deficit present.     Mental Status: She is alert and oriented to person, place, and time.  Psychiatric:        Mood and Affect: Mood normal.        Behavior: Behavior normal.      Assessment/Plan:  1.  Chronic GERD, epigastric/chest pain- Will schedule for EGD to evaluate for peptic ulcer disease, esophagitis, gastritis, H. Pylori, duodenitis, or other. Will also evaluate for esophageal stricture, Schatzki's ring, esophageal web or other.   The risks including infection, bleed, or perforation as well as benefits, limitations, alternatives and imponderables have been reviewed with the patient. Potential for esophageal dilation, biopsy, etc. have also been reviewed.  Questions have been answered. All parties agreeable.  Continue on pantoprazole twice daily, Pepcid twice daily.  2.  Irritable bowel syndrome with constipation-currently taking 6 stool softeners without adequate relief.  Discussed prescription medications.  Linzess not covered by her insurance.  Will trial on Amitiza 24 mcg twice daily.  Counseled on initial washout.  She understands.  Thank you Dr. Sherril Croon for the kind referral. 06/24/2023 3:03 PM   Disclaimer: This note was dictated with voice recognition software. Similar sounding words can inadvertently be transcribed and may not be corrected upon review.

## 2023-06-24 NOTE — Patient Instructions (Signed)
 We will schedule you for upper endoscopy to further evaluate your acid reflux and epigastric pain.  Continue on pantoprazole  and Pepcid .  You can crush up your Carafate  mix with 15 to 20 mL of water  to make a slurry.  For your constipation, will send in a prescription for Amitiza  twice daily.  You may have an initial washout period with this medication.  It was very nice meeting you today.  Dr. Mordechai April

## 2023-06-24 NOTE — Progress Notes (Signed)
 Primary Care Physician:  Ignatius Specking, MD Primary Gastroenterologist:  Dr. Marletta Lor  Chief Complaint  Patient presents with   Gastroesophageal Reflux    Patient here today due to issues with Genella Rife. Patient was seen in the Ed on 06/22/2023, due to chest pain related to reflux, per patient. Patient is taking pantoprazole 40 mg bid, famotidine 10 mg bid,and carafate 1 gm Qid. Patient says she has been aspirating the acid, she says this has flared her asthma and bronchitis.     HPI:   Nancy [name] is a 58 y.o. female who presents to clinic today by referral from her PCP Dr. Sherril Croon for evaluation.  Multiple GI complaints for me today.  Chronic GERD: Has been on pantoprazole daily for many years.  Recent worsening of symptoms with epigastric pain and burning.  Also breakthrough symptoms of acid reflux.  Notes she aspirates acid at times which flares her asthma and bronchitis.  History of chronic asthma as well as allergic rhinitis.  States she had an upper endoscopy many years ago.  I do not have access to report.  Status post cholecystectomy.  Recently increase pantoprazole to twice daily, Carafate 4 times daily, Pepcid twice daily.  No dysphagia odynophagia.  Irritable bowel syndrome with constipation: Chronic issue, currently taking 6 stool softeners daily.  3 in the AM, 3 in the PM.  States her bowels not well-controlled.  Will have days of constipation and days where she has 2-3 bowel movements.  No melena hematochezia.    Colonoscopy 12/12/2020 for screening purposes with sigmoid diverticulosis, melanosis coli, hemorrhoids, hypertrophied anal papillae.  10-year recall.  Past Medical History:  Diagnosis Date   Allergic rhinitis    Asthma    Benign familial tremor    Bipolar 1 disorder (HCC)    Chronic neck and back pain    Degenerative disk disease    DJD (degenerative joint disease), lumbar    Dyslipidemia    Fibromyalgia    GERD (gastroesophageal reflux disease)    Hiatal hernia     History of kidney stones    Irritable bowel syndrome (IBS)    Memory disorder 07/18/2014   Memory loss    Migraine headache    Nephrolithiasis    Pneumonia 1986   Serotonin syndrome    Tremor    hands,arms    Past Surgical History:  Procedure Laterality Date   ANKLE FRACTURE SURGERY  2006   Right   APPENDECTOMY     back fusion     BACK SURGERY     BRONCHOSCOPY     S. aureus from BAL   CARDIAC CATHETERIZATION  11/27/2006   minimal CAD   CHOLECYSTECTOMY     COLONOSCOPY WITH PROPOFOL N/A 12/12/2020   Procedure: COLONOSCOPY WITH PROPOFOL;  Surgeon: Malissa Hippo, MD;  Location: [facility];  Service: Endoscopy;  Laterality: N/A;  820   ESOPHAGEAL DILATION  08/06/2004   KNEE ARTHROSCOPY WITH MEDIAL MENISECTOMY Left 08/09/2021   Procedure: KNEE ARTHROSCOPY WITH PARTIAL MEDIAL MENISCECTOMY;  Surgeon: Vickki Hearing, MD;  Location: [facility];  Service: Orthopedics;  Laterality: Left;   LAPAROSCOPIC CHOLECYSTECTOMY     LASER ABLATION OF THE CERVIX     MICRODISSECTION L5-S1  08/13/2000   neck fusion     TONSILLECTOMY      Current Outpatient Medications  Medication Sig Dispense Refill   albuterol (PROVENTIL) (2.5 MG/3ML) 0.083% nebulizer solution Take 2.5 mg by nebulization every 6 (six) hours as needed for  shortness of breath or wheezing.     Ascorbic Acid (VITAMIN C) 1000 MG tablet Take 1,000 mg by mouth daily.     atorvastatin (LIPITOR) 10 MG tablet Take 10 mg by mouth daily.     BLACK ELDERBERRY PO Take 1 each by mouth daily. Elderberry gummy     BREZTRI AEROSPHERE 160-9-4.8 MCG/ACT AERO INHALE 2 PUFFS INTO THE LUNGS IN THE MORNING AND AT BEDTIME. 10.7 each 3   carbamazepine (TEGRETOL) 100 MG chewable tablet Chew 300 mg by mouth at bedtime.     cetirizine (ZYRTEC) 10 MG tablet Take 10 mg by mouth daily.     Cholecalciferol (VITAMIN D3) 50 MCG (2000 UT) capsule Take 2,000 Units by mouth daily.     Cyanocobalamin (VITAMIN B-12) 2500 MCG SUBL Place 2,500 mcg under the  tongue daily.     cyclobenzaprine (FLEXERIL) 10 MG tablet Take 10 mg by mouth at bedtime.     docusate sodium (COLACE) 50 MG capsule Take 150 mg by mouth 2 (two) times daily.     EPINEPHrine 0.3 mg/0.3 mL IJ SOAJ injection Inject 0.3 mg into the muscle as needed for anaphylaxis.     estradiol (ESTRACE) 0.1 MG/GM vaginal cream Place vaginally.     famotidine (PEPCID) 20 MG tablet Take 0.5 tablets (10 mg total) by mouth 2 (two) times daily. 20 tablet 0   gabapentin (NEURONTIN) 300 MG capsule Take 300 mg by mouth at bedtime.     lamoTRIgine (LAMICTAL) 150 MG tablet Take 300 mg by mouth at bedtime.     lisinopril (ZESTRIL) 5 MG tablet Take 1 tablet by mouth daily.     meloxicam (MOBIC) 15 MG tablet Take 15 mg by mouth daily.     Mepolizumab (NUCALA) 100 MG/ML SOAJ Inject 1 mL (100 mg total) into the skin every 28 (twenty-eight) days. 1 mL 5   methocarbamol (ROBAXIN) 500 MG tablet Take 500 mg by mouth 2 (two) times daily.     montelukast (SINGULAIR) 10 MG tablet Take 10 mg by mouth at bedtime.     oxyCODONE-acetaminophen (PERCOCET/ROXICET) 5-325 MG tablet Take 1 tablet by mouth every 6 (six) hours as needed for severe pain (pain score 7-10). 8 tablet 0   pantoprazole (PROTONIX) 40 MG tablet Take 1 tablet (40 mg total) by mouth 2 (two) times daily. 30 tablet 0   primidone (MYSOLINE) 50 MG tablet Take 1 tablet (50 mg total) by mouth 3 (three) times daily. (Patient taking differently: Take 50 mg by mouth 2 (two) times daily.) 270 tablet 3   rizatriptan (MAXALT-MLT) 10 MG disintegrating tablet Take 1 tablet (10 mg total) by mouth as needed for migraine. May repeat in 2 hours if needed 9 tablet 11   sucralfate (CARAFATE) 1 g tablet Take 1 tablet (1 g total) by mouth 4 (four) times daily -  with meals and at bedtime. 40 tablet 0   topiramate (TOPAMAX) 50 MG tablet Take 1 tablet (50 mg total) by mouth 2 (two) times daily. 180 tablet 3   No current facility-administered medications for this visit.     Allergies as of 06/24/2023 - Review Complete 06/24/2023  Allergen Reaction Noted   Cymbalta [duloxetine hcl] Other (See Comments) 03/03/2011   Divalproex sodium Other (See Comments) 03/04/2011   Effexor [venlafaxine hydrochloride] Other (See Comments) 03/04/2011   Geodon [ziprasidone hydrochloride] Other (See Comments) 03/04/2011   Prozac [fluoxetine hcl] Other (See Comments) 03/03/2011   Thorazine [chlorpromazine hcl] Other (See Comments) 03/04/2011   Cephalexin Rash 03/04/2011  Cephalosporins Rash 03/03/2011   Codeine Nausea Only 10/27/2016   Morphine Nausea Only 11/17/2018   Theo-dur [theophylline] Palpitations 03/04/2011   Ziprasidone Nausea Only and Other (See Comments) 03/04/2011    Family History  Problem Relation Age of Onset   Allergies Mother    Clotting disorder Mother    Diabetes Mother    Allergies Father    Heart disease Father    Heart attack Father    Diabetes Father    Allergies Brother    Chorea Maternal Grandmother     Social History   Socioeconomic History   Marital status: Married    Spouse name: Not on file   Number of children: 1   Years of education: HS   Highest education level: Not on file  Occupational History   Occupation: disabled    Employer: UNEMPLOYED  Tobacco Use   Smoking status: Former    Current packs/day: 0.00    Average packs/day: 0.5 packs/day for 8.0 years (4.0 ttl pk-yrs)    Types: Cigarettes    Start date: 06/09/1970    Quit date: 06/09/1978    Years since quitting: 45.0   Smokeless tobacco: Never  Vaping Use   Vaping status: Never Used  Substance and Sexual Activity   Alcohol use: No   Drug use: No   Sexual activity: Not on file  Other Topics Concern   Not on file  Social History Narrative   Patient is right handed.   Patient does not drink caffeine.   Social Drivers of Corporate investment banker Strain: Not on file  Food Insecurity: Low Risk  (06/12/2023)   Received from [facility]   Hunger Vital Sign     Worried About Running Out of Food in the Last Year: Never true    Ran Out of Food in the Last Year: Never true  Transportation Needs: No Transportation Needs (06/12/2023)   Received from [facility]    In the past 12 months, has lack of reliable transportation kept you from medical appointments, meetings, work or from getting things needed for daily living? : No  Physical Activity: Not on file  Stress: Not on file  Social Connections: Not on file  Intimate Partner Violence: Not At Risk (12/16/2022)   [facility] from [facility]   Humiliation, Afraid, Rape, and Kick questionnaire    Fear of Current or Ex-Partner: No    Emotionally Abused: No    Physically Abused: No    Sexually Abused: No    Subjective: Review of Systems  Constitutional:  Negative for chills and fever.  HENT:  Negative for congestion and hearing loss.   Eyes:  Negative for blurred vision and double vision.  Respiratory:  Negative for cough and shortness of breath.   Cardiovascular:  Negative for chest pain and palpitations.  Gastrointestinal:  Positive for abdominal pain, constipation and heartburn. Negative for blood in stool, diarrhea, melena and vomiting.  Genitourinary:  Negative for dysuria and urgency.  Musculoskeletal:  Negative for joint pain and myalgias.  Skin:  Negative for itching and rash.  Neurological:  Negative for dizziness and headaches.  Psychiatric/Behavioral:  Negative for depression. The patient is not nervous/anxious.        Objective: BP 126/84 (BP Location: Left Arm, Patient Position: Sitting, Cuff Size: Large)   Pulse (!) 109   Temp (!) 97.5 F (36.4 C) (Temporal)   Ht 5' 4.5" (1.638 m)   Wt 174 lb (78.9 kg)  LMP 11/28/2013 Comment: IUD  BMI 29.41 kg/m  Physical Exam Constitutional:      Appearance: Normal appearance.  HENT:     Head: Normocephalic and atraumatic.  Eyes:     Extraocular Movements: Extraocular movements intact.     Conjunctiva/sclera:  Conjunctivae normal.  Cardiovascular:     Rate and Rhythm: Normal rate and regular rhythm.  Pulmonary:     Effort: Pulmonary effort is normal.     Breath sounds: Normal breath sounds.  Abdominal:     General: Bowel sounds are normal.     Palpations: Abdomen is soft.  Musculoskeletal:        General: No swelling. Normal range of motion.     Cervical back: Normal range of motion and neck supple.  Skin:    General: Skin is warm and dry.     Coloration: Skin is not jaundiced.  Neurological:     General: No focal deficit present.     Mental Status: She is alert and oriented to person, place, and time.  Psychiatric:        Mood and Affect: Mood normal.        Behavior: Behavior normal.      Assessment/Plan:  1.  Chronic GERD, epigastric/chest pain- Will schedule for EGD to evaluate for peptic ulcer disease, esophagitis, gastritis, H. Pylori, duodenitis, or other. Will also evaluate for esophageal stricture, Schatzki's ring, esophageal web or other.   The risks including infection, bleed, or perforation as well as benefits, limitations, alternatives and imponderables have been reviewed with the patient. Potential for esophageal dilation, biopsy, etc. have also been reviewed.  Questions have been answered. All parties agreeable.  Continue on pantoprazole twice daily, Pepcid twice daily.  2.  Irritable bowel syndrome with constipation-currently taking 6 stool softeners without adequate relief.  Discussed prescription medications.  Linzess not covered by her insurance.  Will trial on Amitiza 24 mcg twice daily.  Counseled on initial washout.  She understands.  Thank you Dr. Sherril Croon for the kind referral. 06/24/2023 3:03 PM   Disclaimer: This note was dictated with voice recognition software. Similar sounding words can inadvertently be transcribed and may not be corrected upon review.

## 2023-07-01 ENCOUNTER — Ambulatory Visit (HOSPITAL_COMMUNITY): Payer: PPO

## 2023-07-01 DIAGNOSIS — M79672 Pain in left foot: Secondary | ICD-10-CM | POA: Diagnosis not present

## 2023-07-01 DIAGNOSIS — M79675 Pain in left toe(s): Secondary | ICD-10-CM | POA: Diagnosis not present

## 2023-07-01 DIAGNOSIS — S93332A Other subluxation of left foot, initial encounter: Secondary | ICD-10-CM | POA: Diagnosis not present

## 2023-07-07 ENCOUNTER — Ambulatory Visit (HOSPITAL_COMMUNITY): Payer: PPO | Admitting: Anesthesiology

## 2023-07-07 ENCOUNTER — Telehealth: Payer: Self-pay

## 2023-07-07 ENCOUNTER — Encounter (HOSPITAL_COMMUNITY): Payer: Self-pay | Admitting: Internal Medicine

## 2023-07-07 ENCOUNTER — Encounter (HOSPITAL_COMMUNITY)
Admission: RE | Disposition: A | Discharge status: Home / Self Care | Payer: Self-pay | Source: Home / Self Care | Attending: Internal Medicine

## 2023-07-07 ENCOUNTER — Other Ambulatory Visit: Payer: Self-pay

## 2023-07-07 ENCOUNTER — Ambulatory Visit (HOSPITAL_COMMUNITY)
Admission: RE | Admit: 2023-07-07 | Discharge: 2023-07-07 | Disposition: A | Discharge status: Home / Self Care | Payer: PPO | Attending: Internal Medicine | Admitting: Internal Medicine

## 2023-07-07 ENCOUNTER — Other Ambulatory Visit: Payer: Self-pay | Admitting: Internal Medicine

## 2023-07-07 DIAGNOSIS — Z87891 Personal history of nicotine dependence: Secondary | ICD-10-CM | POA: Diagnosis not present

## 2023-07-07 DIAGNOSIS — K3189 Other diseases of stomach and duodenum: Secondary | ICD-10-CM

## 2023-07-07 DIAGNOSIS — J189 Pneumonia, unspecified organism: Secondary | ICD-10-CM

## 2023-07-07 DIAGNOSIS — K219 Gastro-esophageal reflux disease without esophagitis: Secondary | ICD-10-CM | POA: Diagnosis not present

## 2023-07-07 DIAGNOSIS — K449 Diaphragmatic hernia without obstruction or gangrene: Secondary | ICD-10-CM | POA: Diagnosis not present

## 2023-07-07 DIAGNOSIS — K581 Irritable bowel syndrome with constipation: Secondary | ICD-10-CM | POA: Insufficient documentation

## 2023-07-07 DIAGNOSIS — R1013 Epigastric pain: Secondary | ICD-10-CM | POA: Insufficient documentation

## 2023-07-07 DIAGNOSIS — Z79899 Other long term (current) drug therapy: Secondary | ICD-10-CM | POA: Diagnosis not present

## 2023-07-07 DIAGNOSIS — J45909 Unspecified asthma, uncomplicated: Secondary | ICD-10-CM | POA: Diagnosis not present

## 2023-07-07 DIAGNOSIS — F319 Bipolar disorder, unspecified: Secondary | ICD-10-CM | POA: Insufficient documentation

## 2023-07-07 DIAGNOSIS — Z9049 Acquired absence of other specified parts of digestive tract: Secondary | ICD-10-CM | POA: Insufficient documentation

## 2023-07-07 DIAGNOSIS — K2970 Gastritis, unspecified, without bleeding: Secondary | ICD-10-CM

## 2023-07-07 DIAGNOSIS — I10 Essential (primary) hypertension: Secondary | ICD-10-CM

## 2023-07-07 HISTORY — DX: Depression, unspecified: F32.A

## 2023-07-07 HISTORY — PX: BIOPSY: SHX5522

## 2023-07-07 HISTORY — PX: ESOPHAGOGASTRODUODENOSCOPY (EGD) WITH PROPOFOL: SHX5813

## 2023-07-07 SURGERY — ESOPHAGOGASTRODUODENOSCOPY (EGD) WITH PROPOFOL
Anesthesia: General

## 2023-07-07 MED ORDER — PROPOFOL 500 MG/50ML IV EMUL
INTRAVENOUS | Status: DC | PRN
  Administered 2023-07-07: 150 ug/kg/min via INTRAVENOUS

## 2023-07-07 MED ORDER — DEXLANSOPRAZOLE 60 MG PO CPDR: 60.0000 mg | DELAYED_RELEASE_CAPSULE | Freq: Every day | ORAL | 11 refills | Status: DC

## 2023-07-07 MED ORDER — PROPOFOL 10 MG/ML IV BOLUS
INTRAVENOUS | Status: DC | PRN
  Administered 2023-07-07: 50 mg via INTRAVENOUS

## 2023-07-07 MED ORDER — LACTATED RINGERS IV SOLN: INTRAVENOUS | Status: DC

## 2023-07-07 MED ORDER — LIDOCAINE HCL (CARDIAC) PF 100 MG/5ML IV SOSY
PREFILLED_SYRINGE | INTRAVENOUS | Status: DC | PRN
  Administered 2023-07-07: 50 mg via INTRATRACHEAL

## 2023-07-07 NOTE — Interval H&P Note (Signed)
History and Physical Interval Note:  07/07/2023 1:15 PM  Nancy [name]  has presented today for surgery, with the diagnosis of epigastric pain, gerd.  The various methods of treatment have been discussed with the patient and family. After consideration of risks, benefits and other options for treatment, the patient has consented to  Procedure(s) with comments: ESOPHAGOGASTRODUODENOSCOPY (EGD) WITH PROPOFOL (N/A) - 200pm, asa 2 as a surgical intervention.  The patient's history has been reviewed, patient examined, no change in status, stable for surgery.  I have reviewed the patient's chart and labs.  Questions were answered to the patient's satisfaction.     Lanelle Bal

## 2023-07-07 NOTE — Transfer of Care (Signed)
Immediate Anesthesia Transfer of Care Note  Patient: Nancy [name]  Procedure(s) Performed: ESOPHAGOGASTRODUODENOSCOPY (EGD) WITH PROPOFOL BIOPSY  Patient Location: PACU  Anesthesia Type:General  Level of Consciousness: awake  Airway & Oxygen Therapy: Patient Spontanous Breathing  Post-op Assessment: Report given to RN  Post vital signs: Reviewed and stable  Last Vitals:  Vitals Value Taken Time  BP    Temp    Pulse    Resp    SpO2      Last Pain:  Vitals:   07/07/23 1320  TempSrc:   PainSc: 2       Patients Stated Pain Goal: 4 (07/07/23 1242)  Complications: No notable events documented.

## 2023-07-07 NOTE — Telephone Encounter (Signed)
PA done on Cover My Meds for Dexlansoprazole 60mg . Dx used: K21.9 , R10.13. pt has tried and failed: Pantoprazole 40 mg, Famotidine and Carafate. Waiting on a response

## 2023-07-07 NOTE — Anesthesia Postprocedure Evaluation (Signed)
Anesthesia Post Note  Patient: Nancy [name]  Procedure(s) Performed: ESOPHAGOGASTRODUODENOSCOPY (EGD) WITH PROPOFOL BIOPSY  Patient location during evaluation: Endoscopy Anesthesia Type: General Level of consciousness: awake and alert Pain management: pain level controlled Vital Signs Assessment: post-procedure vital signs reviewed and stable Respiratory status: spontaneous breathing Cardiovascular status: blood pressure returned to baseline and stable Postop Assessment: no apparent nausea or vomiting Anesthetic complications: no   No notable events documented.   Last Vitals:  Vitals:   07/07/23 1242 07/07/23 1339  BP: 131/75 120/76  Pulse: 90 90  Resp: 11 16  Temp: 36.4 C 36.5 C  SpO2: 98% 97%    Last Pain:  Vitals:   07/07/23 1339  TempSrc: Oral  PainSc: 2                  Galen Russman

## 2023-07-07 NOTE — Discharge Instructions (Addendum)
EGD Discharge instructions Please read the instructions outlined below and refer to this sheet in the next few weeks. These discharge instructions provide you with general information on caring for yourself after you leave the hospital. Your doctor may also give you specific instructions. While your treatment has been planned according to the most current medical practices available, unavoidable complications occasionally occur. If you have any problems or questions after discharge, please call your doctor. ACTIVITY You may resume your regular activity but move at a slower pace for the next 24 hours.  Take frequent rest periods for the next 24 hours.  Walking will help expel (get rid of) the air and reduce the bloated feeling in your abdomen.  No driving for 24 hours (because of the anesthesia (medicine) used during the test).  You may shower.  Do not sign any important legal documents or operate any machinery for 24 hours (because of the anesthesia used during the test).  NUTRITION Drink plenty of fluids.  You may resume your normal diet.  Begin with a light meal and progress to your normal diet.  Avoid alcoholic beverages for 24 hours or as instructed by your caregiver.  MEDICATIONS You may resume your normal medications unless your caregiver tells you otherwise.  WHAT YOU CAN EXPECT TODAY You may experience abdominal discomfort such as a feeling of fullness or "gas" pains.  FOLLOW-UP Your doctor will discuss the results of your test with you.  SEEK IMMEDIATE MEDICAL ATTENTION IF ANY OF THE FOLLOWING OCCUR: Excessive nausea (feeling sick to your stomach) and/or vomiting.  Severe abdominal pain and distention (swelling).  Trouble swallowing.  Temperature over 101 F (37.8 C).  Rectal bleeding or vomiting of blood.    Your EGD revealed mild amount inflammation in your stomach.  I took biopsies of this to rule out infection with a bacteria called H. pylori.  Await pathology results, my  office will contact you.  Esophagus and small bowel appeared normal.  I am going to change your pantoprazole to generic Dexilant 60 mg daily.  This medication works best if you take at least 30 min before breakfast.  Let us know if it is not covered we can try something else.  Follow-up in GI office in 8 weeks. A message was sent to the office about this appointment.   I hope you have a great rest of your week!  Hennie Duos. Marletta Lor, D.O. Gastroenterology and Hepatology [facility]

## 2023-07-07 NOTE — Anesthesia Preprocedure Evaluation (Signed)
Anesthesia Evaluation  Patient identified by MRN, date of birth, ID band Patient awake    Reviewed: Allergy & Precautions, H&P , NPO status , Patient's Chart, lab work & pertinent test results, reviewed documented beta blocker date and time   Airway Mallampati: II  TM Distance: >3 FB Neck ROM: full    Dental no notable dental hx.    Pulmonary neg pulmonary ROS, asthma , pneumonia, former smoker   Pulmonary exam normal breath sounds clear to auscultation       Cardiovascular Exercise Tolerance: Good hypertension, negative cardio ROS  Rhythm:regular Rate:Normal     Neuro/Psych  Headaches PSYCHIATRIC DISORDERS   Bipolar Disorder    Neuromuscular disease negative neurological ROS  negative psych ROS   GI/Hepatic negative GI ROS, Neg liver ROS, hiatal hernia,GERD  ,,  Endo/Other  negative endocrine ROS    Renal/GU Renal diseasenegative Renal ROS  negative genitourinary   Musculoskeletal   Abdominal   Peds  Hematology negative hematology ROS (+)   Anesthesia Other Findings   Reproductive/Obstetrics negative OB ROS                             Anesthesia Physical Anesthesia Plan  ASA: 3  Anesthesia Plan: General   Post-op Pain Management:    Induction:   PONV Risk Score and Plan: Propofol infusion  Airway Management Planned:   Additional Equipment:   Intra-op Plan:   Post-operative Plan:   Informed Consent: I have reviewed the patients History and Physical, chart, labs and discussed the procedure including the risks, benefits and alternatives for the proposed anesthesia with the patient or authorized representative who has indicated his/her understanding and acceptance.     Dental Advisory Given  Plan Discussed with: CRNA  Anesthesia Plan Comments:        Anesthesia Quick Evaluation

## 2023-07-07 NOTE — Op Note (Signed)
[facility] Patient Name: Nancy [name] Procedure Date: 07/07/2023 1:13 PM MRN: [id number] Date of Birth: [birth year] Attending MD: Hennie Duos. Marletta Lor , [place], [id number] CSN: [id number] Age: 58 Admit Type: Outpatient Procedure:                Upper GI endoscopy Indications:              Epigastric abdominal pain, Heartburn Providers:                Hennie Duos. Marletta Lor, DO, Crystal Page, Durwin Glaze Tech, Technician Referring MD:              Medicines:                See the Anesthesia note for documentation of the                            administered medications Complications:            No immediate complications. Estimated Blood Loss:     Estimated blood loss was minimal. Procedure:                Pre-Anesthesia Assessment:                           - The anesthesia plan was to use monitored                            anesthesia care (MAC).                           After obtaining informed consent, the endoscope was                            passed under direct vision. Throughout the                            procedure, the patient's blood pressure, pulse, and                            oxygen saturations were monitored continuously. The                            [id number]-[id number] ([id number]) scope was introduced through the                            mouth, and advanced to the second part of duodenum.                            The upper GI endoscopy was accomplished without                            difficulty. The patient tolerated the procedure                            well. Scope In:  1:28:33 PM Scope Out: 1:31:29 PM Total Procedure Duration: 0 hours 2 minutes 56 seconds  Findings:      The Z-line was regular and was found 38 cm from the incisors.      Patchy mild inflammation characterized by erythema was found in the       gastric body and in the gastric antrum. Biopsies were taken with a cold       forceps for Helicobacter pylori testing.       The duodenal bulb, first portion of the duodenum and second portion of       the duodenum were normal. Impression:               - Z-line regular, 38 cm from the incisors.                           - Gastritis. Biopsied.                           - Normal duodenal bulb, first portion of the                            duodenum and second portion of the duodenum. Moderate Sedation:      Per Anesthesia Care Recommendation:           - Patient has a contact number available for                            emergencies. The signs and symptoms of potential                            delayed complications were discussed with the                            patient. Return to normal activities tomorrow.                            Written discharge instructions were provided to the                            patient.                           - Resume previous diet.                           - Continue present medications.                           - Await pathology results.                           - Return to GI clinic in 8 weeks.                           - Use Dexilant (dexlansoprazole) 60 mg PO daily. Procedure Code(s):        --- Professional ---  95621, Esophagogastroduodenoscopy, flexible,                            transoral; with biopsy, single or multiple Diagnosis Code(s):        --- Professional ---                           K29.70, Gastritis, unspecified, without bleeding                           R10.13, Epigastric pain                           R12, Heartburn CPT copyright 2022 American Medical Association. All rights reserved. The codes documented in this report are preliminary and upon coder review may  be revised to meet current compliance requirements. Hennie Duos. Marletta Lor, DO Hennie Duos. Marletta Lor, DO 07/07/2023 1:45:32 PM This report has been signed electronically. Number of Addenda: 0

## 2023-07-08 ENCOUNTER — Encounter (HOSPITAL_COMMUNITY): Payer: Self-pay | Admitting: Internal Medicine

## 2023-07-08 LAB — SURGICAL PATHOLOGY

## 2023-07-08 NOTE — Telephone Encounter (Signed)
Phoned and advised the pt of the approval for Dexlansoprazole and also faxed to pharmacy. Given to Darl Pikes to scan to chart

## 2023-07-08 NOTE — Telephone Encounter (Signed)
Pt called back and stated the Dexlansoprazole is $100.00 and she cannot afford it. She is asking for you to send in something cheaper

## 2023-07-08 NOTE — Telephone Encounter (Signed)
Pt has been approved for this medication starting 07/07/2023 and ending 06/08/2024. Medication : Dexlansoprazole 60 mg.

## 2023-07-13 ENCOUNTER — Telehealth: Payer: Self-pay | Admitting: Family Medicine

## 2023-07-13 NOTE — Telephone Encounter (Signed)
Pt rescheduling appointment due to scheduling conflict. Forgot had an appointment and did not write on calendar.

## 2023-07-20 ENCOUNTER — Ambulatory Visit: Payer: PPO | Admitting: Family Medicine

## 2023-07-22 DIAGNOSIS — Z299 Encounter for prophylactic measures, unspecified: Secondary | ICD-10-CM | POA: Diagnosis not present

## 2023-07-22 DIAGNOSIS — R6889 Other general symptoms and signs: Secondary | ICD-10-CM | POA: Diagnosis not present

## 2023-07-22 DIAGNOSIS — J101 Influenza due to other identified influenza virus with other respiratory manifestations: Secondary | ICD-10-CM | POA: Diagnosis not present

## 2023-07-27 DIAGNOSIS — J101 Influenza due to other identified influenza virus with other respiratory manifestations: Secondary | ICD-10-CM | POA: Diagnosis not present

## 2023-07-27 DIAGNOSIS — Z299 Encounter for prophylactic measures, unspecified: Secondary | ICD-10-CM | POA: Diagnosis not present

## 2023-07-27 DIAGNOSIS — J209 Acute bronchitis, unspecified: Secondary | ICD-10-CM | POA: Diagnosis not present

## 2023-07-27 DIAGNOSIS — J440 Chronic obstructive pulmonary disease with acute lower respiratory infection: Secondary | ICD-10-CM | POA: Diagnosis not present

## 2023-07-31 DIAGNOSIS — E1165 Type 2 diabetes mellitus with hyperglycemia: Secondary | ICD-10-CM | POA: Diagnosis not present

## 2023-07-31 DIAGNOSIS — J441 Chronic obstructive pulmonary disease with (acute) exacerbation: Secondary | ICD-10-CM | POA: Diagnosis not present

## 2023-07-31 DIAGNOSIS — Z299 Encounter for prophylactic measures, unspecified: Secondary | ICD-10-CM | POA: Diagnosis not present

## 2023-08-03 DIAGNOSIS — R0602 Shortness of breath: Secondary | ICD-10-CM | POA: Diagnosis not present

## 2023-08-03 DIAGNOSIS — R053 Chronic cough: Secondary | ICD-10-CM | POA: Diagnosis not present

## 2023-08-03 DIAGNOSIS — J4550 Severe persistent asthma, uncomplicated: Secondary | ICD-10-CM | POA: Diagnosis not present

## 2023-08-03 DIAGNOSIS — K219 Gastro-esophageal reflux disease without esophagitis: Secondary | ICD-10-CM | POA: Diagnosis not present

## 2023-08-04 ENCOUNTER — Other Ambulatory Visit: Payer: Self-pay

## 2023-08-04 MED ORDER — TOPIRAMATE 50 MG PO TABS: 50.0000 mg | ORAL_TABLET | Freq: Two times a day (BID) | ORAL | 3 refills | Status: AC

## 2023-08-05 DIAGNOSIS — J479 Bronchiectasis, uncomplicated: Secondary | ICD-10-CM | POA: Diagnosis not present

## 2023-08-05 DIAGNOSIS — R911 Solitary pulmonary nodule: Secondary | ICD-10-CM | POA: Diagnosis not present

## 2023-08-10 ENCOUNTER — Other Ambulatory Visit: Payer: Self-pay | Admitting: *Deleted

## 2023-08-10 MED ORDER — PRIMIDONE 50 MG PO TABS: 50.0000 mg | ORAL_TABLET | Freq: Three times a day (TID) | ORAL | 1 refills | Status: DC

## 2023-08-11 DIAGNOSIS — J301 Allergic rhinitis due to pollen: Secondary | ICD-10-CM | POA: Diagnosis not present

## 2023-08-11 DIAGNOSIS — R911 Solitary pulmonary nodule: Secondary | ICD-10-CM | POA: Diagnosis not present

## 2023-08-11 DIAGNOSIS — J4550 Severe persistent asthma, uncomplicated: Secondary | ICD-10-CM | POA: Diagnosis not present

## 2023-08-31 ENCOUNTER — Telehealth: Payer: Self-pay | Admitting: Family Medicine

## 2023-08-31 NOTE — Telephone Encounter (Signed)
 LVM and sent mychart msg informing pt of need to reschedule 01/14/24 appt - NP out

## 2023-09-02 ENCOUNTER — Ambulatory Visit (INDEPENDENT_AMBULATORY_CARE_PROVIDER_SITE_OTHER): Payer: PPO | Admitting: Internal Medicine

## 2023-09-02 ENCOUNTER — Encounter: Payer: Self-pay | Admitting: Internal Medicine

## 2023-09-02 VITALS — BP 117/77 | HR 86 | Temp 98.1°F | Ht 64.5 in | Wt 174.1 lb

## 2023-09-02 DIAGNOSIS — G8929 Other chronic pain: Secondary | ICD-10-CM

## 2023-09-02 DIAGNOSIS — K219 Gastro-esophageal reflux disease without esophagitis: Secondary | ICD-10-CM | POA: Diagnosis not present

## 2023-09-02 DIAGNOSIS — R1013 Epigastric pain: Secondary | ICD-10-CM

## 2023-09-02 DIAGNOSIS — K581 Irritable bowel syndrome with constipation: Secondary | ICD-10-CM | POA: Diagnosis not present

## 2023-09-02 MED ORDER — PANTOPRAZOLE SODIUM 40 MG PO TBEC: 40.0000 mg | DELAYED_RELEASE_TABLET | Freq: Two times a day (BID) | ORAL | 11 refills | Status: AC

## 2023-09-02 NOTE — Patient Instructions (Signed)
 I am going to give you samples of Linzess 290 mcg daily.  Let me know in a week or so how you are doing I can send in formal prescription if improved.  You may need to take stool softener on top of this, but hopefully we can decrease the amount you are taking daily.  Continue on pantoprazole.  I sent in a new prescription to your pharmacy for twice a day dosing.  It was very nice seeing you again today.  Dr. Marletta Lor

## 2023-09-02 NOTE — Progress Notes (Signed)
 Primary Care Physician:  Ignatius Specking, MD Primary Gastroenterologist:  Dr. Marletta Lor  Chief Complaint  Patient presents with   post procedure follow up    Follow up post procedure follow up. States amitiza did not help and she 6 stools softners with laxatives a day.  States she is taking pantoprazole 40mg  daily and famotidine as needed. Dexilant was too expensive and started back on pantoprazole.     HPI:   Nancy [name] is a 58 y.o. female who presents to clinic today for follow-up visit.  Chronic GERD:  Recent worsening of symptoms with epigastric pain and burning.  Also breakthrough symptoms of acid reflux.  Notes she aspirates acid at times which flares her asthma and bronchitis.  History of chronic asthma as well as allergic rhinitis. Status post cholecystectomy.  Currently taking pantoprazole daily, Carafate 4 times daily, Pepcid as needed.  No dysphagia odynophagia.  EGD 07/07/2023 normal Z-line, gastritis, normal duodenum.  Gastric biopsies negative for H. pylori.  Today, states the burning is improved.  Abdominal pain improved.  Irritable bowel syndrome with constipation: Chronic issue, currently taking 6 stool softeners daily.  3 in the AM, 3 in the PM.  States her bowels not well-controlled.  Will have days of constipation and days where she has 2-3 bowel movements.  No melena hematochezia.    Stool softeners in combination of Senokot Dulcolax.  Colonoscopy 12/12/2020 for screening purposes with sigmoid diverticulosis, melanosis coli, hemorrhoids, hypertrophied anal papillae.  10-year recall.  Sent in a prescription for Amitiza 24 mcg twice daily on previous visit, did not help.  Past Medical History:  Diagnosis Date   Allergic rhinitis    Asthma    Benign familial tremor    Bipolar 1 disorder (HCC)    Chronic neck and back pain    Degenerative disk disease    Depression    DJD (degenerative joint disease), lumbar    Dyslipidemia    Fibromyalgia    GERD  (gastroesophageal reflux disease)    Hiatal hernia    History of kidney stones    Irritable bowel syndrome (IBS)    Memory disorder 07/18/2014   Memory loss    Migraine headache    Nephrolithiasis    Pneumonia 1986   Serotonin syndrome    Tremor    hands,arms    Past Surgical History:  Procedure Laterality Date   APPENDECTOMY     back fusion     BACK SURGERY     BIOPSY  07/07/2023   Procedure: BIOPSY;  Surgeon: Lanelle Bal, DO;  [facility];  Service: Endoscopy;;   BRONCHOSCOPY     S. aureus from BAL   CARDIAC CATHETERIZATION  11/27/2006   minimal CAD   CHOLECYSTECTOMY     COLONOSCOPY WITH PROPOFOL N/A 12/12/2020   Procedure: COLONOSCOPY WITH PROPOFOL;  Surgeon: Malissa Hippo, MD;  Location: [facility];  Service: Endoscopy;  Laterality: N/A;  820   ESOPHAGEAL DILATION  08/06/2004   ESOPHAGOGASTRODUODENOSCOPY (EGD) WITH PROPOFOL N/A 07/07/2023   Procedure: ESOPHAGOGASTRODUODENOSCOPY (EGD) WITH PROPOFOL;  Surgeon: Lanelle Bal, DO;  [facility]: [facility];  Service: Endoscopy;  Laterality: N/A;  200pm, asa 2   KNEE ARTHROSCOPY WITH MEDIAL MENISECTOMY Left 08/09/2021   Procedure: KNEE ARTHROSCOPY WITH PARTIAL MEDIAL MENISCECTOMY;  Surgeon: Vickki Hearing, MD;  Location: [facility];  Service: Orthopedics;  Laterality: Left;   LAPAROSCOPIC CHOLECYSTECTOMY     LASER ABLATION OF THE CERVIX  MICRODISSECTION L5-S1  08/13/2000   neck fusion     TONSILLECTOMY      Current Outpatient Medications  Medication Sig Dispense Refill   albuterol (PROVENTIL) (2.5 MG/3ML) 0.083% nebulizer solution Take 2.5 mg by nebulization every 6 (six) hours as needed for shortness of breath or wheezing.     Ascorbic Acid (VITAMIN C) 1000 MG tablet Take 1,000 mg by mouth daily.     atorvastatin (LIPITOR) 10 MG tablet Take 10 mg by mouth daily.     BLACK ELDERBERRY PO Take 1 each by mouth daily. Elderberry gummy     BREZTRI AEROSPHERE 160-9-4.8 MCG/ACT AERO INHALE 2 PUFFS  INTO THE LUNGS IN THE MORNING AND AT BEDTIME. 10.7 each 3   carbamazepine (TEGRETOL) 100 MG chewable tablet Chew 300 mg by mouth at bedtime.     cetirizine (ZYRTEC) 10 MG tablet Take 10 mg by mouth daily.     Cholecalciferol (VITAMIN D3) 50 MCG (2000 UT) capsule Take 2,000 Units by mouth daily.     Cyanocobalamin (VITAMIN B-12) 2500 MCG SUBL Place 2,500 mcg under the tongue daily.     cyclobenzaprine (FLEXERIL) 10 MG tablet Take 10 mg by mouth at bedtime.     dexlansoprazole (DEXILANT) 60 MG capsule Take 1 capsule (60 mg total) by mouth daily. 30 capsule 11   docusate sodium (COLACE) 50 MG capsule Take 150 mg by mouth 2 (two) times daily.     EPINEPHrine 0.3 mg/0.3 mL IJ SOAJ injection Inject 0.3 mg into the muscle as needed for anaphylaxis.     estradiol (ESTRACE) 0.1 MG/GM vaginal cream Place vaginally.     famotidine (PEPCID) 20 MG tablet Take 0.5 tablets (10 mg total) by mouth 2 (two) times daily. 20 tablet 0   gabapentin (NEURONTIN) 300 MG capsule Take 300 mg by mouth at bedtime.     lamoTRIgine (LAMICTAL) 150 MG tablet Take 300 mg by mouth at bedtime.     lisinopril (ZESTRIL) 5 MG tablet Take 1 tablet by mouth daily.     meloxicam (MOBIC) 15 MG tablet Take 15 mg by mouth daily.     Mepolizumab (NUCALA) 100 MG/ML SOAJ Inject 1 mL (100 mg total) into the skin every 28 (twenty-eight) days. 1 mL 5   methocarbamol (ROBAXIN) 500 MG tablet Take 500 mg by mouth 2 (two) times daily.     montelukast (SINGULAIR) 10 MG tablet Take 10 mg by mouth at bedtime.     oxyCODONE-acetaminophen (PERCOCET/ROXICET) 5-325 MG tablet Take 1 tablet by mouth every 6 (six) hours as needed for severe pain (pain score 7-10). 8 tablet 0   pantoprazole (PROTONIX) 40 MG tablet Take 40 mg by mouth daily.     primidone (MYSOLINE) 50 MG tablet Take 1 tablet (50 mg total) by mouth 3 (three) times daily. 270 tablet 1   rizatriptan (MAXALT-MLT) 10 MG disintegrating tablet Take 1 tablet (10 mg total) by mouth as needed for  migraine. May repeat in 2 hours if needed 9 tablet 11   topiramate (TOPAMAX) 50 MG tablet Take 1 tablet (50 mg total) by mouth 2 (two) times daily. 180 tablet 3   No current facility-administered medications for this visit.    Allergies as of 09/02/2023 - Review Complete 09/02/2023  Allergen Reaction Noted   Cymbalta [duloxetine hcl] Other (See Comments) 03/03/2011   Divalproex sodium Other (See Comments) 03/04/2011   Effexor [venlafaxine hydrochloride] Other (See Comments) 03/04/2011   Geodon [ziprasidone hydrochloride] Other (See Comments) 03/04/2011   Prozac [fluoxetine hcl]  Other (See Comments) 03/03/2011   Thorazine [chlorpromazine hcl] Other (See Comments) 03/04/2011   Cephalexin Rash 03/04/2011   Cephalosporins Rash 03/03/2011   Codeine Nausea Only 10/27/2016   Morphine Nausea Only 11/17/2018   Theo-dur [theophylline] Palpitations 03/04/2011   Ziprasidone Nausea Only and Other (See Comments) 03/04/2011    Family History  Problem Relation Age of Onset   Allergies Mother    Clotting disorder Mother    Diabetes Mother    Allergies Father    Heart disease Father    Heart attack Father    Diabetes Father    Allergies Brother    Chorea Maternal Grandmother     Social History   Socioeconomic History   Marital status: Married    Spouse name: Not on file   Number of children: 1   Years of education: HS   Highest education level: Not on file  Occupational History   Occupation: disabled    Employer: UNEMPLOYED  Tobacco Use   Smoking status: Former    Current packs/day: 0.00    Average packs/day: 0.5 packs/day for 8.0 years (4.0 ttl pk-yrs)    Types: Cigarettes    Start date: 06/09/1970    Quit date: 06/09/1978    Years since quitting: 45.2   Smokeless tobacco: Never  Vaping Use   Vaping status: Never Used  Substance and Sexual Activity   Alcohol use: No   Drug use: No   Sexual activity: Not on file  Other Topics Concern   Not on file  Social History Narrative    Patient is right handed.   Patient does not drink caffeine.   Social Drivers of Corporate investment banker Strain: Not on file  Food Insecurity: Low Risk  (08/11/2023)   Received from [facility]   Hunger Vital Sign    Worried About Running Out of Food in the Last Year: Never true    Ran Out of Food in the Last Year: Never true  Transportation Needs: No Transportation Needs (08/11/2023)   Received from [facility]    In the past 12 months, has lack of reliable transportation kept you from medical appointments, meetings, work or from getting things needed for daily living? : No  Physical Activity: Not on file  Stress: Not on file  Social Connections: Not on file  Intimate Partner Violence: Not At Risk (12/16/2022)   Received from [facility]   Humiliation, Afraid, Rape, and Kick questionnaire    Fear of Current or Ex-Partner: No    Emotionally Abused: No    Physically Abused: No    Sexually Abused: No    Subjective: Review of Systems  Constitutional:  Negative for chills and fever.  HENT:  Negative for congestion and hearing loss.   Eyes:  Negative for blurred vision and double vision.  Respiratory:  Negative for cough and shortness of breath.   Cardiovascular:  Negative for chest pain and palpitations.  Gastrointestinal:  Positive for abdominal pain, constipation and heartburn. Negative for blood in stool, diarrhea, melena and vomiting.  Genitourinary:  Negative for dysuria and urgency.  Musculoskeletal:  Negative for joint pain and myalgias.  Skin:  Negative for itching and rash.  Neurological:  Negative for dizziness and headaches.  Psychiatric/Behavioral:  Negative for depression. The patient is not nervous/anxious.        Objective: BP 117/77   Pulse 86   Temp 98.1 F (36.7 C) (Oral)   Ht 5' 4.5" (1.638 m)  Wt 174 lb 1.6 oz (79 kg)   LMP 11/28/2013 Comment: IUD  BMI 29.42 kg/m  Physical Exam Constitutional:      Appearance: Normal  appearance.  HENT:     Head: Normocephalic and atraumatic.  Eyes:     Extraocular Movements: Extraocular movements intact.     Conjunctiva/sclera: Conjunctivae normal.  Cardiovascular:     Rate and Rhythm: Normal rate and regular rhythm.  Pulmonary:     Effort: Pulmonary effort is normal.     Breath sounds: Normal breath sounds.  Abdominal:     General: Bowel sounds are normal.     Palpations: Abdomen is soft.  Musculoskeletal:        General: No swelling. Normal range of motion.     Cervical back: Normal range of motion and neck supple.  Skin:    General: Skin is warm and dry.     Coloration: Skin is not jaundiced.  Neurological:     General: No focal deficit present.     Mental Status: She is alert and oriented to person, place, and time.  Psychiatric:        Mood and Affect: Mood normal.        Behavior: Behavior normal.      Assessment/Plan:  1.  Chronic GERD, epigastric/chest pain-improved, continue on pantoprazole twice daily.  New prescription sent to pharmacy today.  2.  Irritable bowel syndrome with constipation-currently taking 6 stool softeners without adequate relief.  Amitiza did not help.  Will give samples of Linzess 290 mcg daily.  Counseled on initial washout.  She understands.  Call with update and I can send in formal prescription if improved.  Follow-up in 3 months.   09/02/2023 3:04 PM   Disclaimer: This note was dictated with voice recognition software. Similar sounding words can inadvertently be transcribed and may not be corrected upon review.

## 2023-09-09 DIAGNOSIS — F439 Reaction to severe stress, unspecified: Secondary | ICD-10-CM | POA: Diagnosis not present

## 2023-09-09 DIAGNOSIS — R52 Pain, unspecified: Secondary | ICD-10-CM | POA: Diagnosis not present

## 2023-09-09 DIAGNOSIS — M797 Fibromyalgia: Secondary | ICD-10-CM | POA: Diagnosis not present

## 2023-09-09 DIAGNOSIS — G4700 Insomnia, unspecified: Secondary | ICD-10-CM | POA: Diagnosis not present

## 2023-09-09 DIAGNOSIS — Z299 Encounter for prophylactic measures, unspecified: Secondary | ICD-10-CM | POA: Diagnosis not present

## 2023-09-27 ENCOUNTER — Emergency Department (HOSPITAL_COMMUNITY)
Admission: EM | Admit: 2023-09-27 | Discharge: 2023-09-27 | Disposition: A | Discharge status: Home / Self Care | Attending: Emergency Medicine | Admitting: Emergency Medicine

## 2023-09-27 ENCOUNTER — Encounter (HOSPITAL_COMMUNITY): Payer: Self-pay | Admitting: Emergency Medicine

## 2023-09-27 ENCOUNTER — Other Ambulatory Visit: Payer: Self-pay

## 2023-09-27 ENCOUNTER — Emergency Department (HOSPITAL_COMMUNITY)

## 2023-09-27 DIAGNOSIS — K529 Noninfective gastroenteritis and colitis, unspecified: Secondary | ICD-10-CM | POA: Diagnosis not present

## 2023-09-27 DIAGNOSIS — R109 Unspecified abdominal pain: Secondary | ICD-10-CM | POA: Diagnosis not present

## 2023-09-27 DIAGNOSIS — R000 Tachycardia, unspecified: Secondary | ICD-10-CM | POA: Diagnosis not present

## 2023-09-27 DIAGNOSIS — N200 Calculus of kidney: Secondary | ICD-10-CM | POA: Diagnosis not present

## 2023-09-27 LAB — COMPREHENSIVE METABOLIC PANEL WITH GFR
ALT: 43 U/L (ref 0–44)
AST: 40 U/L (ref 15–41)
Albumin: 4 g/dL (ref 3.5–5.0)
Alkaline Phosphatase: 115 U/L (ref 38–126)
Anion gap: 11 (ref 5–15)
BUN: 21 mg/dL — ABNORMAL HIGH (ref 6–20)
CO2: 19 mmol/L — ABNORMAL LOW (ref 22–32)
Calcium: 8.7 mg/dL — ABNORMAL LOW (ref 8.9–10.3)
Chloride: 107 mmol/L (ref 98–111)
Creatinine, Ser: 0.65 mg/dL (ref 0.44–1.00)
GFR, Estimated: >60 mL/min (ref >60)
Glucose, Bld: 175 mg/dL — ABNORMAL HIGH (ref 70–99)
Potassium: 3.2 mmol/L — ABNORMAL LOW (ref 3.5–5.1)
Sodium: 137 mmol/L (ref 135–145)
Total Bilirubin: 0.5 mg/dL (ref 0.0–1.2)
Total Protein: 7.3 g/dL (ref 6.5–8.1)

## 2023-09-27 LAB — URINALYSIS, ROUTINE W REFLEX MICROSCOPIC
Bilirubin Urine: NEGATIVE
Glucose, UA: NEGATIVE mg/dL
Hgb urine dipstick: NEGATIVE
Ketones, ur: NEGATIVE mg/dL
Leukocytes,Ua: NEGATIVE
Nitrite: NEGATIVE
Protein, ur: NEGATIVE mg/dL
Specific Gravity, Urine: >1.046 — ABNORMAL HIGH (ref 1.005–1.030)
pH: 6 (ref 5.0–8.0)

## 2023-09-27 LAB — CBC
HCT: 44 % (ref 36.0–46.0)
Hemoglobin: 15 g/dL (ref 12.0–15.0)
MCH: 31.4 pg (ref 26.0–34.0)
MCHC: 34.1 g/dL (ref 30.0–36.0)
MCV: 92.1 fL (ref 80.0–100.0)
Platelets: 243 10*3/uL (ref 150–400)
RBC: 4.78 MIL/uL (ref 3.87–5.11)
RDW: 13.8 % (ref 11.5–15.5)
WBC: 7.7 10*3/uL (ref 4.0–10.5)
nRBC: 0 % (ref 0.0–0.2)

## 2023-09-27 LAB — LIPASE, BLOOD: Lipase: 34 U/L (ref 11–51)

## 2023-09-27 MED ORDER — IOHEXOL 300 MG/ML  SOLN
100.0000 mL | Freq: Once | INTRAMUSCULAR | Status: AC | PRN
  Administered 2023-09-27: 100 mL via INTRAVENOUS

## 2023-09-27 MED ORDER — POTASSIUM CHLORIDE 10 MEQ/100ML IV SOLN
10.0000 meq | Freq: Once | INTRAVENOUS | Status: AC
  Administered 2023-09-27: 10 meq via INTRAVENOUS
  Filled 2023-09-27: qty 100

## 2023-09-27 MED ORDER — ONDANSETRON HCL 4 MG/2ML IJ SOLN
4.0000 mg | Freq: Once | INTRAMUSCULAR | Status: AC
  Administered 2023-09-27: 4 mg via INTRAVENOUS
  Filled 2023-09-27: qty 2

## 2023-09-27 MED ORDER — KETOROLAC TROMETHAMINE 30 MG/ML IJ SOLN
30.0000 mg | Freq: Once | INTRAMUSCULAR | Status: AC
  Administered 2023-09-27: 30 mg via INTRAVENOUS
  Filled 2023-09-27: qty 1

## 2023-09-27 MED ORDER — CIPROFLOXACIN HCL 500 MG PO TABS: 500.0000 mg | ORAL_TABLET | Freq: Two times a day (BID) | ORAL | 0 refills | Status: AC

## 2023-09-27 MED ORDER — ONDANSETRON 4 MG PO TBDP: ORAL_TABLET | ORAL | 0 refills | Status: AC

## 2023-09-27 MED ORDER — SODIUM CHLORIDE 0.9 % IV BOLUS
2000.0000 mL | Freq: Once | INTRAVENOUS | Status: AC
  Administered 2023-09-27: 2000 mL via INTRAVENOUS

## 2023-09-27 MED ORDER — PANTOPRAZOLE SODIUM 40 MG IV SOLR
40.0000 mg | Freq: Once | INTRAVENOUS | Status: AC
  Administered 2023-09-27: 40 mg via INTRAVENOUS
  Filled 2023-09-27: qty 10

## 2023-09-27 MED ORDER — DICYCLOMINE HCL 20 MG PO TABS: ORAL_TABLET | ORAL | 0 refills | Status: AC

## 2023-09-27 NOTE — ED Notes (Signed)
 Pt stating potassium burning , rate decreased to 75meq .

## 2023-09-27 NOTE — Discharge Instructions (Signed)
Drink plenty of fluids and follow-up with your family doctor this week 

## 2023-09-27 NOTE — ED Provider Notes (Signed)
 [place] EMERGENCY DEPARTMENT AT [facility] Provider Note   CSN: [id number] Arrival date & time: 09/27/23  7829     History {Add pertinent medical, surgical, social history, OB history to HPI:1} Chief Complaint  Patient presents with   Abdominal Pain   Diarrhea    Nancy [name] is a 58 y.o. female.  Patient complains of vomiting and diarrhea.  She was recently at the [place]   Abdominal Pain Associated symptoms: diarrhea   Diarrhea Associated symptoms: abdominal pain        Home Medications Prior to Admission medications   Medication Sig Start Date End Date Taking? Authorizing Provider  ciprofloxacin  (CIPRO ) 500 MG tablet Take 1 tablet (500 mg total) by mouth 2 (two) times daily. One po bid x 7 days 09/27/23  Yes Syleena Mchan, MD  dicyclomine  (BENTYL ) 20 MG tablet Take 1 every 8 hours as needed for abdominal cramps 09/27/23  Yes Arien Benincasa, MD  ondansetron  (ZOFRAN -ODT) 4 MG disintegrating tablet 4mg  ODT q4 hours prn nausea/vomit 09/27/23  Yes Maddex Garlitz, MD  albuterol  (PROVENTIL ) (2.5 MG/3ML) 0.083% nebulizer solution Take 2.5 mg by nebulization every 6 (six) hours as needed for shortness of breath or wheezing.    [provider]  Ascorbic Acid (VITAMIN C) 1000 MG tablet Take 1,000 mg by mouth daily.    [provider]  atorvastatin (LIPITOR) 10 MG tablet Take 10 mg by mouth daily.    [provider]  BLACK ELDERBERRY PO Take 1 each by mouth daily. Elderberry gummy    [provider]  BREZTRI  AEROSPHERE 160-9-4.8 MCG/ACT AERO INHALE 2 PUFFS INTO THE LUNGS IN THE MORNING AND AT BEDTIME. 08/28/22   Sood, Vineet, MD  carbamazepine  (TEGRETOL ) 100 MG chewable tablet Chew 300 mg by mouth at bedtime. 11/23/20   [provider]  cetirizine (ZYRTEC) 10 MG tablet Take 10 mg by mouth daily.    [provider]  Cholecalciferol (VITAMIN D3) 50 MCG (2000 UT) capsule Take 2,000 Units by mouth daily.     [provider]  Cyanocobalamin (VITAMIN B-12) 2500 MCG SUBL Place 2,500 mcg under the tongue daily.    [provider]  cyclobenzaprine (FLEXERIL) 10 MG tablet Take 10 mg by mouth at bedtime.    [provider]  docusate sodium (COLACE) 50 MG capsule Take 150 mg by mouth 2 (two) times daily.    [provider]  EPINEPHrine  0.3 mg/0.3 mL IJ SOAJ injection Inject 0.3 mg into the muscle as needed for anaphylaxis.    [provider]  estradiol (ESTRACE) 0.1 MG/GM vaginal cream Place vaginally. 11/12/22   [provider]  famotidine  (PEPCID ) 20 MG tablet Take 0.5 tablets (10 mg total) by mouth 2 (two) times daily. 06/23/23   Mandy Second, PA-C  gabapentin (NEURONTIN) 300 MG capsule Take 300 mg by mouth at bedtime.    [provider]  lamoTRIgine (LAMICTAL) 150 MG tablet Take 300 mg by mouth at bedtime.    [provider]  lisinopril (ZESTRIL) 5 MG tablet Take 1 tablet by mouth daily. 10/01/22   [provider]  meloxicam (MOBIC) 15 MG tablet Take 15 mg by mouth daily. 05/19/20   [provider]  Mepolizumab  (NUCALA ) 100 MG/ML SOAJ Inject 1 mL (100 mg total) into the skin every 28 (twenty-eight) days. 02/05/23   Sood, Vineet, MD  methocarbamol (ROBAXIN) 500 MG tablet Take 500 mg by mouth 2 (two) times daily. 10/19/22   [provider]  montelukast (SINGULAIR) 10 MG tablet Take 10 mg by mouth at bedtime.    [provider]  oxyCODONE -acetaminophen  (PERCOCET/ROXICET) 5-325 MG tablet Take 1 tablet by mouth every 6 (six) hours as needed for severe pain (pain score 7-10). 06/23/23   Mandy Second, PA-C  pantoprazole  (PROTONIX ) 40 MG tablet Take 1 tablet (40 mg total) by mouth 2 (two) times daily. 09/02/23 09/01/24  Vinetta Greening, DO  primidone  (MYSOLINE ) 50 MG tablet Take 1 tablet (50 mg total) by mouth 3 (three) times daily. 08/10/23   Lomax, Amy, NP  rizatriptan  (MAXALT -MLT) 10 MG disintegrating tablet  Take 1 tablet (10 mg total) by mouth as needed for migraine. May repeat in 2 hours if needed 01/14/22   Penumalli, Vikram R, MD  topiramate  (TOPAMAX ) 50 MG tablet Take 1 tablet (50 mg total) by mouth 2 (two) times daily. 08/04/23   Lomax, Amy, NP      Allergies    Cymbalta [duloxetine hcl], Divalproex sodium, Effexor [venlafaxine hydrochloride], Geodon [ziprasidone hydrochloride], Prozac [fluoxetine hcl], Thorazine [chlorpromazine hcl], Cephalexin, Cephalosporins, Codeine, Morphine , Theo-dur [theophylline], and Ziprasidone    Review of Systems   Review of Systems  Gastrointestinal:  Positive for abdominal pain and diarrhea.    Physical Exam Updated Vital Signs BP 101/64   Pulse 84   Temp 99.7 F (37.6 C) (Oral)   Resp 18   Ht 5' 4.5" (1.638 m)   Wt 76.2 kg   LMP 11/28/2013 Comment: IUD  SpO2 98%   BMI 28.39 kg/m  Physical Exam  ED Results / Procedures / Treatments   Labs (all labs ordered are listed, but only abnormal results are displayed) Labs Reviewed  COMPREHENSIVE METABOLIC PANEL WITH GFR - Abnormal; Notable for the following components:      Result Value   Potassium 3.2 (*)    CO2 19 (*)    Glucose, Bld 175 (*)    BUN 21 (*)    Calcium 8.7 (*)    All other components within normal limits  URINALYSIS, ROUTINE W REFLEX MICROSCOPIC - Abnormal; Notable for the following components:   Color, Urine STRAW (*)    Specific Gravity, Urine >1.046 (*)    All other components within normal limits  GASTROINTESTINAL PANEL BY PCR, STOOL (REPLACES STOOL CULTURE)  LIPASE, BLOOD  CBC    EKG EKG Interpretation Date/Time:  Sunday September 27 2023 06:41:10 EDT Ventricular Rate:  113 PR Interval:  141 QRS Duration:  99 QT Interval:  330 QTC Calculation: 453 R Axis:   34  Text Interpretation: Sinus tachycardia Consider anterior infarct Borderline T abnormalities, inferior leads No significant change since 06/22/2023 Confirmed by Orvilla Blander ([id number]) on 09/27/2023 6:43:55  AM  Radiology CT ABDOMEN PELVIS W CONTRAST Result Date: 09/27/2023 CLINICAL DATA:  58 year old female with abdominal pain, diarrhea, recent international travel. EXAM: CT ABDOMEN AND PELVIS WITH CONTRAST TECHNIQUE: Multidetector CT imaging of the abdomen and pelvis was performed using the standard protocol following bolus administration of intravenous contrast. RADIATION DOSE REDUCTION: This exam was performed according to the departmental dose-optimization program which includes automated exposure control, adjustment of the mA and/or kV according to patient size and/or use of iterative reconstruction technique. CONTRAST:  OMNIPAQUE  IOHEXOL  300 MG/ML  SOLN COMPARISON:  None Available. FINDINGS: Lower chest: Curvilinear bilateral lung base opacity most resembles atelectasis or scarring. Normal heart size. No pericardial or pleural effusion. Hepatobiliary: Diminutive or absent gallbladder. Negative liver. No bile duct enlargement. Pancreas: Partially atrophied. Spleen: Negative. Adrenals/Urinary Tract: Normal adrenal glands.  Nonobstructed kidneys. Right renal upper pole nephrolithiasis, stones individually up to 4 mm. Symmetric renal enhancement and contrast excretion 2 diminutive ureters. Unremarkable bladder. Stomach/Bowel: Decompressed large bowel from the splenic flexure distally with mild fluid throughout the large bowel lumen. Evidence of previous appendectomy on series 2, image 65. Nondilated but fluid-filled small bowel throughout the abdomen. Decompressed stomach and duodenum. No pneumoperitoneum. No free fluid or focal mesenteric inflammation identified. Vascular/Lymphatic: Faint Calcified aortic atherosclerosis. Major arterial structures, portal venous system are patent. No lymphadenopathy. Reproductive: Negative. Other: No pelvis free fluid. Musculoskeletal: Multilevel previous lumbar decompression and fusion. No acute osseous abnormality identified. IMPRESSION: 1. Fluid-filled throughout  nondilated small and large bowel compatible with Enteritis/Diarrhea. 2. No other acute or inflammatory process identified in the abdomen or pelvis. Right nephrolithiasis without obstructive uropathy. Previous appendectomy. Previous lumbar decompression and fusion. Electronically Signed   By: Marlise Simpers M.D.   On: 09/27/2023 10:26    Procedures Procedures  {Document cardiac monitor, telemetry assessment procedure when appropriate:1}  Medications Ordered in ED Medications  sodium chloride  0.9 % bolus 2,000 mL (0 mLs Intravenous Stopped 09/27/23 1053)  ketorolac  (TORADOL ) 30 MG/ML injection 30 mg (30 mg Intravenous Given 09/27/23 0756)  pantoprazole  (PROTONIX ) injection 40 mg (40 mg Intravenous Given 09/27/23 0753)  ondansetron  (ZOFRAN ) injection 4 mg (4 mg Intravenous Given 09/27/23 0755)  potassium chloride  10 mEq in 100 mL IVPB (0 mEq Intravenous Stopped 09/27/23 0920)  iohexol  (OMNIPAQUE ) 300 MG/ML solution 100 mL (100 mLs Intravenous Contrast Given 09/27/23 1001)    ED Course/ Medical Decision Making/ A&P   {   Click here for ABCD2, HEART and other calculatorsREFRESH Note before signing :1}                              Medical Decision Making Amount and/or Complexity of Data Reviewed Labs: ordered. Radiology: ordered.  Risk Prescription drug management.   Gastroenteritis.  Patient is given Zofran  Bentyl  and Cipro  and will follow-up with her PCP  {Document critical care time when appropriate:1} {Document review of labs and clinical decision tools ie heart score, Chads2Vasc2 etc:1}  {Document your independent review of radiology images, and any outside records:1} {Document your discussion with family members, caretakers, and with consultants:1} {Document social determinants of health affecting pt's care:1} {Document your decision making why or why not admission, treatments were needed:1} Final Clinical Impression(s) / ED Diagnoses Final diagnoses:  Gastroenteritis    Rx / DC  Orders ED Discharge Orders          Ordered    ciprofloxacin  (CIPRO ) 500 MG tablet  2 times daily        09/27/23 1121    ondansetron  (ZOFRAN -ODT) 4 MG disintegrating tablet        09/27/23 1121    dicyclomine  (BENTYL ) 20 MG tablet        09/27/23 1121

## 2023-09-27 NOTE — ED Notes (Signed)
 Pt went to bathroom and forgot samples. Pt reminded about getting samples.

## 2023-09-27 NOTE — ED Triage Notes (Signed)
 Pt here for c/o upper abdominal pain, bloating, and diarrhea since returning from the [place] on Friday around 2am. Pt states she is unable to eat or drink d/t feeling of fullness/bloating.

## 2023-10-15 ENCOUNTER — Encounter: Payer: Self-pay | Admitting: Internal Medicine

## 2023-10-21 DIAGNOSIS — R52 Pain, unspecified: Secondary | ICD-10-CM | POA: Diagnosis not present

## 2023-10-21 DIAGNOSIS — E1169 Type 2 diabetes mellitus with other specified complication: Secondary | ICD-10-CM | POA: Diagnosis not present

## 2023-10-21 DIAGNOSIS — R5383 Other fatigue: Secondary | ICD-10-CM | POA: Diagnosis not present

## 2023-10-21 DIAGNOSIS — Z299 Encounter for prophylactic measures, unspecified: Secondary | ICD-10-CM | POA: Diagnosis not present

## 2023-11-03 DIAGNOSIS — R5383 Other fatigue: Secondary | ICD-10-CM | POA: Diagnosis not present

## 2023-11-03 DIAGNOSIS — F319 Bipolar disorder, unspecified: Secondary | ICD-10-CM | POA: Diagnosis not present

## 2023-11-03 DIAGNOSIS — Z0000 Encounter for general adult medical examination without abnormal findings: Secondary | ICD-10-CM | POA: Diagnosis not present

## 2023-11-03 DIAGNOSIS — Z7189 Other specified counseling: Secondary | ICD-10-CM | POA: Diagnosis not present

## 2023-11-03 DIAGNOSIS — E7800 Pure hypercholesterolemia, unspecified: Secondary | ICD-10-CM | POA: Diagnosis not present

## 2023-11-03 DIAGNOSIS — Z1339 Encounter for screening examination for other mental health and behavioral disorders: Secondary | ICD-10-CM | POA: Diagnosis not present

## 2023-11-03 DIAGNOSIS — Z79899 Other long term (current) drug therapy: Secondary | ICD-10-CM | POA: Diagnosis not present

## 2023-11-03 DIAGNOSIS — Z299 Encounter for prophylactic measures, unspecified: Secondary | ICD-10-CM | POA: Diagnosis not present

## 2023-11-03 DIAGNOSIS — Z1331 Encounter for screening for depression: Secondary | ICD-10-CM | POA: Diagnosis not present

## 2023-11-12 ENCOUNTER — Other Ambulatory Visit: Payer: Self-pay | Admitting: *Deleted

## 2023-11-12 NOTE — Telephone Encounter (Signed)
 Last seen on 05/11/23 Follow up scheduled on 03/09/24   Dispensed Days Supply Quantity Provider Pharmacy  PRIMIDONE  50 MG TABLET 11/06/2023 90 270 each Lomax, Amy, NP [phone] - D...     Too soon to refill, pt should have enough to last until August.

## 2023-11-18 ENCOUNTER — Other Ambulatory Visit: Payer: Self-pay

## 2023-11-18 ENCOUNTER — Telehealth: Payer: Self-pay | Admitting: Family Medicine

## 2023-11-18 ENCOUNTER — Other Ambulatory Visit: Payer: Self-pay | Admitting: Diagnostic Neuroimaging

## 2023-11-18 DIAGNOSIS — R809 Proteinuria, unspecified: Secondary | ICD-10-CM | POA: Diagnosis not present

## 2023-11-18 DIAGNOSIS — Z9989 Dependence on other enabling machines and devices: Secondary | ICD-10-CM | POA: Diagnosis not present

## 2023-11-18 DIAGNOSIS — F319 Bipolar disorder, unspecified: Secondary | ICD-10-CM | POA: Diagnosis not present

## 2023-11-18 DIAGNOSIS — R42 Dizziness and giddiness: Secondary | ICD-10-CM | POA: Diagnosis not present

## 2023-11-18 DIAGNOSIS — R519 Headache, unspecified: Secondary | ICD-10-CM | POA: Diagnosis not present

## 2023-11-18 DIAGNOSIS — Z299 Encounter for prophylactic measures, unspecified: Secondary | ICD-10-CM | POA: Diagnosis not present

## 2023-11-18 DIAGNOSIS — G4733 Obstructive sleep apnea (adult) (pediatric): Secondary | ICD-10-CM | POA: Diagnosis not present

## 2023-11-18 DIAGNOSIS — E1129 Type 2 diabetes mellitus with other diabetic kidney complication: Secondary | ICD-10-CM | POA: Diagnosis not present

## 2023-11-18 MED ORDER — RIZATRIPTAN BENZOATE 10 MG PO TBDP: ORAL_TABLET | ORAL | 4 refills | Status: AC

## 2023-11-18 NOTE — Telephone Encounter (Signed)
 Last seen on 07/21/22 Follow up scheduled on 03/09/24

## 2023-11-18 NOTE — Addendum Note (Signed)
 Addended by: Fredi January on: 11/18/2023 04:23 PM   Modules accepted: Orders

## 2023-11-18 NOTE — Telephone Encounter (Signed)
  Pt call  in regards to Pharmacy informing her that Medication (rizatriptan  (MAXALT -MLT) 10 MG disintegrating tablet) had been denied . She is not able to pick it up the patient is concern and would like a call back .  Informed Pt that medication  was received 6-11 at 3:49 .    [id number] [place], [place] - [address] Phone: [phone]  Fax: [phone]

## 2023-11-18 NOTE — Telephone Encounter (Signed)
 It appears was approved today, I called [facility] they state they received a denial.  I don't see a denial in chart. I re-sent Rx. I called pt and explained to her that Rx was sent today, but somehow pharmacy saw denial.   Pt aware to call back if any other issues.

## 2023-11-20 DIAGNOSIS — R110 Nausea: Secondary | ICD-10-CM | POA: Diagnosis not present

## 2023-11-20 DIAGNOSIS — Z299 Encounter for prophylactic measures, unspecified: Secondary | ICD-10-CM | POA: Diagnosis not present

## 2023-11-20 DIAGNOSIS — R519 Headache, unspecified: Secondary | ICD-10-CM | POA: Diagnosis not present

## 2023-11-20 DIAGNOSIS — H6990 Unspecified Eustachian tube disorder, unspecified ear: Secondary | ICD-10-CM | POA: Diagnosis not present

## 2023-11-20 DIAGNOSIS — R42 Dizziness and giddiness: Secondary | ICD-10-CM | POA: Diagnosis not present

## 2023-11-23 DIAGNOSIS — Z299 Encounter for prophylactic measures, unspecified: Secondary | ICD-10-CM | POA: Diagnosis not present

## 2023-11-23 DIAGNOSIS — J449 Chronic obstructive pulmonary disease, unspecified: Secondary | ICD-10-CM | POA: Diagnosis not present

## 2023-11-23 DIAGNOSIS — R519 Headache, unspecified: Secondary | ICD-10-CM | POA: Diagnosis not present

## 2023-11-23 DIAGNOSIS — R42 Dizziness and giddiness: Secondary | ICD-10-CM | POA: Diagnosis not present

## 2023-11-23 DIAGNOSIS — F319 Bipolar disorder, unspecified: Secondary | ICD-10-CM | POA: Diagnosis not present

## 2023-11-26 DIAGNOSIS — R42 Dizziness and giddiness: Secondary | ICD-10-CM | POA: Diagnosis not present

## 2023-11-26 DIAGNOSIS — E785 Hyperlipidemia, unspecified: Secondary | ICD-10-CM | POA: Diagnosis not present

## 2023-11-26 DIAGNOSIS — E119 Type 2 diabetes mellitus without complications: Secondary | ICD-10-CM | POA: Diagnosis not present

## 2023-11-26 DIAGNOSIS — Z299 Encounter for prophylactic measures, unspecified: Secondary | ICD-10-CM | POA: Diagnosis not present

## 2023-11-27 DIAGNOSIS — E2839 Other primary ovarian failure: Secondary | ICD-10-CM | POA: Diagnosis not present

## 2023-12-08 DIAGNOSIS — M7742 Metatarsalgia, left foot: Secondary | ICD-10-CM | POA: Diagnosis not present

## 2023-12-08 DIAGNOSIS — L84 Corns and callosities: Secondary | ICD-10-CM | POA: Diagnosis not present

## 2023-12-08 DIAGNOSIS — M79675 Pain in left toe(s): Secondary | ICD-10-CM | POA: Diagnosis not present

## 2023-12-08 DIAGNOSIS — M76822 Posterior tibial tendinitis, left leg: Secondary | ICD-10-CM | POA: Diagnosis not present

## 2023-12-08 DIAGNOSIS — M7741 Metatarsalgia, right foot: Secondary | ICD-10-CM | POA: Diagnosis not present

## 2023-12-16 DIAGNOSIS — Z0000 Encounter for general adult medical examination without abnormal findings: Secondary | ICD-10-CM | POA: Diagnosis not present

## 2023-12-16 DIAGNOSIS — I739 Peripheral vascular disease, unspecified: Secondary | ICD-10-CM | POA: Diagnosis not present

## 2023-12-16 DIAGNOSIS — Z6830 Body mass index (BMI) 30.0-30.9, adult: Secondary | ICD-10-CM | POA: Diagnosis not present

## 2023-12-16 DIAGNOSIS — F319 Bipolar disorder, unspecified: Secondary | ICD-10-CM | POA: Diagnosis not present

## 2023-12-16 DIAGNOSIS — J449 Chronic obstructive pulmonary disease, unspecified: Secondary | ICD-10-CM | POA: Diagnosis not present

## 2023-12-16 DIAGNOSIS — Z299 Encounter for prophylactic measures, unspecified: Secondary | ICD-10-CM | POA: Diagnosis not present

## 2023-12-29 DIAGNOSIS — H524 Presbyopia: Secondary | ICD-10-CM | POA: Diagnosis not present

## 2023-12-29 DIAGNOSIS — H25813 Combined forms of age-related cataract, bilateral: Secondary | ICD-10-CM | POA: Diagnosis not present

## 2023-12-30 ENCOUNTER — Encounter: Payer: Self-pay | Admitting: Internal Medicine

## 2023-12-30 ENCOUNTER — Ambulatory Visit (INDEPENDENT_AMBULATORY_CARE_PROVIDER_SITE_OTHER): Admitting: Internal Medicine

## 2023-12-30 ENCOUNTER — Telehealth: Payer: Self-pay

## 2023-12-30 VITALS — BP 123/73 | HR 80 | Temp 98.1°F | Ht 64.0 in | Wt 173.5 lb

## 2023-12-30 DIAGNOSIS — K581 Irritable bowel syndrome with constipation: Secondary | ICD-10-CM | POA: Diagnosis not present

## 2023-12-30 DIAGNOSIS — G8929 Other chronic pain: Secondary | ICD-10-CM

## 2023-12-30 DIAGNOSIS — R1013 Epigastric pain: Secondary | ICD-10-CM | POA: Diagnosis not present

## 2023-12-30 DIAGNOSIS — K219 Gastro-esophageal reflux disease without esophagitis: Secondary | ICD-10-CM

## 2023-12-30 MED ORDER — LINACLOTIDE 290 MCG PO CAPS: 290.0000 ug | ORAL_CAPSULE | Freq: Every day | ORAL | 3 refills | Status: DC

## 2023-12-30 NOTE — Patient Instructions (Signed)
 I am going to send in formal prescription for Linzess  to your pharmacy.  In the meantime we will go ahead and give you more samples.  If insurance denies this then we will try Motegrity .  Continue on pantoprazole  for your chronic acid reflux.  Follow-up in 3 months or sooner if needed.  It was very nice seeing you again today.  Dr. Cindie

## 2023-12-30 NOTE — Progress Notes (Signed)
 Primary Care Physician:  Rosamond Leta NOVAK, MD Primary Gastroenterologist:  Dr. Cindie  Chief Complaint  Patient presents with   Follow-up    Pt arrives for follow up. Linzess  did work but cost to much and insurance will not cover it.     HPI:   Nancy [name] is a 58 y.o. female who presents to clinic today for follow-up visit.  Chronic GERD:  History of epigastric pain and burning.  Notes she aspirates acid at times which flares her asthma and bronchitis.  History of chronic asthma as well as allergic rhinitis. Status post cholecystectomy.  Currently taking pantoprazole , Pepcid  as needed.  No dysphagia odynophagia.  EGD 07/07/2023 normal Z-line, gastritis, normal duodenum.  Gastric biopsies negative for H. pylori.  Today, states the burning is improved.  Abdominal pain improved.  Irritable bowel syndrome with constipation: Chronic issue, currently taking 6 stool softeners daily.  3 in the AM, 3 in the PM.  States her bowels not well-controlled.  Will have days of constipation and days where she has 2-3 bowel movements.  No melena hematochezia.    Colonoscopy 12/12/2020 for screening purposes with sigmoid diverticulosis, melanosis coli, hemorrhoids, hypertrophied anal papillae.  10-year recall.  Previously trialed on Amitiza  24 mcg twice daily which did not help.  Was given samples of Linzess  290 mcg daily which improved her symptoms immensely.  She is concerned about coverage for this medication however.  Past Medical History:  Diagnosis Date   Allergic rhinitis    Asthma    Benign familial tremor    Bipolar 1 disorder (HCC)    Chronic neck and back pain    Degenerative disk disease    Depression    DJD (degenerative joint disease), lumbar    Dyslipidemia    Fibromyalgia    GERD (gastroesophageal reflux disease)    Hiatal hernia    History of kidney stones    Irritable bowel syndrome (IBS)    Memory disorder 07/18/2014   Memory loss    Migraine headache     Nephrolithiasis    Pneumonia 1986   Serotonin syndrome    Tremor    hands,arms    Past Surgical History:  Procedure Laterality Date   APPENDECTOMY     back fusion     BACK SURGERY     BIOPSY  07/07/2023   Procedure: BIOPSY;  Surgeon: Cindie Carlin POUR, DO;  Location: [facility];  Service: Endoscopy;;   BRONCHOSCOPY     S. aureus from BAL   CARDIAC CATHETERIZATION  11/27/2006   minimal CAD   CHOLECYSTECTOMY     COLONOSCOPY WITH PROPOFOL  N/A 12/12/2020   Procedure: COLONOSCOPY WITH PROPOFOL ;  Surgeon: Golda Claudis PENNER, MD;  Location: [facility];  Service: Endoscopy;  Laterality: N/A;  820   ESOPHAGEAL DILATION  08/06/2004   ESOPHAGOGASTRODUODENOSCOPY (EGD) WITH PROPOFOL  N/A 07/07/2023   Procedure: ESOPHAGOGASTRODUODENOSCOPY (EGD) WITH PROPOFOL ;  Surgeon: Cindie Carlin POUR, DO;  Location: [facility];  Service: Endoscopy;  Laterality: N/A;  200pm, asa 2   KNEE ARTHROSCOPY WITH MEDIAL MENISECTOMY Left 08/09/2021   Procedure: KNEE ARTHROSCOPY WITH PARTIAL MEDIAL MENISCECTOMY;  Surgeon: Margrette Taft BRAVO, MD;  Location: [facility];  Service: Orthopedics;  Laterality: Left;   LAPAROSCOPIC CHOLECYSTECTOMY     LASER ABLATION OF THE CERVIX     MICRODISSECTION L5-S1  08/13/2000   neck fusion     TONSILLECTOMY      Current Outpatient Medications  Medication Sig Dispense Refill  albuterol  (PROVENTIL ) (2.5 MG/3ML) 0.083% nebulizer solution Take 2.5 mg by nebulization every 6 (six) hours as needed for shortness of breath or wheezing.     Ascorbic Acid (VITAMIN C) 1000 MG tablet Take 1,000 mg by mouth daily.     atorvastatin (LIPITOR) 10 MG tablet Take 10 mg by mouth daily.     BLACK ELDERBERRY PO Take 1 each by mouth daily. Elderberry gummy     BREZTRI  AEROSPHERE 160-9-4.8 MCG/ACT AERO INHALE 2 PUFFS INTO THE LUNGS IN THE MORNING AND AT BEDTIME. 10.7 each 3   carbamazepine  (TEGRETOL ) 100 MG chewable tablet Chew 300 mg by mouth at bedtime.     cetirizine (ZYRTEC) 10 MG tablet Take 10 mg  by mouth daily.     Cholecalciferol (VITAMIN D3) 50 MCG (2000 UT) capsule Take 2,000 Units by mouth daily.     ciprofloxacin  (CIPRO ) 500 MG tablet Take 1 tablet (500 mg total) by mouth 2 (two) times daily. One po bid x 7 days 10 tablet 0   Cyanocobalamin (VITAMIN B-12) 2500 MCG SUBL Place 2,500 mcg under the tongue daily.     cyclobenzaprine (FLEXERIL) 10 MG tablet Take 10 mg by mouth at bedtime.     dicyclomine  (BENTYL ) 20 MG tablet Take 1 every 8 hours as needed for abdominal cramps 20 tablet 0   docusate sodium (COLACE) 50 MG capsule Take 150 mg by mouth 2 (two) times daily.     EPINEPHrine  0.3 mg/0.3 mL IJ SOAJ injection Inject 0.3 mg into the muscle as needed for anaphylaxis.     estradiol (ESTRACE) 0.1 MG/GM vaginal cream Place vaginally.     famotidine  (PEPCID ) 20 MG tablet Take 0.5 tablets (10 mg total) by mouth 2 (two) times daily. 20 tablet 0   gabapentin (NEURONTIN) 300 MG capsule Take 300 mg by mouth at bedtime.     lamoTRIgine (LAMICTAL) 150 MG tablet Take 300 mg by mouth at bedtime.     lisinopril (ZESTRIL) 5 MG tablet Take 1 tablet by mouth daily.     meloxicam (MOBIC) 15 MG tablet Take 15 mg by mouth daily.     Mepolizumab  (NUCALA ) 100 MG/ML SOAJ Inject 1 mL (100 mg total) into the skin every 28 (twenty-eight) days. 1 mL 5   methocarbamol (ROBAXIN) 500 MG tablet Take 500 mg by mouth 2 (two) times daily.     montelukast (SINGULAIR) 10 MG tablet Take 10 mg by mouth at bedtime.     ondansetron  (ZOFRAN -ODT) 4 MG disintegrating tablet 4mg  ODT q4 hours prn nausea/vomit 10 tablet 0   oxyCODONE -acetaminophen  (PERCOCET/ROXICET) 5-325 MG tablet Take 1 tablet by mouth every 6 (six) hours as needed for severe pain (pain score 7-10). 8 tablet 0   pantoprazole  (PROTONIX ) 40 MG tablet Take 1 tablet (40 mg total) by mouth 2 (two) times daily. 60 tablet 11   primidone  (MYSOLINE ) 50 MG tablet Take 1 tablet (50 mg total) by mouth 3 (three) times daily. 270 tablet 1   rizatriptan  (MAXALT -MLT) 10 MG  disintegrating tablet TAKE 1 TABLET BY MOUTH AS NEEDED FOR MIGRAINE. MAY REPEAT IN 2 HOURS IF NEEDED 9 tablet 4   topiramate  (TOPAMAX ) 50 MG tablet Take 1 tablet (50 mg total) by mouth 2 (two) times daily. 180 tablet 3   No current facility-administered medications for this visit.    Allergies as of 12/30/2023 - Review Complete 12/30/2023  Allergen Reaction Noted   Cymbalta [duloxetine hcl] Other (See Comments) 03/03/2011   Divalproex sodium Other (See Comments) 03/04/2011  Effexor [venlafaxine hydrochloride] Other (See Comments) 03/04/2011   Geodon [ziprasidone hydrochloride] Other (See Comments) 03/04/2011   Prozac [fluoxetine hcl] Other (See Comments) 03/03/2011   Thorazine [chlorpromazine hcl] Other (See Comments) 03/04/2011   Cephalexin Rash 03/04/2011   Cephalosporins Rash 03/03/2011   Codeine Nausea Only 10/27/2016   Morphine  Nausea Only 11/17/2018   Theo-dur [theophylline] Palpitations 03/04/2011   Ziprasidone Nausea Only and Other (See Comments) 03/04/2011    Family History  Problem Relation Age of Onset   Allergies Mother    Clotting disorder Mother    Diabetes Mother    Allergies Father    Heart disease Father    Heart attack Father    Diabetes Father    Allergies Brother    Chorea Maternal Grandmother     Social History   Socioeconomic History   Marital status: Married    Spouse name: Not on file   Number of children: 1   Years of education: HS   Highest education level: Not on file  Occupational History   Occupation: disabled    Employer: UNEMPLOYED  Tobacco Use   Smoking status: Former    Current packs/day: 0.00    Average packs/day: 0.5 packs/day for 8.0 years (4.0 ttl pk-yrs)    Types: Cigarettes    Start date: 06/09/1970    Quit date: 06/09/1978    Years since quitting: 45.5   Smokeless tobacco: Never  Vaping Use   Vaping status: Never Used  Substance and Sexual Activity   Alcohol  use: No   Drug use: No   Sexual activity: Not on file  Other  Topics Concern   Not on file  Social History Narrative   Patient is right handed.   Patient does not drink caffeine.   Social Drivers of Corporate investment banker Strain: Not on file  Food Insecurity: Low Risk  (08/11/2023)   Received from [facility]   Hunger Vital Sign    Within the past 12 months, you worried that your food would run out before you got money to buy more: Never true    Within the past 12 months, the food you bought just didn't last and you didn't have money to get more. : Never true  Transportation Needs: No Transportation Needs (08/11/2023)   Received from [facility]    In the past 12 months, has lack of reliable transportation kept you from medical appointments, meetings, work or from getting things needed for daily living? : No  Physical Activity: Not on file  Stress: Not on file  Social Connections: Not on file  Intimate Partner Violence: Not At Risk (12/16/2022)   [facility] from [facility]   Humiliation, Afraid, Rape, and Kick questionnaire    Within the last year, have you been afraid of your partner or ex-partner?: No    Within the last year, have you been humiliated or emotionally abused in other ways by your partner or ex-partner?: No    Within the last year, have you been kicked, hit, slapped, or otherwise physically hurt by your partner or ex-partner?: No    Within the last year, have you been raped or forced to have any kind of sexual activity by your partner or ex-partner?: No    Subjective: Review of Systems  Constitutional:  Negative for chills and fever.  HENT:  Negative for congestion and hearing loss.   Eyes:  Negative for blurred vision and double vision.  Respiratory:  Negative for cough and  shortness of breath.   Cardiovascular:  Negative for chest pain and palpitations.  Gastrointestinal:  Positive for abdominal pain, constipation and heartburn. Negative for blood in stool, diarrhea, melena and vomiting.   Genitourinary:  Negative for dysuria and urgency.  Musculoskeletal:  Negative for joint pain and myalgias.  Skin:  Negative for itching and rash.  Neurological:  Negative for dizziness and headaches.  Psychiatric/Behavioral:  Negative for depression. The patient is not nervous/anxious.        Objective: BP 123/73   Pulse 80   Temp 98.1 F (36.7 C)   Ht 5' 4 (1.626 m)   Wt 173 lb 8 oz (78.7 kg)   LMP 11/28/2013 Comment: IUD  BMI 29.78 kg/m  Physical Exam Constitutional:      Appearance: Normal appearance.  HENT:     Head: Normocephalic and atraumatic.  Eyes:     Extraocular Movements: Extraocular movements intact.     Conjunctiva/sclera: Conjunctivae normal.  Cardiovascular:     Rate and Rhythm: Normal rate and regular rhythm.  Pulmonary:     Effort: Pulmonary effort is normal.     Breath sounds: Normal breath sounds.  Abdominal:     General: Bowel sounds are normal.     Palpations: Abdomen is soft.  Musculoskeletal:        General: No swelling. Normal range of motion.     Cervical back: Normal range of motion and neck supple.  Skin:    General: Skin is warm and dry.     Coloration: Skin is not jaundiced.  Neurological:     General: No focal deficit present.     Mental Status: She is alert and oriented to person, place, and time.  Psychiatric:        Mood and Affect: Mood normal.        Behavior: Behavior normal.      Assessment/Plan:  1.  Chronic GERD, epigastric/chest pain-improved, continue on pantoprazole  twice daily.  Pepcid  on top of this as needed.  No longer taking Carafate .  2.  Irritable bowel syndrome with constipation-currently taking 6 stool softeners without adequate relief.  Amitiza  did not help.  Symptoms improved on Linzess  290 mcg daily.  Will send in formal prescription today.  If not covered, looks like [employer]  is on her coverage list.  Can try this.   Follow-up in 3 months.   12/30/2023 10:29 AM   Disclaimer: This note was  dictated with voice recognition software. Similar sounding words can inadvertently be transcribed and may not be corrected upon review.

## 2023-12-30 NOTE — Telephone Encounter (Signed)
 Pt called and advised of the $100.00 cost of  Linzess  . I proceeded with a PA and her insurance does not require a PA. Pt requests that you send in something else that her insurance will cover. She stated you advised her of another medication that you could send in. Please advise.

## 2023-12-31 ENCOUNTER — Other Ambulatory Visit: Payer: Self-pay | Admitting: Internal Medicine

## 2023-12-31 MED ORDER — PRUCALOPRIDE SUCCINATE 2 MG PO TABS: 2.0000 mg | ORAL_TABLET | Freq: Every day | ORAL | 11 refills | Status: AC

## 2024-01-14 ENCOUNTER — Ambulatory Visit: Payer: PPO | Admitting: Family Medicine

## 2024-01-26 DIAGNOSIS — R519 Headache, unspecified: Secondary | ICD-10-CM | POA: Diagnosis not present

## 2024-01-26 DIAGNOSIS — R110 Nausea: Secondary | ICD-10-CM | POA: Diagnosis not present

## 2024-01-26 DIAGNOSIS — E119 Type 2 diabetes mellitus without complications: Secondary | ICD-10-CM | POA: Diagnosis not present

## 2024-01-26 DIAGNOSIS — Z299 Encounter for prophylactic measures, unspecified: Secondary | ICD-10-CM | POA: Diagnosis not present

## 2024-02-03 DIAGNOSIS — Z1283 Encounter for screening for malignant neoplasm of skin: Secondary | ICD-10-CM | POA: Diagnosis not present

## 2024-02-03 DIAGNOSIS — D2272 Melanocytic nevi of left lower limb, including hip: Secondary | ICD-10-CM | POA: Diagnosis not present

## 2024-02-03 DIAGNOSIS — L814 Other melanin hyperpigmentation: Secondary | ICD-10-CM | POA: Diagnosis not present

## 2024-02-03 DIAGNOSIS — L821 Other seborrheic keratosis: Secondary | ICD-10-CM | POA: Diagnosis not present

## 2024-02-03 DIAGNOSIS — D225 Melanocytic nevi of trunk: Secondary | ICD-10-CM | POA: Diagnosis not present

## 2024-02-03 DIAGNOSIS — L718 Other rosacea: Secondary | ICD-10-CM | POA: Diagnosis not present

## 2024-02-03 DIAGNOSIS — L813 Cafe au lait spots: Secondary | ICD-10-CM | POA: Diagnosis not present

## 2024-02-04 DIAGNOSIS — M7741 Metatarsalgia, right foot: Secondary | ICD-10-CM | POA: Diagnosis not present

## 2024-02-04 DIAGNOSIS — M76822 Posterior tibial tendinitis, left leg: Secondary | ICD-10-CM | POA: Diagnosis not present

## 2024-02-04 DIAGNOSIS — L84 Corns and callosities: Secondary | ICD-10-CM | POA: Diagnosis not present

## 2024-02-04 DIAGNOSIS — M7742 Metatarsalgia, left foot: Secondary | ICD-10-CM | POA: Diagnosis not present

## 2024-02-15 DIAGNOSIS — N6324 Unspecified lump in the left breast, lower inner quadrant: Secondary | ICD-10-CM | POA: Diagnosis not present

## 2024-02-15 DIAGNOSIS — R928 Other abnormal and inconclusive findings on diagnostic imaging of breast: Secondary | ICD-10-CM | POA: Diagnosis not present

## 2024-02-16 DIAGNOSIS — G4733 Obstructive sleep apnea (adult) (pediatric): Secondary | ICD-10-CM | POA: Diagnosis not present

## 2024-02-16 DIAGNOSIS — J4550 Severe persistent asthma, uncomplicated: Secondary | ICD-10-CM | POA: Diagnosis not present

## 2024-02-16 DIAGNOSIS — J301 Allergic rhinitis due to pollen: Secondary | ICD-10-CM | POA: Diagnosis not present

## 2024-02-23 ENCOUNTER — Encounter: Payer: Self-pay | Admitting: Internal Medicine

## 2024-03-08 NOTE — Progress Notes (Unsigned)
 No chief complaint on file.   HISTORY OF PRESENT ILLNESS:  03/08/24 ALL:  [name] returns for follow up for migraines and tremor. She was last seen by me 07/2022 and doing well on topiramate  50mg  BID and rizatriptan  as needed.   tremor  07/21/2022 ALL:  Nancy [name] is a 58 y.o. female here today for follow up for tremor and migraines. She was last seen by video with Dr Margaret who started topiramate  50mg  BID and rizatriptan  as needed. She reports headache frequency and severity has decreased. She still has regular headaches about 12 headache a month. She has not had any severe headaches, recently. She does not remember the last migraine. She has used rizatriptan  a couple of times and feels it helped.   Tremor is stable. No significant change. She continues to notice most in social situations but can have tremor even in times when not anxious or active. She reports sometimes her whole body shakes. She is adjusting her Wellbutrin dose to see if this helps. She is followed closely by PCP.    HISTORY (copied from Dr Chancy previous note)  UPDATE (01/14/22, VRP): Since last visit, more tremors. More migraines (left sided, sens to light, nausea, cant sleep). Similar to prior migraine (since teenage years). Now worse in last 3 months.    PRIOR HPI: 58 year old female here for evaluation of tremor.  Has had postural tremor since at least 2015, previously on primidone .  She does not remember taking this medicine in the past but I can see it on her prior neurology evaluation notes from Dr. Skeet and Dr. Jenel.    Symptoms worsening in the last few months.  Symptoms affecting her day-to-day activities and quality of life.   No significant triggering or aggravating factors.  No alleviating factors.   REVIEW OF SYSTEMS: Out of a complete 14 system review of symptoms, the patient complains only of the following symptoms, tremor, headaches, anxiety and all other reviewed systems are  negative.   ALLERGIES: Allergies  Allergen Reactions   Cymbalta [Duloxetine Hcl] Other (See Comments)    serotonin syndrome   Divalproex Sodium Other (See Comments)    unknown   Effexor [Venlafaxine Hydrochloride] Other (See Comments)    serotonin syndrome   Geodon [Ziprasidone Hydrochloride] Other (See Comments)    Hands and feet tingling.   Prozac [Fluoxetine Hcl] Other (See Comments)    suicidal   Thorazine [Chlorpromazine Hcl] Other (See Comments)    unknown   Cephalexin Rash    Joint swelling   Cephalosporins Rash    Joint swelling   Keflex   Codeine Nausea Only    Pt states must take phenergan  with codeine.       Morphine  Nausea Only        Theo-Dur [Theophylline] Palpitations   Ziprasidone Nausea Only and Other (See Comments)    Hands and feet tingling.     HOME MEDICATIONS: Outpatient Medications Prior to Visit  Medication Sig Dispense Refill   albuterol  (PROVENTIL ) (2.5 MG/3ML) 0.083% nebulizer solution Take 2.5 mg by nebulization every 6 (six) hours as needed for shortness of breath or wheezing.     Ascorbic Acid (VITAMIN C) 1000 MG tablet Take 1,000 mg by mouth daily.     atorvastatin (LIPITOR) 10 MG tablet Take 10 mg by mouth daily.     BLACK ELDERBERRY PO Take 1 each by mouth daily. Elderberry gummy     BREZTRI  AEROSPHERE 160-9-4.8 MCG/ACT AERO INHALE 2 PUFFS INTO THE LUNGS  IN THE MORNING AND AT BEDTIME. 10.7 each 3   carbamazepine  (TEGRETOL ) 100 MG chewable tablet Chew 300 mg by mouth at bedtime.     cetirizine (ZYRTEC) 10 MG tablet Take 10 mg by mouth daily.     Cholecalciferol (VITAMIN D3) 50 MCG (2000 UT) capsule Take 2,000 Units by mouth daily.     ciprofloxacin  (CIPRO ) 500 MG tablet Take 1 tablet (500 mg total) by mouth 2 (two) times daily. One po bid x 7 days 10 tablet 0   Cyanocobalamin (VITAMIN B-12) 2500 MCG SUBL Place 2,500 mcg under the tongue daily.     cyclobenzaprine (FLEXERIL) 10 MG tablet Take 10 mg by mouth at bedtime.     dicyclomine   (BENTYL ) 20 MG tablet Take 1 every 8 hours as needed for abdominal cramps 20 tablet 0   docusate sodium (COLACE) 50 MG capsule Take 150 mg by mouth 2 (two) times daily.     EPINEPHrine  0.3 mg/0.3 mL IJ SOAJ injection Inject 0.3 mg into the muscle as needed for anaphylaxis.     estradiol (ESTRACE) 0.1 MG/GM vaginal cream Place vaginally.     famotidine  (PEPCID ) 20 MG tablet Take 0.5 tablets (10 mg total) by mouth 2 (two) times daily. 20 tablet 0   gabapentin (NEURONTIN) 300 MG capsule Take 300 mg by mouth at bedtime.     lamoTRIgine (LAMICTAL) 150 MG tablet Take 300 mg by mouth at bedtime.     lisinopril (ZESTRIL) 5 MG tablet Take 1 tablet by mouth daily.     meloxicam (MOBIC) 15 MG tablet Take 15 mg by mouth daily.     Mepolizumab  (NUCALA ) 100 MG/ML SOAJ Inject 1 mL (100 mg total) into the skin every 28 (twenty-eight) days. 1 mL 5   methocarbamol (ROBAXIN) 500 MG tablet Take 500 mg by mouth 2 (two) times daily.     montelukast (SINGULAIR) 10 MG tablet Take 10 mg by mouth at bedtime.     ondansetron  (ZOFRAN -ODT) 4 MG disintegrating tablet 4mg  ODT q4 hours prn nausea/vomit 10 tablet 0   oxyCODONE -acetaminophen  (PERCOCET/ROXICET) 5-325 MG tablet Take 1 tablet by mouth every 6 (six) hours as needed for severe pain (pain score 7-10). 8 tablet 0   pantoprazole  (PROTONIX ) 40 MG tablet Take 1 tablet (40 mg total) by mouth 2 (two) times daily. 60 tablet 11   primidone  (MYSOLINE ) 50 MG tablet Take 1 tablet (50 mg total) by mouth 3 (three) times daily. 270 tablet 1   Prucalopride Succinate  (MOTEGRITY ) 2 MG TABS Take 1 tablet (2 mg total) by mouth daily. 30 tablet 11   rizatriptan  (MAXALT -MLT) 10 MG disintegrating tablet TAKE 1 TABLET BY MOUTH AS NEEDED FOR MIGRAINE. MAY REPEAT IN 2 HOURS IF NEEDED 9 tablet 4   topiramate  (TOPAMAX ) 50 MG tablet Take 1 tablet (50 mg total) by mouth 2 (two) times daily. 180 tablet 3   No facility-administered medications prior to visit.     PAST MEDICAL HISTORY: Past  Medical History:  Diagnosis Date   Allergic rhinitis    Asthma    Benign familial tremor    Bipolar 1 disorder (HCC)    Chronic neck and back pain    Degenerative disk disease    Depression    DJD (degenerative joint disease), lumbar    Dyslipidemia    Fibromyalgia    GERD (gastroesophageal reflux disease)    Hiatal hernia    History of kidney stones    Irritable bowel syndrome (IBS)    Memory disorder 07/18/2014  Memory loss    Migraine headache    Nephrolithiasis    Pneumonia 1986   Serotonin syndrome    Tremor    hands,arms     PAST SURGICAL HISTORY: Past Surgical History:  Procedure Laterality Date   APPENDECTOMY     back fusion     BACK SURGERY     BIOPSY  07/07/2023   Procedure: BIOPSY;  Surgeon: Cindie Carlin POUR, DO;  Location: [facility];  Service: Endoscopy;;   BRONCHOSCOPY     S. aureus from BAL   CARDIAC CATHETERIZATION  11/27/2006   minimal CAD   CHOLECYSTECTOMY     COLONOSCOPY WITH PROPOFOL  N/A 12/12/2020   Procedure: COLONOSCOPY WITH PROPOFOL ;  Surgeon: Golda Claudis PENNER, MD;  Location: [facility];  Service: Endoscopy;  Laterality: N/A;  820   ESOPHAGEAL DILATION  08/06/2004   ESOPHAGOGASTRODUODENOSCOPY (EGD) WITH PROPOFOL  N/A 07/07/2023   Procedure: ESOPHAGOGASTRODUODENOSCOPY (EGD) WITH PROPOFOL ;  Surgeon: Cindie Carlin POUR, DO;  Location: [facility];  Service: Endoscopy;  Laterality: N/A;  200pm, asa 2   KNEE ARTHROSCOPY WITH MEDIAL MENISECTOMY Left 08/09/2021   Procedure: KNEE ARTHROSCOPY WITH PARTIAL MEDIAL MENISCECTOMY;  Surgeon: Margrette Taft BRAVO, MD;  Location: [facility];  Service: Orthopedics;  Laterality: Left;   LAPAROSCOPIC CHOLECYSTECTOMY     LASER ABLATION OF THE CERVIX     MICRODISSECTION L5-S1  08/13/2000   neck fusion     TONSILLECTOMY       FAMILY HISTORY: Family History  Problem Relation Age of Onset   Allergies Mother    Clotting disorder Mother    Diabetes Mother    Allergies Father    Heart disease Father     Heart attack Father    Diabetes Father    Allergies Brother    Chorea Maternal Grandmother      SOCIAL HISTORY: Social History   Socioeconomic History   Marital status: Married    Spouse name: Not on file   Number of children: 1   Years of education: HS   Highest education level: Not on file  Occupational History   Occupation: disabled    Employer: UNEMPLOYED  Tobacco Use   Smoking status: Former    Current packs/day: 0.00    Average packs/day: 0.5 packs/day for 8.0 years (4.0 ttl pk-yrs)    Types: Cigarettes    Start date: 06/09/1970    Quit date: 06/09/1978    Years since quitting: 45.7   Smokeless tobacco: Never  Vaping Use   Vaping status: Never Used  Substance and Sexual Activity   Alcohol  use: No   Drug use: No   Sexual activity: Not on file  Other Topics Concern   Not on file  Social History Narrative   Patient is right handed.   Patient does not drink caffeine.   Social Drivers of Corporate investment banker Strain: Not on file  Food Insecurity: Low Risk  (02/16/2024)   Received from [facility]   Hunger Vital Sign    Within the past 12 months, you worried that your food would run out before you got money to buy more: Never true    Within the past 12 months, the food you bought just didn't last and you didn't have money to get more. : Never true  Transportation Needs: No Transportation Needs (02/16/2024)   Received from [facility]    In the past 12 months, has lack of reliable transportation kept you from medical appointments, meetings,  work or from getting things needed for daily living? : No  Physical Activity: Not on file  Stress: Not on file  Social Connections: Not on file  Intimate Partner Violence: Not At Risk (12/16/2022)   Received from [facility]   Humiliation, Afraid, Rape, and Kick questionnaire    Within the last year, have you been afraid of your partner or ex-partner?: No    Within the last year, have you been  humiliated or emotionally abused in other ways by your partner or ex-partner?: No    Within the last year, have you been kicked, hit, slapped, or otherwise physically hurt by your partner or ex-partner?: No    Within the last year, have you been raped or forced to have any kind of sexual activity by your partner or ex-partner?: No     PHYSICAL EXAM  There were no vitals filed for this visit.  There is no height or weight on file to calculate BMI.  Generalized: Well developed, in no acute distress  Cardiology: normal rate and rhythm, no murmur auscultated  Respiratory: clear to auscultation bilaterally    Neurological examination  Mentation: Alert oriented to time, place, history taking. Follows all commands speech and language fluent Cranial nerve II-XII: Pupils were equal round reactive to light. Extraocular movements were full, visual field were full on confrontational test. Facial sensation and strength were normal. Head turning and shoulder shrug  were normal and symmetric. Motor: The motor testing reveals 5 over 5 strength of all 4 extremities. Good symmetric motor tone is noted throughout. Action tremor noted bilaterally  Sensory: Sensory testing is intact to soft touch on all 4 extremities. No evidence of extinction is noted.  Coordination: Cerebellar testing reveals good finger-nose-finger and heel-to-shin bilaterally.  Gait and station: Gait is normal.   DIAGNOSTIC DATA (LABS, IMAGING, TESTING) - I reviewed patient records, labs, notes, testing and imaging myself where available.  Lab Results  Component Value Date   WBC 7.7 09/27/2023   HGB 15.0 09/27/2023   HCT 44.0 09/27/2023   MCV 92.1 09/27/2023   PLT 243 09/27/2023      Component Value Date/Time   NA 137 09/27/2023 0639   K 3.2 (L) 09/27/2023 0639   CL 107 09/27/2023 0639   CO2 19 (L) 09/27/2023 0639   GLUCOSE 175 (H) 09/27/2023 0639   BUN 21 (H) 09/27/2023 0639   CREATININE 0.65 09/27/2023 0639   CALCIUM  8.7 (L) 09/27/2023 0639   PROT 7.3 09/27/2023 0639   ALBUMIN 4.0 09/27/2023 0639   AST 40 09/27/2023 0639   ALT 43 09/27/2023 0639   ALKPHOS 115 09/27/2023 0639   BILITOT 0.5 09/27/2023 0639   GFRNONAA >60 09/27/2023 0639   GFRAA  06/29/2010 1353    >60        The eGFR has been calculated using the MDRD equation. This calculation has not been validated in all clinical situations. eGFR's persistently <60 mL/min signify possible Chronic Kidney Disease.   No results found for: CHOL, HDL, LDLCALC, LDLDIRECT, TRIG, CHOLHDL Lab Results  Component Value Date   HGBA1C (H) 11/28/2006    6.7 (NOTE)   The ADA recommends the following therapeutic goals for glycemic   control related to Hgb A1C measurement:   Goal of Therapy:   < 7.0% Hgb A1C   Action Suggested:  > 8.0% Hgb A1C   Ref:  Diabetes Care, 22, Suppl. 1, 1999   Lab Results  Component Value Date   VITAMINB12 432 06/14/2013  No results found for: TSH     03/21/2015   11:32 AM 07/18/2014    3:21 PM  MMSE - Mini Mental State Exam  Orientation to time 5  5   Orientation to Place 4  4   Registration 3  3   Attention/ Calculation 5  5   Recall 2  2   Language- name 2 objects 2  2   Language- repeat 1 1  Language- follow 3 step command 3  3   Language- read & follow direction 1  1   Write a sentence 1  1   Copy design 1  1   Total score 28  28      Data saved with a previous flowsheet row definition         No data to display           ASSESSMENT AND PLAN  58 y.o. year old female  has a past medical history of Allergic rhinitis, Asthma, Benign familial tremor, Bipolar 1 disorder (HCC), Chronic neck and back pain, Degenerative disk disease, Depression, DJD (degenerative joint disease), lumbar, Dyslipidemia, Fibromyalgia, GERD (gastroesophageal reflux disease), Hiatal hernia, History of kidney stones, Irritable bowel syndrome (IBS), Memory disorder (07/18/2014), Memory loss, Migraine headache,  Nephrolithiasis, Pneumonia (1986), Serotonin syndrome, and Tremor. here with    No diagnosis found.  Nancy [name] is doing well. She reports headaches are less frequent and less severe. We will continue topiramate  50mg  BID and rizatriptan  as needed. We will continue primidone  but increase dose to 50mg  BID with extra 50mg  dose as needed. Not to exceed 150mg  daily. May consider referral to [facility] neurology for procedural based options if she wishes. Healthy lifestyle habits encouraged. She will follow up with PCP as directed. She will return to see me in 1 year, sooner if needed. She verbalizes understanding and agreement with this plan.    No orders of the defined types were placed in this encounter.    No orders of the defined types were placed in this encounter.    Greig Forbes, MSN, FNP-C 03/08/2024, 8:43 AM  [facility] [address] [place], [place] [address] [phone]

## 2024-03-08 NOTE — Patient Instructions (Incomplete)
 Below is our plan:  We will continue topiramate  50mg  twice daily and rizatriptan  as needed. Increase primidone  to 50mg  in am and 100mg  in the evenings. Let me know if headaches are not improving in 4-6 weeks.   Please make sure you are staying well hydrated. I recommend 50-60 ounces daily. Well balanced diet and regular exercise encouraged. Consistent sleep schedule with 6-8 hours recommended.   Please continue follow up with care team as directed.   Follow up with me in 1 year   You may receive a survey regarding today's visit. I encourage you to leave honest feed back as I do use this information to improve patient care. Thank you for seeing me today!   GENERAL HEADACHE INFORMATION:   Natural supplements: Magnesium Oxide or Magnesium Glycinate 500 mg at bed (up to 800 mg daily) Coenzyme Q10 300 mg in AM Vitamin B2- 200 mg twice a day   Add 1 supplement at a time since even natural supplements can have undesirable side effects. You can sometimes buy supplements cheaper (especially Coenzyme Q10) at www.WebmailGuide.co.za or at [facility].  Migraine with aura: There is increased risk for stroke in women with migraine with aura and a contraindication for the combined contraceptive pill for use by women who have migraine with aura. The risk for women with migraine without aura is lower. However other risk factors like smoking are far more likely to increase stroke risk than migraine. There is a recommendation for no smoking and for the use of OCPs without estrogen such as progestogen only pills particularly for women with migraine with aura.SABRA People who have migraine headaches with auras may be 3 times more likely to have a stroke caused by a blood clot, compared to migraine patients who don't see auras. Women who take hormone-replacement therapy may be 30 percent more likely to suffer a clot-based stroke than women not taking medication containing estrogen. Other risk factors like smoking and high blood  pressure may be  much more important.    Vitamins and herbs that show potential:   Magnesium: Magnesium (250 mg twice a day or 500 mg at bed) has a relaxant effect on smooth muscles such as blood vessels. Individuals suffering from frequent or daily headache usually have low magnesium levels which can be increase with daily supplementation of 400-750 mg. Three trials found 40-90% average headache reduction  when used as a preventative. Magnesium may help with headaches are aura, the best evidence for magnesium is for migraine with aura is its thought to stop the cortical spreading depression we believe is the pathophysiology of migraine aura.Magnesium also demonstrated the benefit in menstrually related migraine.  Magnesium is part of the messenger system in the serotonin cascade and it is a good muscle relaxant.  It is also useful for constipation which can be a side effect of other medications used to treat migraine. Good sources include nuts, whole grains, and tomatoes. Side Effects: loose stool/diarrhea  Riboflavin (vitamin B 2) 200 mg twice a day. This vitamin assists nerve cells in the production of ATP a principal energy storing molecule.  It is necessary for many chemical reactions in the body.  There have been at least 3 clinical trials of riboflavin using 400 mg per day all of which suggested that migraine frequency can be decreased.  All 3 trials showed significant improvement in over half of migraine sufferers.  The supplement is found in bread, cereal, milk, meat, and poultry.  Most Americans get more riboflavin than the  recommended daily allowance, however riboflavin deficiency is not necessary for the supplements to help prevent headache. Side effects: energizing, green urine   Coenzyme Q10: This is present in almost all cells in the body and is critical component for the conversion of energy.  Recent studies have shown that a nutritional supplement of CoQ10 can reduce the frequency of  migraine attacks by improving the energy production of cells as with riboflavin.  Doses of 150 mg twice a day have been shown to be effective.   Melatonin: Increasing evidence shows correlation between melatonin secretion and headache conditions.  Melatonin supplementation has decreased headache intensity and duration.  It is widely used as a sleep aid.  Sleep is natures way of dealing with migraine.  A dose of 3 mg is recommended to start for headaches including cluster headache. Higher doses up to 15 mg has been reviewed for use in Cluster headache and have been used. The rationale behind using melatonin for cluster is that many theories regarding the cause of Cluster headache center around the disruption of the normal circadian rhythm in the brain.  This helps restore the normal circadian rhythm.   HEADACHE DIET: Foods and beverages which may trigger migraine Note that only 20% of headache patients are food sensitive. You will know if you are food sensitive if you get a headache consistently 20 minutes to 2 hours after eating a certain food. Only cut out a food if it causes headaches, otherwise you might remove foods you enjoy! What matters most for diet is to eat a well balanced healthy diet full of vegetables and low fat protein, and to not miss meals.   Chocolate, other sweets ALL cheeses except cottage and cream cheese Dairy products, yogurt, sour cream, ice cream Liver Meat extracts (Bovril, Marmite, meat tenderizers) Meats or fish which have undergone aging, fermenting, pickling or smoking. These include: Hotdogs,salami,Lox,sausage, mortadellas,smoked salmon, pepperoni, Pickled herring Pods of broad bean (English beans, Chinese pea pods, [place] (fava) beans, lima and navy beans Ripe avocado, ripe banana Yeast extracts or active yeast preparations such as Brewer's or Fleishman's (commercial bakes goods are permitted) Tomato based foods, pizza (lasagna, etc.)   MSG (monosodium glutamate)  is disguised as many things; look for these common aliases: Monopotassium glutamate Autolysed yeast Hydrolysed protein Sodium caseinate "flavorings" "all natural preservatives Nutrasweet   Avoid all other foods that convincingly provoke headaches.   Resources: The Dizzy Bluford Aid Your Headache Diet, migrainestrong.com  https://zamora-andrews.com/   Caffeine and Migraine For patients that have migraine, caffeine intake more than 3 days per week can lead to dependency and increased migraine frequency. I would recommend cutting back on your caffeine intake as best you can. The recommended amount of caffeine is 200-300 mg daily, although migraine patients may experience dependency at even lower doses. While you may notice an increase in headache temporarily, cutting back will be helpful for headaches in the long run. For more information on caffeine and migraine, visit: https://americanmigrainefoundation.org/resource-library/caffeine-and-migraine/   Headache Prevention Strategies:   1. Maintain a headache diary; learn to identify and avoid triggers.  - This can be a simple note where you log when you had a headache, associated symptoms, and medications used - There are several smartphone apps developed to help track migraines: Migraine Buddy, Migraine Monitor, Curelator N1-Headache App   Common triggers include: Emotional triggers: Emotional/Upset family or friends Emotional/Upset occupation Business reversal/success Anticipation anxiety Crisis-serious Post-crisis periodNew job/position   Physical triggers: Vacation Day Weekend Strenuous Exercise High Altitude Location New  Move Menstrual Day Physical Illness Oversleep/Not enough sleep Weather changes Light: Photophobia or light sesnitivity treatment involves a balance between desensitization and reduction in overly strong input. Use dark polarized glasses outside, but not inside.  Avoid bright or fluorescent light, but do not dim environment to the point that going into a normally lit room hurts. Consider FL-41 tint lenses, which reduce the most irritating wavelengths without blocking too much light.  These can be obtained at axonoptics.com or theraspecs.com Foods: see list above.   2. Limit use of acute treatments (over-the-counter medications, triptans, etc.) to no more than 2 days per week or 10 days per month to prevent medication overuse headache (rebound headache).     3. Follow a regular schedule (including weekends and holidays): Don't skip meals. Eat a balanced diet. 8 hours of sleep nightly. Minimize stress. Exercise 30 minutes per day. Being overweight is associated with a 5 times increased risk of chronic migraine. Keep well hydrated and drink 6-8 glasses of water  per day.   4. Initiate non-pharmacologic measures at the earliest onset of your headache. Rest and quiet environment. Relax and reduce stress. Breathe2Relax is a free app that can instruct you on    some simple relaxtion and breathing techniques. Http://Dawnbuse.com is a    free website that provides teaching videos on relaxation.  Also, there are  many apps that   can be downloaded for "mindful" relaxation.  An app called YOGA NIDRA will help walk you through mindfulness. Another app called Calm can be downloaded to give you a structured mindfulness guide with daily reminders and skill development. Headspace for guided meditation Mindfulness Based Stress Reduction Online Course: www.palousemindfulness.com Cold compresses.   5. Don't wait!! Take the maximum allowable dosage of prescribed medication at the first sign of migraine.   6. Compliance:  Take prescribed medication regularly as directed and at the first sign of a migraine.   7. Communicate:  Call your physician when problems arise, especially if your headaches change, increase in frequency/severity, or become associated with neurological  symptoms (weakness, numbness, slurred speech, etc.). Proceed to emergency room if you experience new or worsening symptoms or symptoms do not resolve, if you have new neurologic symptoms or if headache is severe, or for any concerning symptom.   8. Headache/pain management therapies: Consider various complementary methods, including medication, behavioral therapy, psychological counselling, biofeedback, massage therapy, acupuncture, dry needling, and other modalities.  Such measures may reduce the need for medications. Counseling for pain management, where patients learn to function and ignore/minimize their pain, seems to work very well.   9. Recommend changing family's attention and focus away from patient's headaches. Instead, emphasize daily activities. If first question of day is 'How are your headaches/Do you have a headache today?', then patient will constantly think about headaches, thus making them worse. Goal is to re-direct attention away from headaches, toward daily activities and other distractions.   10. Helpful Websites: www.AmericanHeadacheSociety.org PatentHood.ch www.headaches.org TightMarket.nl www.achenet.org   Management of Memory Problems   There are some general things you can do to help manage your memory problems.  Your memory may not in fact recover, but by using techniques and strategies you will be able to manage your memory difficulties better.   1)  Establish a routine. Try to establish and then stick to a regular routine.  By doing this, you will get used to what to expect and you will reduce the need to rely on your memory.  Also, try to do things  at the same time of day, such as taking your medication or checking your calendar first thing in the morning. Think about think that you can do as a part of a regular routine and make a list.  Then enter them into a daily planner to remind you.  This will help you establish a routine.   2)  Organize your  environment. Organize your environment so that it is uncluttered.  Decrease visual stimulation.  Place everyday items such as keys or cell phone in the same place every day (ie.  Basket next to front door) Use post it notes with a brief message to yourself (ie. Turn off light, lock the door) Use labels to indicate where things go (ie. Which cupboards are for food, dishes, etc.) Keep a notepad and pen by the telephone to take messages   3)  Memory Aids A diary or journal/notebook/daily planner Making a list (shopping list, chore list, to do list that needs to be done) Using an alarm as a reminder (kitchen timer or cell phone alarm) Using cell phone to store information (Notes, Calendar, Reminders) Calendar/White board placed in a prominent position Post-it notes   In order for memory aids to be useful, you need to have good habits.  It's no good remembering to make a note in your journal if you don't remember to look in it.  Try setting aside a certain time of day to look in journal.   4)  Improving mood and managing fatigue. There may be other factors that contribute to memory difficulties.  Factors, such as anxiety, depression and tiredness can affect memory. Regular gentle exercise can help improve your mood and give you more energy. Exercise: there are short videos created by the [employer] on Health specially for older adults: https://bit.ly/2I30q97.  Mediterranean diet: which emphasizes fruits, vegetables, whole grains, legumes, fish, and other seafood; unsaturated fats such as olive oils; and low amounts of red meat, eggs, and sweets. A variation of this, called MIND (Mediterranean-DASH Intervention for Neurodegenerative Delay) incorporates the DASH (Dietary Approaches to Stop Hypertension) diet, which has been shown to lower high blood pressure, a risk factor for Alzheimer's disease. More information at:  ExitMarketing.de.  Aerobic exercise that improve heart health is also good for the mind.  [employer] on Aging have short videos for exercises that you can do at home: BlindWorkshop.com.pt Simple relaxation techniques may help relieve symptoms of anxiety Try to get back to completing activities or hobbies you enjoyed doing in the past. Learn to pace yourself through activities to decrease fatigue. Find out about some local support groups where you can share experiences with others. Try and achieve 7-8 hours of sleep at night.   Tasks to improve attention/working memory 1. Good sleep hygiene (7-8 hrs of sleep) 2. Learning a new skill (Painting, Carpentry, Pottery, new language, Knitting). 3.Cognitive exercises (keep a daily journal, Puzzles) 4. Physical exercise and training  (30 min/day X 4 days week) 5. Being on Antidepressant if needed 6.Yoga, Meditation, Tai Chi 7. Decrease alcohol  intake 8.Have a clear schedule and structure in daily routine   MIND Diet: The Mediterranean-DASH Diet Intervention for Neurodegenerative Delay, or MIND diet, targets the health of the aging brain. Research participants with the highest MIND diet scores had a significantly slower rate of cognitive decline compared with those with the lowest scores. The effects of the MIND diet on cognition showed greater effects than either the Mediterranean or the DASH diet alone.   The healthy items  the MIND diet guidelines suggest include:   3+ servings a day of whole grains 1+ servings a day of vegetables (other than green leafy) 6+ servings a week of green leafy vegetables 5+ servings a week of nuts 4+ meals a week of beans 2+ servings a week of berries 2+ meals a week of poultry 1+ meals a week of fish Mainly olive oil if added fat is used   The unhealthy items, which are higher in saturated and trans fat, include: Less than 5  servings a week of pastries and sweets Less than 4 servings a week of red meat (including beef, pork, lamb, and products made from these meats) Less than one serving a week of cheese and fried foods Less than 1 tablespoon a day of butter/stick margarine

## 2024-03-09 ENCOUNTER — Encounter: Payer: Self-pay | Admitting: Family Medicine

## 2024-03-09 ENCOUNTER — Ambulatory Visit: Admitting: Family Medicine

## 2024-03-09 VITALS — BP 115/78 | HR 89 | Ht 64.0 in | Wt 174.0 lb

## 2024-03-09 DIAGNOSIS — R413 Other amnesia: Secondary | ICD-10-CM

## 2024-03-09 DIAGNOSIS — G43009 Migraine without aura, not intractable, without status migrainosus: Secondary | ICD-10-CM

## 2024-03-09 DIAGNOSIS — G250 Essential tremor: Secondary | ICD-10-CM

## 2024-03-09 DIAGNOSIS — R42 Dizziness and giddiness: Secondary | ICD-10-CM

## 2024-03-31 DIAGNOSIS — Z299 Encounter for prophylactic measures, unspecified: Secondary | ICD-10-CM | POA: Diagnosis not present

## 2024-03-31 DIAGNOSIS — I509 Heart failure, unspecified: Secondary | ICD-10-CM | POA: Diagnosis not present

## 2024-03-31 DIAGNOSIS — R000 Tachycardia, unspecified: Secondary | ICD-10-CM | POA: Diagnosis not present

## 2024-04-06 DIAGNOSIS — R002 Palpitations: Secondary | ICD-10-CM | POA: Diagnosis not present

## 2024-04-11 DIAGNOSIS — R0602 Shortness of breath: Secondary | ICD-10-CM | POA: Diagnosis not present

## 2024-04-14 ENCOUNTER — Ambulatory Visit (INDEPENDENT_AMBULATORY_CARE_PROVIDER_SITE_OTHER): Admitting: Audiology

## 2024-04-14 ENCOUNTER — Encounter (INDEPENDENT_AMBULATORY_CARE_PROVIDER_SITE_OTHER): Payer: Self-pay | Admitting: Otolaryngology

## 2024-04-14 ENCOUNTER — Ambulatory Visit (INDEPENDENT_AMBULATORY_CARE_PROVIDER_SITE_OTHER): Admitting: Otolaryngology

## 2024-04-14 VITALS — BP 121/76 | HR 84 | Temp 97.6°F | Ht 64.0 in | Wt 170.0 lb

## 2024-04-14 DIAGNOSIS — H903 Sensorineural hearing loss, bilateral: Secondary | ICD-10-CM | POA: Diagnosis not present

## 2024-04-14 DIAGNOSIS — R2689 Other abnormalities of gait and mobility: Secondary | ICD-10-CM | POA: Diagnosis not present

## 2024-04-14 DIAGNOSIS — R42 Dizziness and giddiness: Secondary | ICD-10-CM | POA: Diagnosis not present

## 2024-04-14 DIAGNOSIS — Z0110 Encounter for examination of ears and hearing without abnormal findings: Secondary | ICD-10-CM | POA: Diagnosis not present

## 2024-04-14 DIAGNOSIS — H6123 Impacted cerumen, bilateral: Secondary | ICD-10-CM

## 2024-04-14 NOTE — Progress Notes (Signed)
 Dear Dr. Rosamond, Here is my assessment for our mutual patient, Nancy [name]. Thank you for allowing me the opportunity to care for your patient. Please do not hesitate to contact me should you have any other questions. Sincerely, Dr. Eldora Blanch  Otolaryngology Clinic Note Referring provider: Dr. Rosamond HPI:  Initial visit (04/2024): Discussed the use of AI scribe software for clinical note transcription with the patient, who gave verbal consent to proceed.  History of Present Illness Nancy [name] is a 58 year old female who presents with balance issues and vertigo.  She experienced vertigo several months ago, but this was episodic, and now has a sensation of everything moving side to side for about one and a half to two weeks and then gradually resolved, which was accompanied by nausea. She has tried Meclizine and a scopolamine patch, which were ineffective, but Valium  alleviated the side-to-side sensation and head bobbing. It still occurs, however, and she also has a sensation of room spinning when she rolls over in bed, which lasts a few seconds.  Still, however, She feels persistently unsteady without room spinning. No fullness, ringing, or other ear symptoms were present during the acute episode or currently.   There are no recent medication changes, ear surgeries, or trauma. She takes Zyrtec for allergies. She does follow with neurology. No facial weakness, body weakness or numbness. No headache currently, did have it in June.  Patient denies: ear pain, fullness, drainage, tinnitus, hearing change Patient additionally denies: deep pain in ear canal, eustachian tube symptoms such as popping, crackling, sensitive to pressure changes Patient also denies barotrauma, ototoxic medication use Prior ear surgery: no  Does have familiar tremor.   Personal or FHx of bleeding dz or anesthesia difficulty: no   Tobacco: former, quit  PMHx: GERD,  Severe Asthma, Migraines, IBS, Serotonin  syndrome Hx, Memory Impairment HTN, BP Dz  Independent Review of Additional Tests or Records:  CBC and CMP 09/27/2023: WBC 7.7, Plt 243; BUN/Cr 21/0.65 Dr. Rosamond Referral notes reviewed and uploaded or available in chart in media tab (11/24/2023): noted headaches over temple, ongoing for 1 week, fluctuating; tried excedrin without help; feels out of balance, Meclizine does not help; h/o allergies; watching moving objects makes things worse; Dx: Dizziness, ref to ENT 04/2024 Audiogram was independently reviewed and interpreted by me and it reveals - normal hearing thresholds (upsloping? With supranormal thresholds at 4K Hz) and mild SNHL AU; WRT 100% at 50dB AU; AD/AS C/A tymps;  SNHL= Sensorineural hearing loss  PMH/Meds/All/SocHx/FamHx/ROS:   Past Medical History:  Diagnosis Date   Allergic rhinitis    Asthma    Benign familial tremor    Bipolar 1 disorder (HCC)    Chronic neck and back pain    Degenerative disk disease    Depression    DJD (degenerative joint disease), lumbar    Dyslipidemia    Fibromyalgia    GERD (gastroesophageal reflux disease)    Hiatal hernia    History of kidney stones    Irritable bowel syndrome (IBS)    Memory disorder 07/18/2014   Memory loss    Migraine headache    Nephrolithiasis    Pneumonia 1986   Serotonin syndrome    Tremor    hands,arms     Past Surgical History:  Procedure Laterality Date   APPENDECTOMY     back fusion     BACK SURGERY     BIOPSY  07/07/2023   Procedure: BIOPSY;  Surgeon: Cindie Carlin POUR, DO;  [facility];  Service: Endoscopy;;   BRONCHOSCOPY     S. aureus from BAL   CARDIAC CATHETERIZATION  11/27/2006   minimal CAD   CHOLECYSTECTOMY     COLONOSCOPY WITH PROPOFOL  N/A 12/12/2020   Procedure: COLONOSCOPY WITH PROPOFOL ;  Surgeon: Golda Claudis PENNER, MD;  [facility]: [facility];  Service: Endoscopy;  Laterality: N/A;  820   ESOPHAGEAL DILATION  08/06/2004   ESOPHAGOGASTRODUODENOSCOPY (EGD) WITH PROPOFOL  N/A  07/07/2023   Procedure: ESOPHAGOGASTRODUODENOSCOPY (EGD) WITH PROPOFOL ;  Surgeon: Cindie Carlin POUR, DO;  [facility];  Service: Endoscopy;  Laterality: N/A;  200pm, asa 2   KNEE ARTHROSCOPY WITH MEDIAL MENISECTOMY Left 08/09/2021   Procedure: KNEE ARTHROSCOPY WITH PARTIAL MEDIAL MENISCECTOMY;  Surgeon: Margrette Taft BRAVO, MD;  Location: [facility];  Service: Orthopedics;  Laterality: Left;   LAPAROSCOPIC CHOLECYSTECTOMY     LASER ABLATION OF THE CERVIX     MICRODISSECTION L5-S1  08/13/2000   neck fusion     TONSILLECTOMY      Family History  Problem Relation Age of Onset   Allergies Mother    Clotting disorder Mother    Diabetes Mother    Allergies Father    Heart disease Father    Heart attack Father    Diabetes Father    Allergies Brother    Chorea Maternal Grandmother      Social Connections: Not on file      Current Outpatient Medications:    albuterol  (PROVENTIL ) (2.5 MG/3ML) 0.083% nebulizer solution, Take 2.5 mg by nebulization every 6 (six) hours as needed for shortness of breath or wheezing., Disp: , Rfl:    Ascorbic Acid (VITAMIN C) 1000 MG tablet, Take 1,000 mg by mouth daily., Disp: , Rfl:    atorvastatin (LIPITOR) 10 MG tablet, Take 10 mg by mouth daily., Disp: , Rfl:    BLACK ELDERBERRY PO, Take 1 each by mouth daily. Elderberry gummy, Disp: , Rfl:    BREZTRI  AEROSPHERE 160-9-4.8 MCG/ACT AERO, INHALE 2 PUFFS INTO THE LUNGS IN THE MORNING AND AT BEDTIME., Disp: 10.7 each, Rfl: 3   carbamazepine  (TEGRETOL ) 100 MG chewable tablet, Chew 300 mg by mouth at bedtime., Disp: , Rfl:    cetirizine (ZYRTEC) 10 MG tablet, Take 10 mg by mouth daily., Disp: , Rfl:    Cholecalciferol (VITAMIN D3) 50 MCG (2000 UT) capsule, Take 2,000 Units by mouth daily., Disp: , Rfl:    ciprofloxacin  (CIPRO ) 500 MG tablet, Take 1 tablet (500 mg total) by mouth 2 (two) times daily. One po bid x 7 days, Disp: 10 tablet, Rfl: 0   Cyanocobalamin (VITAMIN B-12) 2500 MCG SUBL, Place 2,500 mcg  under the tongue daily., Disp: , Rfl:    cyclobenzaprine (FLEXERIL) 10 MG tablet, Take 10 mg by mouth at bedtime., Disp: , Rfl:    dicyclomine  (BENTYL ) 20 MG tablet, Take 1 every 8 hours as needed for abdominal cramps, Disp: 20 tablet, Rfl: 0   docusate sodium (COLACE) 50 MG capsule, Take 150 mg by mouth 2 (two) times daily., Disp: , Rfl:    EPINEPHrine  0.3 mg/0.3 mL IJ SOAJ injection, Inject 0.3 mg into the muscle as needed for anaphylaxis., Disp: , Rfl:    estradiol (ESTRACE) 0.1 MG/GM vaginal cream, Place vaginally., Disp: , Rfl:    famotidine  (PEPCID ) 20 MG tablet, Take 0.5 tablets (10 mg total) by mouth 2 (two) times daily., Disp: 20 tablet, Rfl: 0   gabapentin (NEURONTIN) 300 MG capsule, Take 300 mg by mouth at  bedtime., Disp: , Rfl:    lamoTRIgine (LAMICTAL) 150 MG tablet, Take 300 mg by mouth at bedtime., Disp: , Rfl:    lisinopril (ZESTRIL) 5 MG tablet, Take 1 tablet by mouth daily., Disp: , Rfl:    meloxicam (MOBIC) 15 MG tablet, Take 15 mg by mouth daily., Disp: , Rfl:    Mepolizumab  (NUCALA ) 100 MG/ML SOAJ, Inject 1 mL (100 mg total) into the skin every 28 (twenty-eight) days., Disp: 1 mL, Rfl: 5   methocarbamol (ROBAXIN) 500 MG tablet, Take 500 mg by mouth 2 (two) times daily., Disp: , Rfl:    montelukast (SINGULAIR) 10 MG tablet, Take 10 mg by mouth at bedtime., Disp: , Rfl:    ondansetron  (ZOFRAN -ODT) 4 MG disintegrating tablet, 4mg  ODT q4 hours prn nausea/vomit, Disp: 10 tablet, Rfl: 0   oxyCODONE -acetaminophen  (PERCOCET/ROXICET) 5-325 MG tablet, Take 1 tablet by mouth every 6 (six) hours as needed for severe pain (pain score 7-10)., Disp: 8 tablet, Rfl: 0   pantoprazole  (PROTONIX ) 40 MG tablet, Take 1 tablet (40 mg total) by mouth 2 (two) times daily., Disp: 60 tablet, Rfl: 11   primidone  (MYSOLINE ) 50 MG tablet, Take 1 tablet (50 mg total) by mouth 3 (three) times daily., Disp: 270 tablet, Rfl: 1   Prucalopride Succinate  (MOTEGRITY ) 2 MG TABS, Take 1 tablet (2 mg total) by mouth  daily., Disp: 30 tablet, Rfl: 11   rizatriptan  (MAXALT -MLT) 10 MG disintegrating tablet, TAKE 1 TABLET BY MOUTH AS NEEDED FOR MIGRAINE. MAY REPEAT IN 2 HOURS IF NEEDED, Disp: 9 tablet, Rfl: 4   topiramate  (TOPAMAX ) 50 MG tablet, Take 1 tablet (50 mg total) by mouth 2 (two) times daily., Disp: 180 tablet, Rfl: 3   Physical Exam:   BP 121/76   Pulse 84   Temp 97.6 F (36.4 C)   Ht 5' 4 (1.626 m)   Wt 170 lb (77.1 kg)   LMP 11/28/2013 Comment: IUD  SpO2 97%   BMI 29.18 kg/m   Salient findings:  CN II-XII intact Mild head bobbing, no nystagmus grossly Head shake negative Positive Romberg DH negative Bilateral cerumen impaction; after clearance, Bilateral EAC clear and TM intact with well pneumatized middle ear spaces Weber 512: mid Rinne 512: AC > BC b/l  Anterior rhinoscopy: Septum intact; bilateral inferior turbinates without significant hypertrophy No lesions of oral cavity/oropharynx No obviously palpable neck masses/lymphadenopathy/thyromegaly No respiratory distress or stridor  Seprately Identifiable Procedures:  Prior to initiating any procedures, risks/benefits/alternatives were explained to the patient and verbal consent obtained. Procedure: Bilateral ear microscopy and cerumen removal using microscope (CPT [phone]) - Mod 25 Pre-procedure diagnosis: Cerumen impaction bilateral external ears Post-procedure diagnosis: same Indication: Bilateral cerumen impaction; given patient's otologic complaints and history as well as for improved and comprehensive examination of external ear and tympanic membrane, bilateral otologic examination using microscope was performed and impacted cerumen removed  Procedure: Patient was placed semi-recumbent. Both ear canals were examined using the microscope with findings above. Impacted Cerumen removed on left and on right using suction and currette with improvement in EAC examination and patency. Patient tolerated the procedure  well.      Impression & Plans:  Mafalda Mcginniss is a 58 y.o. female with:  1. Imbalance   2. Sensorineural hearing loss (SNHL) of both ears    Chronic imbalance with occasional head bobbing - this is more chronic. She also seems to have episodes more consistent with BPPV when she rolls over in bed. Unclear what to make of the longer-term  vertigo episode but does not have sx consistne twith Meniere's Romberg positive today.   Given her clear head bobbing and imbalance, do think formal vestibular testing useful --- will refer to [facility]; will also refer to vestibular therapy Recommend contacting to re-establish care with Neuro given + Romberg today F/u in 2-3 months; if no resolution or regardless, consider MRI; wonder if this is more central in nature; no frank vertigo but can take meclizine PRN - does not seem to help  See below regarding exact medications prescribed this encounter including dosages and route: No orders of the defined types were placed in this encounter.     Thank you for allowing me the opportunity to care for your patient. Please do not hesitate to contact me should you have any other questions.  Sincerely, Eldora Blanch, MD Otolaryngologist (ENT), [facility] Phone: [phone] Fax: [phone]  04/23/2024, 2:35 PM   MDM:  Level 4 - 99204 Complexity/Problems addressed: mod Data complexity: mod - independent review of note, lab, test - Morbidity: low  - Prescription Drug prescribed or managed: n

## 2024-04-14 NOTE — Progress Notes (Signed)
  [address] [place], [place] [address] [phone]  Audiological Evaluation    Name: [name]     DOB:   07-09-1965      MRN:   [id number]                                                                                     Service Date: 04/14/2024     Accompanied by: unaccompanied   Patient comes today after Dr. Tobie, ENT sent a referral for a hearing evaluation due to concerns with dizziness.   Symptoms Yes Details  Hearing loss  []    Tinnitus  [x]  rarely  Ear pain/ infections/pressure  []    Balance problems  [x]  Couple of months ago had vertigo where things moved side-to-side and also started having bopping head movements  Noise exposure history  []    Previous ear surgeries  []    Family history of hearing loss  [x]  Father with age and also had occupational noise exposure  Amplification  []    Other  []      Otoscopy: Right ear: Clear external ear canal and notable landmarks visualized on the tympanic membrane. Left ear:  Non-occluding cerumen, able to visualize some tympanic membrane landmarks.  Tympanometry: Right ear: Normal external ear canal volume with negative middle ear pressure and normal tympanic membrane compliance (Type C). Findings are suggestive of abnormal middle ear function.  Left ear: Normal external ear canal volume with normal middle ear pressure and tympanic membrane compliance (Type A). Findings are suggestive of normal middle ear function.   Hearing Evaluation The hearing test results were completed under headphones and results are deemed to be of good reliability. Test technique:  conventional    Pure tone Audiometry: Both ears: Normal  hearing from 125 Hz - 8000 Hz.  Speech Audiometry: Right ear- Speech Reception Threshold (SRT) was obtained at 5 dBHL. Left ear-Speech Reception Threshold (SRT) was obtained at 0 dBHL.   Word Recognition Score Tested using NU-6 (recorded) Right ear: 100% was obtained at a presentation  level of 50 dBHL with contralateral masking which is deemed as  excellent. Left ear: 100% was obtained at a presentation level of 50 dBHL with contralateral masking which is deemed as  excellent.   Impression: There is not a significant difference in pure-tone thresholds between ears. There is not a significant difference in the word recognition score in between ears.    Recommendations: Follow up with ENT as scheduled for today. Return for a hearing evaluation if concerns with hearing changes arise or per MD recommendation.   Nancy [name], AUD

## 2024-04-14 NOTE — Patient Instructions (Signed)
 [facility] Balance Testing: [phone] Call your Neurologist about your Balance

## 2024-04-20 ENCOUNTER — Encounter: Payer: Self-pay | Admitting: Audiology

## 2024-04-21 DIAGNOSIS — Z299 Encounter for prophylactic measures, unspecified: Secondary | ICD-10-CM | POA: Diagnosis not present

## 2024-04-21 DIAGNOSIS — R519 Headache, unspecified: Secondary | ICD-10-CM | POA: Diagnosis not present

## 2024-04-21 DIAGNOSIS — E119 Type 2 diabetes mellitus without complications: Secondary | ICD-10-CM | POA: Diagnosis not present

## 2024-04-21 DIAGNOSIS — J329 Chronic sinusitis, unspecified: Secondary | ICD-10-CM | POA: Diagnosis not present

## 2024-04-26 ENCOUNTER — Encounter (HOSPITAL_COMMUNITY): Payer: Self-pay

## 2024-04-26 ENCOUNTER — Ambulatory Visit (HOSPITAL_COMMUNITY): Attending: Otolaryngology

## 2024-04-26 DIAGNOSIS — R2681 Unsteadiness on feet: Secondary | ICD-10-CM | POA: Diagnosis not present

## 2024-04-26 DIAGNOSIS — R42 Dizziness and giddiness: Secondary | ICD-10-CM | POA: Diagnosis not present

## 2024-04-26 DIAGNOSIS — R2689 Other abnormalities of gait and mobility: Secondary | ICD-10-CM | POA: Diagnosis not present

## 2024-04-26 NOTE — Therapy (Signed)
 OUTPATIENT PHYSICAL THERAPY VESTIBULAR EVALUATION     Patient Name: Nancy [name] MRN: [id number] DOB:1966-05-12, 58 y.o., female Today's Date: 04/26/2024  END OF SESSION:  PT End of Session - 04/26/24 1117     Visit Number 1    Number of Visits 8    Date for Recertification  07/08/24    Authorization Type [employer]    Authorization Time Period no authorization required    PT Start Time 1117    PT Stop Time 1155    PT Time Calculation (min) 38 min    Activity Tolerance Patient tolerated treatment well    Behavior During Therapy WFL for tasks assessed/performed          Past Medical History:  Diagnosis Date   Allergic rhinitis    Asthma    Benign familial tremor    Bipolar 1 disorder (HCC)    Chronic neck and back pain    Degenerative disk disease    Depression    DJD (degenerative joint disease), lumbar    Dyslipidemia    Fibromyalgia    GERD (gastroesophageal reflux disease)    Hiatal hernia    History of kidney stones    Irritable bowel syndrome (IBS)    Memory disorder 07/18/2014   Memory loss    Migraine headache    Nephrolithiasis    Pneumonia 1986   Serotonin syndrome    Tremor    hands,arms   Past Surgical History:  Procedure Laterality Date   APPENDECTOMY     back fusion     BACK SURGERY     BIOPSY  07/07/2023   Procedure: BIOPSY;  Surgeon: Cindie Carlin POUR, DO;  [facility];  Service: Endoscopy;;   BRONCHOSCOPY     S. aureus from BAL   CARDIAC CATHETERIZATION  11/27/2006   minimal CAD   CHOLECYSTECTOMY     COLONOSCOPY WITH PROPOFOL  N/A 12/12/2020   Procedure: COLONOSCOPY WITH PROPOFOL ;  Surgeon: Golda Claudis PENNER, MD;  [facility];  Service: Endoscopy;  Laterality: N/A;  820   ESOPHAGEAL DILATION  08/06/2004   ESOPHAGOGASTRODUODENOSCOPY (EGD) WITH PROPOFOL  N/A 07/07/2023   Procedure: ESOPHAGOGASTRODUODENOSCOPY (EGD) WITH PROPOFOL ;  Surgeon: Cindie Carlin POUR, DO;  [facility];  Service:  Endoscopy;  Laterality: N/A;  200pm, asa 2   KNEE ARTHROSCOPY WITH MEDIAL MENISECTOMY Left 08/09/2021   Procedure: KNEE ARTHROSCOPY WITH PARTIAL MEDIAL MENISCECTOMY;  Surgeon: Margrette Taft BRAVO, MD;  Location: [facility];  Service: Orthopedics;  Laterality: Left;   LAPAROSCOPIC CHOLECYSTECTOMY     LASER ABLATION OF THE CERVIX     MICRODISSECTION L5-S1  08/13/2000   neck fusion     TONSILLECTOMY     Patient Active Problem List   Diagnosis Date Noted   Headache, migraine 10/31/2021   Acute medial meniscus tear of left knee    GERD (gastroesophageal reflux disease) 06/13/2021   Allergic rhinitis 12/30/2019   Degenerative lumbar disc 11/23/2018   Preoperative clearance 11/09/2018   Memory disorder 07/18/2014   Severe persistent asthma with acute bronchitis and acute exacerbation (HCC) 03/04/2013   Exercise-induced asthma 07/30/2012   Severe persistent asthma in adult without complication (HCC) 03/12/2011    PCP: Rosamond Leta NOVAK, MD  REFERRING PROVIDER: Tobie Eldora NOVAK, MD   REFERRING DIAG: Imbalance   THERAPY DIAG:  Dizziness and giddiness  Unsteadiness on feet  ONSET DATE: 4-5 months ago  Rationale for Evaluation and Treatment: Rehabilitation  SUBJECTIVE:   SUBJECTIVE STATEMENT: Patient reports that  she was told that she had vertigo about 4-5 months ago, but she felt like the room was shaking and not spinning. She had tried medication, but this did not help any. Valium  was the only thing that really helped. She has noticed that her head will want to spin when she lays down at night. She will also feel like a drunk person when she wakes up in the morning.  Pt accompanied by: self  PERTINENT HISTORY: asthma and memory deficits  PAIN:  Are you having pain? No  PRECAUTIONS: None  RED FLAGS: None   WEIGHT BEARING RESTRICTIONS: No  FALLS: Has patient fallen in last 6 months? No  LIVING ENVIRONMENT: Lives with: lives with their spouse Lives in: House/apartment Stairs:  Yes: External: 3 steps; on left going up; reciprocal pattern  Has following equipment at home: None  PLOF: Independent  PATIENT GOALS: resolve her symptoms  OBJECTIVE:  Note: Objective measures were completed at Evaluation unless otherwise noted.  COGNITION: Overall cognitive status: Within functional limits for tasks assessed   SENSATION: Patient reports no numbness or tingling  Cervical ROM:    Active A/PROM (deg) eval  Flexion WFL  Extension WFL   Right lateral flexion 75% limited   Left lateral flexion 75% limited  Right rotation WFL   Left rotation WFL   (Blank rows = not tested)  LOWER EXTREMITY MMT:   MMT Right eval Left eval  Hip flexion 4-/5 4-/5  Hip abduction    Hip adduction    Hip internal rotation    Hip external rotation    Knee flexion 4+/5 4+/5  Knee extension 4+/5 4+/5  Ankle dorsiflexion 4+/5 4+/5  Ankle plantarflexion    Ankle inversion    Ankle eversion    (Blank rows = not tested)  BED MOBILITY:  Sit to supine Complete Independence Supine to sit Complete Independence  TRANSFERS: Assistive device utilized: None  Sit to stand: Complete Independence Stand to sit: Complete Independence  PATIENT SURVEYS:  ABC scale: The Activities-Specific Balance Confidence (ABC) Scale 0% 10 20 30  40 50 60 70 80 90 100% No confidence<->completely confident  "How confident are you that you will not lose your balance or become unsteady when you . . .   Date tested 04/26/24  Walk around the house 100%  2. Walk up or down stairs 100%  3. Bend over and pick up a slipper from in front of a closet floor 100%  4. Reach for a small can off a shelf at eye level 100%  5. Stand on tip toes and reach for something above your head 50%  6. Stand on a chair and reach for something 0%  7. Sweep the floor 100%  8. Walk outside the house to a car parked in the driveway 100%  9. Get into or out of a car 100%  10. Walk across a parking lot to the mall 100%  11.  Walk up or down a ramp 100%  12. Walk in a crowded mall where people rapidly walk past you 100%  13. Are bumped into by people as you walk through the mall 100%  14. Step onto or off of an escalator while you are holding onto the railing 25%  15. Step onto or off an escalator while holding onto parcels such that you cannot hold onto the railing 0%  16. Walk outside on icy sidewalks 25%  Total: #/16  75%     VESTIBULAR ASSESSMENT:  GENERAL OBSERVATION: no LOB  with ambulation   SYMPTOM BEHAVIOR:  Subjective history: I've been bouncing off furniture for a long time now (years).   Non-Vestibular symptoms: headaches and migraine symptoms  Type of dizziness: room moves side to side  Frequency: multiple times every day  Duration: few seconds at a time  Aggravating factors: Induced by position change: laying down at night and Worse in the morning  Relieving factors: no known relieving factors; symptoms go away pretty quick   Progression of symptoms: unchanged  OCULOMOTOR EXAM:  Ocular Alignment: normal  Ocular ROM: No Limitations; pt reported blurriness when transitioning from the left to the right  Spontaneous Nystagmus: absent  Gaze-Induced Nystagmus: absent  Smooth Pursuits: intact    VESTIBULAR - OCULAR REFLEX:   Head-Impulse Test: HIT Right: positive HIT Left: negative    POSITIONAL TESTING: Right Dix-Hallpike: upbeating, right nystagmus Left Dix-Hallpike: no nystagmus                                                                                                       TREATMENT DATE:  04/26/24: PT evaluation and patient education  PATIENT EDUCATION: Education details: POC, prognosis, plan for next visit, goal for physical therapy, Epley maneuver, and objective findings Person educated: Patient Education method: Explanation Education comprehension: verbalized understanding  HOME EXERCISE PROGRAM:  GOALS: Goals reviewed with patient? Yes  LONG TERM GOALS: Target date:  05/24/24  Patient will be independent with her HEP.  Baseline:  Goal status: INITIAL  2.  Patient will be transfer from sitting to supine without reproducing her symptoms to be able to lay down at night to sleep.  Baseline:  Goal status: INITIAL  3.  Patient will improve her ABC scale by at least 15% for improved perceived function with her daily activities.  Baseline:  Goal status: INITIAL  ASSESSMENT:  CLINICAL IMPRESSION: Patient is a 58 y.o. female who was seen today for physical therapy evaluation and treatment for dizziness and unsteadiness on her feet. She was positive for right posterior canal BPPV as evidenced by her positive Dix-Halpike as this also reproduce her familiar symptoms. She was educated on how this was treated in physical therapy and the plan for her next appointment. She reported understanding. Recommend that she continue with skilled physical therapy to address her impairments to maximize her safety and functional mobility.     OBJECTIVE IMPAIRMENTS: decreased activity tolerance, decreased balance, difficulty walking, and dizziness.   ACTIVITY LIMITATIONS: bed mobility and locomotion level  PARTICIPATION LIMITATIONS: community activity  PERSONAL FACTORS: Past/current experiences, Time since onset of injury/illness/exacerbation, and 3+ comorbidities: asthma and memory deficits are also affecting patient's functional outcome.   REHAB POTENTIAL: Good  CLINICAL DECISION MAKING: Stable/uncomplicated  EVALUATION COMPLEXITY: Low   PLAN:  PT FREQUENCY: 1-2x/week  PT DURATION: 4 weeks  PLANNED INTERVENTIONS: 97164- PT Re-evaluation, 97750- Physical Performance Testing, 97110-Therapeutic exercises, 97530- Therapeutic activity, [id number]- Neuromuscular re-education, 97535- Self Care, [id number]- Manual therapy, [id number]- Gait training, [phone]- Canalith repositioning, Patient/Family education, Balance training, and Vestibular training  PLAN FOR NEXT SESSION: Epley for R  posterior canal, orthostatics, 2 MWT,  DGI, gait training, and balance interventions   Lacinda JAYSON Fass, PT 04/26/2024, 5:44 PM

## 2024-04-27 DIAGNOSIS — R002 Palpitations: Secondary | ICD-10-CM | POA: Diagnosis not present

## 2024-05-01 ENCOUNTER — Ambulatory Visit
Admission: EM | Admit: 2024-05-01 | Discharge: 2024-05-01 | Disposition: A | Discharge status: Home / Self Care | Attending: Nurse Practitioner | Admitting: Nurse Practitioner

## 2024-05-01 DIAGNOSIS — R21 Rash and other nonspecific skin eruption: Secondary | ICD-10-CM

## 2024-05-01 MED ORDER — VALACYCLOVIR HCL 1 G PO TABS: 1000.0000 mg | ORAL_TABLET | Freq: Three times a day (TID) | ORAL | 0 refills | Status: AC

## 2024-05-01 NOTE — ED Provider Notes (Signed)
 [facility]    CSN: [id number] Arrival date & time: 05/01/24  0920      History   Chief Complaint Chief Complaint  Patient presents with   Rash    HPI Nancy [name] is a 58 y.o. female.   The history is provided by the patient.   Patient presents for complaints of a rash on her right side and right abdomen.  Patient also reports pain under the right arm.  She states that the pain under her right arm is described as a stabbing pain.  She states that she does have a red line that extends from under the arm down to her right abdomen.  She also has a small cluster of red marks to her abdomen.  Patient states that the areas are not itchy, or painful.  She reports that she does have a history of shingles.  She states that she is concerned that she may be developing shingles.  She denies exposure to new soaps, medications, lotions, foods, or detergents.  She further denies fever, or chills.  Patient states that she has had shingles 3 times prior, she has not received the shingles vaccine.  Past Medical History:  Diagnosis Date   Allergic rhinitis    Asthma    Benign familial tremor    Bipolar 1 disorder (HCC)    Chronic neck and back pain    Degenerative disk disease    Depression    DJD (degenerative joint disease), lumbar    Dyslipidemia    Fibromyalgia    GERD (gastroesophageal reflux disease)    Hiatal hernia    History of kidney stones    Irritable bowel syndrome (IBS)    Memory disorder 07/18/2014   Memory loss    Migraine headache    Nephrolithiasis    Pneumonia 1986   Serotonin syndrome    Tremor    hands,arms    Patient Active Problem List   Diagnosis Date Noted   Headache, migraine 10/31/2021   Acute medial meniscus tear of left knee    GERD (gastroesophageal reflux disease) 06/13/2021   Allergic rhinitis 12/30/2019   Degenerative lumbar disc 11/23/2018   Preoperative clearance 11/09/2018   Memory disorder 07/18/2014   Severe persistent  asthma with acute bronchitis and acute exacerbation (HCC) 03/04/2013   Exercise-induced asthma 07/30/2012   Severe persistent asthma in adult without complication (HCC) 03/12/2011    Past Surgical History:  Procedure Laterality Date   APPENDECTOMY     back fusion     BACK SURGERY     BIOPSY  07/07/2023   Procedure: BIOPSY;  Surgeon: Cindie Carlin POUR, DO;  Location: [facility];  Service: Endoscopy;;   BRONCHOSCOPY     S. aureus from BAL   CARDIAC CATHETERIZATION  11/27/2006   minimal CAD   CHOLECYSTECTOMY     COLONOSCOPY WITH PROPOFOL  N/A 12/12/2020   Procedure: COLONOSCOPY WITH PROPOFOL ;  Surgeon: Golda Claudis PENNER, MD;  Location: [facility];  Service: Endoscopy;  Laterality: N/A;  820   ESOPHAGEAL DILATION  08/06/2004   ESOPHAGOGASTRODUODENOSCOPY (EGD) WITH PROPOFOL  N/A 07/07/2023   Procedure: ESOPHAGOGASTRODUODENOSCOPY (EGD) WITH PROPOFOL ;  Surgeon: Cindie Carlin POUR, DO;  Location: [facility];  Service: Endoscopy;  Laterality: N/A;  200pm, asa 2   KNEE ARTHROSCOPY WITH MEDIAL MENISECTOMY Left 08/09/2021   Procedure: KNEE ARTHROSCOPY WITH PARTIAL MEDIAL MENISCECTOMY;  Surgeon: Margrette Taft BRAVO, MD;  Location: [facility];  Service: Orthopedics;  Laterality: Left;   LAPAROSCOPIC CHOLECYSTECTOMY  LASER ABLATION OF THE CERVIX     MICRODISSECTION L5-S1  08/13/2000   neck fusion     TONSILLECTOMY      OB History   No obstetric history on file.      Home Medications    Prior to Admission medications   Medication Sig Start Date End Date Taking? Authorizing Provider  albuterol  (PROVENTIL ) (2.5 MG/3ML) 0.083% nebulizer solution Take 2.5 mg by nebulization every 6 (six) hours as needed for shortness of breath or wheezing.    [provider]  Ascorbic Acid (VITAMIN C) 1000 MG tablet Take 1,000 mg by mouth daily.    [provider]  atorvastatin (LIPITOR) 10 MG tablet Take 10 mg by mouth daily.    [provider]  BLACK ELDERBERRY PO Take 1 each by  mouth daily. Elderberry gummy    [provider]  BREZTRI  AEROSPHERE 160-9-4.8 MCG/ACT AERO INHALE 2 PUFFS INTO THE LUNGS IN THE MORNING AND AT BEDTIME. 08/28/22   Sood, Vineet, MD  carbamazepine  (TEGRETOL ) 100 MG chewable tablet Chew 300 mg by mouth at bedtime. 11/23/20   [provider]  cetirizine (ZYRTEC) 10 MG tablet Take 10 mg by mouth daily.    [provider]  Cholecalciferol (VITAMIN D3) 50 MCG (2000 UT) capsule Take 2,000 Units by mouth daily.    [provider]  ciprofloxacin  (CIPRO ) 500 MG tablet Take 1 tablet (500 mg total) by mouth 2 (two) times daily. One po bid x 7 days 09/27/23   Zammit, Joseph, MD  Cyanocobalamin (VITAMIN B-12) 2500 MCG SUBL Place 2,500 mcg under the tongue daily.    [provider]  cyclobenzaprine (FLEXERIL) 10 MG tablet Take 10 mg by mouth at bedtime.    [provider]  dicyclomine  (BENTYL ) 20 MG tablet Take 1 every 8 hours as needed for abdominal cramps 09/27/23   Suzette Pac, MD  docusate sodium (COLACE) 50 MG capsule Take 150 mg by mouth 2 (two) times daily.    [provider]  EPINEPHrine  0.3 mg/0.3 mL IJ SOAJ injection Inject 0.3 mg into the muscle as needed for anaphylaxis.    [provider]  estradiol (ESTRACE) 0.1 MG/GM vaginal cream Place vaginally. 11/12/22   [provider]  famotidine  (PEPCID ) 20 MG tablet Take 0.5 tablets (10 mg total) by mouth 2 (two) times daily. 06/23/23   Odell Balls, PA-C  gabapentin (NEURONTIN) 300 MG capsule Take 300 mg by mouth at bedtime.    [provider]  lamoTRIgine (LAMICTAL) 150 MG tablet Take 300 mg by mouth at bedtime.    [provider]  lisinopril (ZESTRIL) 5 MG tablet Take 1 tablet by mouth daily. 10/01/22   [provider]  meloxicam (MOBIC) 15 MG tablet Take 15 mg by mouth daily. 05/19/20   [provider]  Mepolizumab  (NUCALA ) 100 MG/ML SOAJ Inject 1 mL (100 mg total) into the skin every 28  (twenty-eight) days. 02/05/23   Sood, Vineet, MD  methocarbamol (ROBAXIN) 500 MG tablet Take 500 mg by mouth 2 (two) times daily. 10/19/22   [provider]  montelukast (SINGULAIR) 10 MG tablet Take 10 mg by mouth at bedtime.    [provider]  ondansetron  (ZOFRAN -ODT) 4 MG disintegrating tablet 4mg  ODT q4 hours prn nausea/vomit 09/27/23   Zammit, Joseph, MD  oxyCODONE -acetaminophen  (PERCOCET/ROXICET) 5-325 MG tablet Take 1 tablet by mouth every 6 (six) hours as needed for severe pain (pain score 7-10). 06/23/23   Odell Balls, PA-C  pantoprazole  (PROTONIX ) 40  MG tablet Take 1 tablet (40 mg total) by mouth 2 (two) times daily. 09/02/23 09/01/24  Cindie Carlin POUR, DO  primidone  (MYSOLINE ) 50 MG tablet Take 1 tablet (50 mg total) by mouth 3 (three) times daily. 08/10/23   Lomax, Amy, NP  Prucalopride Succinate  (MOTEGRITY ) 2 MG TABS Take 1 tablet (2 mg total) by mouth daily. 12/31/23 12/30/24  Cindie Carlin POUR, DO  rizatriptan  (MAXALT -MLT) 10 MG disintegrating tablet TAKE 1 TABLET BY MOUTH AS NEEDED FOR MIGRAINE. MAY REPEAT IN 2 HOURS IF NEEDED 11/18/23   Penumalli, Vikram R, MD  topiramate  (TOPAMAX ) 50 MG tablet Take 1 tablet (50 mg total) by mouth 2 (two) times daily. 08/04/23   Lomax, Amy, NP    Family History Family History  Problem Relation Age of Onset   Allergies Mother    Clotting disorder Mother    Diabetes Mother    Allergies Father    Heart disease Father    Heart attack Father    Diabetes Father    Allergies Brother    Chorea Maternal Grandmother     Social History Social History   Tobacco Use   Smoking status: Former    Current packs/day: 0.00    Average packs/day: 0.5 packs/day for 8.0 years (4.0 ttl pk-yrs)    Types: Cigarettes    Start date: 06/09/1970    Quit date: 06/09/1978    Years since quitting: 45.9   Smokeless tobacco: Never  Vaping Use   Vaping status: Never Used  Substance Use Topics   Alcohol  use: No   Drug use: No     Allergies    Cymbalta [duloxetine hcl], Divalproex sodium, Effexor [venlafaxine hydrochloride], Geodon [ziprasidone hydrochloride], Prozac [fluoxetine hcl], Thorazine [chlorpromazine hcl], Cephalexin, Cephalosporins, Codeine, Morphine , Theo-dur [theophylline], and Ziprasidone   Review of Systems Review of Systems Per HPI  Physical Exam Triage Vital Signs ED Triage Vitals  Encounter Vitals Group     BP 05/01/24 1004 133/84     Girls Systolic BP Percentile --      Girls Diastolic BP Percentile --      Boys Systolic BP Percentile --      Boys Diastolic BP Percentile --      Pulse Rate 05/01/24 1004 71     Resp 05/01/24 1004 20     Temp 05/01/24 1004 97.7 F (36.5 C)     Temp Source 05/01/24 1004 Oral     SpO2 05/01/24 1004 95 %     Weight --      Height --      Head Circumference --      Peak Flow --      Pain Score 05/01/24 1003 2     Pain Loc --      Pain Education --      Exclude from Growth Chart --    No data found.  Updated Vital Signs BP 133/84 (BP Location: Right Arm)   Pulse 71   Temp 97.7 F (36.5 C) (Oral)   Resp 20   LMP 11/28/2013 Comment: IUD  SpO2 95%   Visual Acuity Right Eye Distance:   Left Eye Distance:   Bilateral Distance:    Right Eye Near:   Left Eye Near:    Bilateral Near:     Physical Exam Vitals and nursing note reviewed.  Constitutional:      General: She is not in acute distress.    Appearance: Normal appearance.  HENT:     Head: Normocephalic.  Eyes:  Extraocular Movements: Extraocular movements intact.     Pupils: Pupils are equal, round, and reactive to light.  Cardiovascular:     Rate and Rhythm: Normal rate and regular rhythm.     Pulses: Normal pulses.     Heart sounds: Normal heart sounds.  Pulmonary:     Effort: Pulmonary effort is normal. No respiratory distress.     Breath sounds: Normal breath sounds. No stridor. No wheezing, rhonchi or rales.  Abdominal:     General: Bowel sounds are normal.     Palpations: Abdomen is  soft.  Musculoskeletal:     Cervical back: Normal range of motion.  Lymphadenopathy:     Upper Body:     Right upper body: Axillary adenopathy present.  Skin:    General: Skin is warm and dry.     Findings: Erythema present.     Comments: A singular red streak is noted to the right side under the right axilla.  Cluster of red streaks are noted to the right lower abdomen.  The areas are not raised, blistering, pustular, papular, or vesicular.  There is no oozing, fluctuance, or drainage.  Neurological:     General: No focal deficit present.     Mental Status: She is alert and oriented to person, place, and time.  Psychiatric:        Mood and Affect: Mood normal.        Behavior: Behavior normal.      UC Treatments / Results  Labs (all labs ordered are listed, but only abnormal results are displayed) Labs Reviewed - No data to display  EKG   Radiology No results found.  Procedures Procedures (including critical care time)  Medications Ordered in UC Medications - No data to display  Initial Impression / Assessment and Plan / UC Course  I have reviewed the triage vital signs and the nursing notes.  Pertinent labs & imaging results that were available during my care of the patient were reviewed by me and considered in my medical decision making (see chart for details).  Patient presents for complaints of a rash to her right side.  She also reports tenderness under the right axilla.  On exam, she does have axillary lymphadenopathy, and a line of erythema that extends down the right side with a small cluster of red streaks underneath.  Difficult to determine the cause of the patient's symptoms at this time; low likelihood for shingles; however, given the patient's recurrence and uncertainty with her presentation today, will treat with Valtrex  1000 mg.  Supportive care recommendations were provided and discussed with the patient to include over-the-counter analgesics, cool  compresses to the area, and to monitor for worsening.  Patient was advised to follow-up tomorrow for reevaluation of the rash.  Patient was in agreement with this plan of care and verbalizes understanding.  All questions were answered.  Patient stable for discharge.   Final Clinical Impressions(s) / UC Diagnoses   Final diagnoses:  None   Discharge Instructions   None    ED Prescriptions   None    PDMP not reviewed this encounter.   Gilmer Etta PARAS, NP 05/01/24 1041

## 2024-05-01 NOTE — Discharge Instructions (Signed)
 I have provided you with a prescription for Valtrex  for possible shingles.  As discussed, the rash currently does not present as shingles, but symptoms are new.  I would like for you to follow-up tomorrow morning if you are able for reevaluation. You may take over-the-counter Tylenol  as needed for pain or discomfort. Apply warm compresses under the right arm to help with pain or discomfort. Follow-up as needed.

## 2024-05-01 NOTE — ED Triage Notes (Signed)
 Pt reports she has a red rash like area on her right side of abdomen and hip area  since this morning.  Reports pain to right axilla

## 2024-05-03 ENCOUNTER — Ambulatory Visit (HOSPITAL_COMMUNITY)

## 2024-05-04 ENCOUNTER — Encounter (HOSPITAL_COMMUNITY): Payer: Self-pay

## 2024-05-04 ENCOUNTER — Ambulatory Visit (HOSPITAL_COMMUNITY)

## 2024-05-04 DIAGNOSIS — R42 Dizziness and giddiness: Secondary | ICD-10-CM

## 2024-05-04 DIAGNOSIS — R2681 Unsteadiness on feet: Secondary | ICD-10-CM

## 2024-05-04 NOTE — Therapy (Signed)
 OUTPATIENT PHYSICAL THERAPY VESTIBULAR TREATMENT      Patient Name: Nancy [name] MRN: [id number] DOB:[birth year], 58 y.o., female Today's Date: 05/04/2024  END OF SESSION:  PT End of Session - 05/04/24 0815     Visit Number 2    Number of Visits 8    Date for Recertification  07/08/24    Authorization Type [employer]    Authorization Time Period no authorization required    PT Start Time 0815    PT Stop Time 0857    PT Time Calculation (min) 42 min    Activity Tolerance Patient tolerated treatment well    Behavior During Therapy [facility] for tasks assessed/performed           Past Medical History:  Diagnosis Date   Allergic rhinitis    Asthma    Benign familial tremor    Bipolar 1 disorder (HCC)    Chronic neck and back pain    Degenerative disk disease    Depression    DJD (degenerative joint disease), lumbar    Dyslipidemia    Fibromyalgia    GERD (gastroesophageal reflux disease)    Hiatal hernia    History of kidney stones    Irritable bowel syndrome (IBS)    Memory disorder 07/18/2014   Memory loss    Migraine headache    Nephrolithiasis    Pneumonia 1986   Serotonin syndrome    Tremor    hands,arms   Past Surgical History:  Procedure Laterality Date   APPENDECTOMY     back fusion     BACK SURGERY     BIOPSY  07/07/2023   Procedure: BIOPSY;  Surgeon: Cindie Carlin POUR, DO;  [facility];  Service: Endoscopy;;   BRONCHOSCOPY     S. aureus from BAL   CARDIAC CATHETERIZATION  11/27/2006   minimal CAD   CHOLECYSTECTOMY     COLONOSCOPY WITH PROPOFOL  N/A 12/12/2020   Procedure: COLONOSCOPY WITH PROPOFOL ;  Surgeon: Golda Claudis PENNER, MD;  [facility];  Service: Endoscopy;  Laterality: N/A;  820   ESOPHAGEAL DILATION  08/06/2004   ESOPHAGOGASTRODUODENOSCOPY (EGD) WITH PROPOFOL  N/A 07/07/2023   Procedure: ESOPHAGOGASTRODUODENOSCOPY (EGD) WITH PROPOFOL ;  Surgeon: Cindie Carlin POUR, DO;  [facility];  Service:  Endoscopy;  Laterality: N/A;  200pm, asa 2   KNEE ARTHROSCOPY WITH MEDIAL MENISECTOMY Left 08/09/2021   Procedure: KNEE ARTHROSCOPY WITH PARTIAL MEDIAL MENISCECTOMY;  Surgeon: Margrette Taft BRAVO, MD;  Location: [facility];  Service: Orthopedics;  Laterality: Left;   LAPAROSCOPIC CHOLECYSTECTOMY     LASER ABLATION OF THE CERVIX     MICRODISSECTION L5-S1  08/13/2000   neck fusion     TONSILLECTOMY     Patient Active Problem List   Diagnosis Date Noted   Headache, migraine 10/31/2021   Acute medial meniscus tear of left knee    GERD (gastroesophageal reflux disease) 06/13/2021   Allergic rhinitis 12/30/2019   Degenerative lumbar disc 11/23/2018   Preoperative clearance 11/09/2018   Memory disorder 07/18/2014   Severe persistent asthma with acute bronchitis and acute exacerbation (HCC) 03/04/2013   Exercise-induced asthma 07/30/2012   Severe persistent asthma in adult without complication (HCC) 03/12/2011    PCP: Rosamond Leta NOVAK, MD  REFERRING PROVIDER: Tobie Eldora NOVAK, MD   REFERRING DIAG: Imbalance   THERAPY DIAG:  Dizziness and giddiness  Unsteadiness on feet  ONSET DATE: 4-5 months ago  Rationale for Evaluation and Treatment: Rehabilitation  SUBJECTIVE:   SUBJECTIVE STATEMENT: Patient  reports that her head feels funny today. She feels like it has gotten worse since her last appointment. She also feels like her body wants to rock as well.   Eval: Patient reports that she was told that she had vertigo about 4-5 months ago, but she felt like the room was shaking and not spinning. She had tried medication, but this did not help any. Valium  was the only thing that really helped. She has noticed that her head will want to spin when she lays down at night. She will also feel like a drunk person when she wakes up in the morning.  Pt accompanied by: self  PERTINENT HISTORY: asthma and memory deficits  PAIN:  Are you having pain? No  PRECAUTIONS: None  RED  FLAGS: None   WEIGHT BEARING RESTRICTIONS: No  FALLS: Has patient fallen in last 6 months? No  LIVING ENVIRONMENT: Lives with: lives with their spouse Lives in: House/apartment Stairs: Yes: External: 3 steps; on left going up; reciprocal pattern  Has following equipment at home: None  PLOF: Independent  PATIENT GOALS: resolve her symptoms  OBJECTIVE:  Note: Objective measures were completed at Evaluation unless otherwise noted.  COGNITION: Overall cognitive status: Within functional limits for tasks assessed   SENSATION: Patient reports no numbness or tingling  Cervical ROM:    Active A/PROM (deg) eval  Flexion WFL  Extension WFL   Right lateral flexion 75% limited   Left lateral flexion 75% limited  Right rotation WFL   Left rotation WFL   (Blank rows = not tested)  LOWER EXTREMITY MMT:   MMT Right eval Left eval  Hip flexion 4-/5 4-/5  Hip abduction    Hip adduction    Hip internal rotation    Hip external rotation    Knee flexion 4+/5 4+/5  Knee extension 4+/5 4+/5  Ankle dorsiflexion 4+/5 4+/5  Ankle plantarflexion    Ankle inversion    Ankle eversion    (Blank rows = not tested)  BED MOBILITY:  Sit to supine Complete Independence Supine to sit Complete Independence  TRANSFERS: Assistive device utilized: None  Sit to stand: Complete Independence Stand to sit: Complete Independence  PATIENT SURVEYS:  ABC scale: The Activities-Specific Balance Confidence (ABC) Scale 0% 10 20 30  40 50 60 70 80 90 100% No confidence<->completely confident  "How confident are you that you will not lose your balance or become unsteady when you . . .   Date tested 04/26/24  Walk around the house 100%  2. Walk up or down stairs 100%  3. Bend over and pick up a slipper from in front of a closet floor 100%  4. Reach for a small can off a shelf at eye level 100%  5. Stand on tip toes and reach for something above your head 50%  6. Stand on a chair and reach for  something 0%  7. Sweep the floor 100%  8. Walk outside the house to a car parked in the driveway 100%  9. Get into or out of a car 100%  10. Walk across a parking lot to the mall 100%  11. Walk up or down a ramp 100%  12. Walk in a crowded mall where people rapidly walk past you 100%  13. Are bumped into by people as you walk through the mall 100%  14. Step onto or off of an escalator while you are holding onto the railing 25%  15. Step onto or off an escalator while holding  onto parcels such that you cannot hold onto the railing 0%  16. Walk outside on icy sidewalks 25%  Total: #/16  75%     VESTIBULAR ASSESSMENT:  GENERAL OBSERVATION: no LOB with ambulation   SYMPTOM BEHAVIOR:  Subjective history: I've been bouncing off furniture for a long time now (years).   Non-Vestibular symptoms: headaches and migraine symptoms  Type of dizziness: room moves side to side  Frequency: multiple times every day  Duration: few seconds at a time  Aggravating factors: Induced by position change: laying down at night and Worse in the morning  Relieving factors: no known relieving factors; symptoms go away pretty quick   Progression of symptoms: unchanged  OCULOMOTOR EXAM:  Ocular Alignment: normal  Ocular ROM: No Limitations; pt reported blurriness when transitioning from the left to the right  Spontaneous Nystagmus: absent  Gaze-Induced Nystagmus: absent  Smooth Pursuits: intact    VESTIBULAR - OCULAR REFLEX:   Head-Impulse Test: HIT Right: positive HIT Left: negative    POSITIONAL TESTING: Right Dix-Hallpike: upbeating, right nystagmus Left Dix-Hallpike: no nystagmus                                                                                                       TREATMENT DATE:                                    05/04/24 EXERCISE LOG  Exercise Repetitions and Resistance Comments  Vitals  BP: 110/69 HR: 81 bpm  SpO2: 96% Seated   Epley maneuver   X2  For R posterior canal  2  minute walk test  365 feet    VOR x 1   10 reps each  Added to HEP (patient declined handout); w/ arm and head horizontal movement  Patient education  See below     Blank cell = exercise not performed today   04/26/24: PT evaluation and patient education  PATIENT EDUCATION: Education details: symptoms, healing, and goals for physical therapy  Person educated: Patient Education method: Explanation Education comprehension: verbalized understanding  HOME EXERCISE PROGRAM:  GOALS: Goals reviewed with patient? Yes  LONG TERM GOALS: Target date: 05/24/24  Patient will be independent with her HEP.  Baseline:  Goal status: INITIAL  2.  Patient will be transfer from sitting to supine without reproducing her symptoms to be able to lay down at night to sleep.  Baseline:  Goal status: INITIAL  3.  Patient will improve her ABC scale by at least 15% for improved perceived function with her daily activities.  Baseline:  Goal status: INITIAL  ASSESSMENT:  CLINICAL IMPRESSION: Patient presented to treatment with a slight increase in her familiar symptoms since her last appointment. She experienced a slight improvement when completing the epley maneuver for the second repetition. Her two minute walk test was assessed and she was able to ambulate 365 feet. She exhibited increased unsteadiness while walking, but she felt that this was her normal.  She reported feeling alright upon the conclusion  of treatment. Patient continues to require skilled physical therapy to address her remaining impairments to return to her prior level of function.     Eval: Patient is a 59 y.o. female who was seen today for physical therapy evaluation and treatment for dizziness and unsteadiness on her feet. She was positive for right posterior canal BPPV as evidenced by her positive Dix-Halpike as this also reproduce her familiar symptoms. She was educated on how this was treated in physical therapy and the plan for her  next appointment. She reported understanding. Recommend that she continue with skilled physical therapy to address her impairments to maximize her safety and functional mobility.     OBJECTIVE IMPAIRMENTS: decreased activity tolerance, decreased balance, difficulty walking, and dizziness.   ACTIVITY LIMITATIONS: bed mobility and locomotion level  PARTICIPATION LIMITATIONS: community activity  PERSONAL FACTORS: Past/current experiences, Time since onset of injury/illness/exacerbation, and 3+ comorbidities: asthma and memory deficits are also affecting patient's functional outcome.   REHAB POTENTIAL: Good  CLINICAL DECISION MAKING: Stable/uncomplicated  EVALUATION COMPLEXITY: Low   PLAN:  PT FREQUENCY: 1-2x/week  PT DURATION: 4 weeks  PLANNED INTERVENTIONS: [id number]- PT Re-evaluation, 97750- Physical Performance Testing, 97110-Therapeutic exercises, 97530- Therapeutic activity, [id number]- Neuromuscular re-education, 97535- Self Care, [id number]- Manual therapy, [phone]- Gait training, [phone]- Canalith repositioning, Patient/Family education, Balance training, and Vestibular training  PLAN FOR NEXT SESSION: Epley for R posterior canal, orthostatics, DGI, gait training, and balance interventions   Lacinda JAYSON Fass, PT 05/04/2024, 11:12 AM

## 2024-05-10 DIAGNOSIS — R002 Palpitations: Secondary | ICD-10-CM | POA: Diagnosis not present

## 2024-05-11 ENCOUNTER — Ambulatory Visit (HOSPITAL_COMMUNITY): Attending: Otolaryngology

## 2024-05-11 DIAGNOSIS — R42 Dizziness and giddiness: Secondary | ICD-10-CM | POA: Insufficient documentation

## 2024-05-11 DIAGNOSIS — R2681 Unsteadiness on feet: Secondary | ICD-10-CM | POA: Insufficient documentation

## 2024-05-11 NOTE — Therapy (Signed)
 OUTPATIENT PHYSICAL THERAPY VESTIBULAR TREATMENT      Patient Name: Nancy [name] MRN: [id number] DOB:May 08, 1966, 58 y.o., female Today's Date: 05/11/2024  END OF SESSION:  PT End of Session - 05/11/24 0908     Visit Number 3    Number of Visits 8    Date for Recertification  07/08/24    Authorization Type [employer]    Authorization Time Period no authorization required    PT Start Time 0906    PT Stop Time 0945    PT Time Calculation (min) 39 min    Activity Tolerance Patient tolerated treatment well    Behavior During Therapy [facility] for tasks assessed/performed           Past Medical History:  Diagnosis Date   Allergic rhinitis    Asthma    Benign familial tremor    Bipolar 1 disorder (HCC)    Chronic neck and back pain    Degenerative disk disease    Depression    DJD (degenerative joint disease), lumbar    Dyslipidemia    Fibromyalgia    GERD (gastroesophageal reflux disease)    Hiatal hernia    History of kidney stones    Irritable bowel syndrome (IBS)    Memory disorder 07/18/2014   Memory loss    Migraine headache    Nephrolithiasis    Pneumonia 1986   Serotonin syndrome    Tremor    hands,arms   Past Surgical History:  Procedure Laterality Date   APPENDECTOMY     back fusion     BACK SURGERY     BIOPSY  07/07/2023   Procedure: BIOPSY;  Surgeon: Cindie Carlin POUR, DO;  Location: [facility];  Service: Endoscopy;;   BRONCHOSCOPY     S. aureus from BAL   CARDIAC CATHETERIZATION  11/27/2006   minimal CAD   CHOLECYSTECTOMY     COLONOSCOPY WITH PROPOFOL  N/A 12/12/2020   Procedure: COLONOSCOPY WITH PROPOFOL ;  Surgeon: Golda Claudis PENNER, MD;  Location: [facility];  Service: Endoscopy;  Laterality: N/A;  820   ESOPHAGEAL DILATION  08/06/2004   ESOPHAGOGASTRODUODENOSCOPY (EGD) WITH PROPOFOL  N/A 07/07/2023   Procedure: ESOPHAGOGASTRODUODENOSCOPY (EGD) WITH PROPOFOL ;  Surgeon: Cindie Carlin POUR, DO;  Location: [facility];  Service:  Endoscopy;  Laterality: N/A;  200pm, asa 2   KNEE ARTHROSCOPY WITH MEDIAL MENISECTOMY Left 08/09/2021   Procedure: KNEE ARTHROSCOPY WITH PARTIAL MEDIAL MENISCECTOMY;  Surgeon: Margrette Taft BRAVO, MD;  Location: [facility];  Service: Orthopedics;  Laterality: Left;   LAPAROSCOPIC CHOLECYSTECTOMY     LASER ABLATION OF THE CERVIX     MICRODISSECTION L5-S1  08/13/2000   neck fusion     TONSILLECTOMY     Patient Active Problem List   Diagnosis Date Noted   Headache, migraine 10/31/2021   Acute medial meniscus tear of left knee    GERD (gastroesophageal reflux disease) 06/13/2021   Allergic rhinitis 12/30/2019   Degenerative lumbar disc 11/23/2018   Preoperative clearance 11/09/2018   Memory disorder 07/18/2014   Severe persistent asthma with acute bronchitis and acute exacerbation (HCC) 03/04/2013   Exercise-induced asthma 07/30/2012   Severe persistent asthma in adult without complication (HCC) 03/12/2011    PCP: Rosamond Leta NOVAK, MD  REFERRING PROVIDER: Tobie Eldora NOVAK, MD   REFERRING DIAG: Imbalance   THERAPY DIAG:  Dizziness and giddiness  Unsteadiness on feet  ONSET DATE: 4-5 months ago  Rationale for Evaluation and Treatment: Rehabilitation  SUBJECTIVE:   SUBJECTIVE STATEMENT: Patient  reports no spinning; just off balance; feeling more off balance since maneuver last couple treatments but no spinning.  Seems like her car sickness tendency has gotten worse over the years.I always drive Has had some issues with tremors over the years and sees a neurologist for that.  Eval: Patient reports that she was told that she had vertigo about 4-5 months ago, but she felt like the room was shaking and not spinning. She had tried medication, but this did not help any. Valium  was the only thing that really helped. She has noticed that her head will want to spin when she lays down at night. She will also feel like a drunk person when she wakes up in the morning.  Pt accompanied by:  self  PERTINENT HISTORY: asthma and memory deficits  PAIN:  Are you having pain? No  PRECAUTIONS: None  RED FLAGS: None   WEIGHT BEARING RESTRICTIONS: No  FALLS: Has patient fallen in last 6 months? No  LIVING ENVIRONMENT: Lives with: lives with their spouse Lives in: House/apartment Stairs: Yes: External: 3 steps; on left going up; reciprocal pattern  Has following equipment at home: None  PLOF: Independent  PATIENT GOALS: resolve her symptoms  OBJECTIVE:  Note: Objective measures were completed at Evaluation unless otherwise noted.  COGNITION: Overall cognitive status: Within functional limits for tasks assessed   SENSATION: Patient reports no numbness or tingling  Cervical ROM:    Active A/PROM (deg) eval  Flexion WFL  Extension WFL   Right lateral flexion 75% limited   Left lateral flexion 75% limited  Right rotation WFL   Left rotation WFL   (Blank rows = not tested)  LOWER EXTREMITY MMT:   MMT Right eval Left eval  Hip flexion 4-/5 4-/5  Hip abduction    Hip adduction    Hip internal rotation    Hip external rotation    Knee flexion 4+/5 4+/5  Knee extension 4+/5 4+/5  Ankle dorsiflexion 4+/5 4+/5  Ankle plantarflexion    Ankle inversion    Ankle eversion    (Blank rows = not tested)  BED MOBILITY:  Sit to supine Complete Independence Supine to sit Complete Independence  TRANSFERS: Assistive device utilized: None  Sit to stand: Complete Independence Stand to sit: Complete Independence  PATIENT SURVEYS:  ABC scale: The Activities-Specific Balance Confidence (ABC) Scale 0% 10 20 30  40 50 60 70 80 90 100% No confidence<->completely confident  "How confident are you that you will not lose your balance or become unsteady when you . . .   Date tested 04/26/24  Walk around the house 100%  2. Walk up or down stairs 100%  3. Bend over and pick up a slipper from in front of a closet floor 100%  4. Reach for a small can off a shelf at eye  level 100%  5. Stand on tip toes and reach for something above your head 50%  6. Stand on a chair and reach for something 0%  7. Sweep the floor 100%  8. Walk outside the house to a car parked in the driveway 100%  9. Get into or out of a car 100%  10. Walk across a parking lot to the mall 100%  11. Walk up or down a ramp 100%  12. Walk in a crowded mall where people rapidly walk past you 100%  13. Are bumped into by people as you walk through the mall 100%  14. Step onto or off of an escalator  while you are holding onto the railing 25%  15. Step onto or off an escalator while holding onto parcels such that you cannot hold onto the railing 0%  16. Walk outside on icy sidewalks 25%  Total: #/16  75%     VESTIBULAR ASSESSMENT:  GENERAL OBSERVATION: no LOB with ambulation   SYMPTOM BEHAVIOR:  Subjective history: I've been bouncing off furniture for a long time now (years).   Non-Vestibular symptoms: headaches and migraine symptoms  Type of dizziness: room moves side to side  Frequency: multiple times every day  Duration: few seconds at a time  Aggravating factors: Induced by position change: laying down at night and Worse in the morning  Relieving factors: no known relieving factors; symptoms go away pretty quick   Progression of symptoms: unchanged  OCULOMOTOR EXAM:  Ocular Alignment: normal  Ocular ROM: No Limitations; pt reported blurriness when transitioning from the left to the right  Spontaneous Nystagmus: absent  Gaze-Induced Nystagmus: absent  Smooth Pursuits: intact    VESTIBULAR - OCULAR REFLEX:   Head-Impulse Test: HIT Right: positive HIT Left: negative    POSITIONAL TESTING: Right Dix-Hallpike: upbeating, right nystagmus Left Dix-Hallpike: no nystagmus                                                                                                       TREATMENT DATE:  05/11/24 AROM of cervical spine Gaze stabilization up and down and side to side no  issue Review of VOR exercise without issue Visual tracking and convergence noted increased involuntary neck movements; mild involuntary eye movement SLS unable either side Dix hallpike right side mild positive so performed Epley; no dizziness on sitting up. Tandem stance Updated HEP                                     05/04/24 EXERCISE LOG  Exercise Repetitions and Resistance Comments  Vitals  BP: 110/69 HR: 81 bpm  SpO2: 96% Seated   Epley maneuver   X2  For R posterior canal  2 minute walk test  365 feet    VOR x 1   10 reps each  Added to HEP (patient declined handout); w/ arm and head horizontal movement  Patient education  See below     Blank cell = exercise not performed today   04/26/24: PT evaluation and patient education  PATIENT EDUCATION: Education details: symptoms, healing, and goals for physical therapy  Person educated: Patient Education method: Explanation Education comprehension: verbalized understanding  HOME EXERCISE PROGRAM: Access Code: [email or web address] URL: [email or web address] Date: 05/11/2024 Prepared by: [facility]  Exercises - standing single leg balance at the counter (try to not hold on)  - 2 x daily - 7 x weekly - 1 sets - 3 reps - tandem stance balance; try not to hold on  - 2 x daily - 7 x weekly - 1 sets - 3 reps  GOALS: Goals reviewed with patient? Yes  LONG TERM GOALS: Target date: 05/24/24  Patient will be independent with her HEP.  Baseline:  Goal status: INITIAL  2.  Patient will be transfer from sitting to supine without reproducing her symptoms to be able to lay down at night to sleep.  Baseline:  Goal status: INITIAL  3.  Patient will improve her ABC scale by at least 15% for improved perceived function with her daily activities.  Baseline:  Goal status: INITIAL  ASSESSMENT:  CLINICAL IMPRESSION: AROM of cervical spine grossly wfl; noted involuntary neck movements at rest.  Tremors noted in hands right > left.   Tested balance today with patient unable to perform SLS without bilateral upper extremity support; she still had a mild positive dix hallpike on the right so performed Epley maneuver.  Updated HEP with balance interventions.  Discussed with her that if no improvement at next visit will consider d/c back to MD.  Patient will benefit from continued skilled therapy services to address deficits and promote return to optimal function.       Eval: Patient is a 58 y.o. female who was seen today for physical therapy evaluation and treatment for dizziness and unsteadiness on her feet. She was positive for right posterior canal BPPV as evidenced by her positive Dix-Halpike as this also reproduce her familiar symptoms. She was educated on how this was treated in physical therapy and the plan for her next appointment. She reported understanding. Recommend that she continue with skilled physical therapy to address her impairments to maximize her safety and functional mobility.     OBJECTIVE IMPAIRMENTS: decreased activity tolerance, decreased balance, difficulty walking, and dizziness.   ACTIVITY LIMITATIONS: bed mobility and locomotion level  PARTICIPATION LIMITATIONS: community activity  PERSONAL FACTORS: Past/current experiences, Time since onset of injury/illness/exacerbation, and 3+ comorbidities: asthma and memory deficits are also affecting patient's functional outcome.   REHAB POTENTIAL: Good  CLINICAL DECISION MAKING: Stable/uncomplicated  EVALUATION COMPLEXITY: Low   PLAN:  PT FREQUENCY: 1-2x/week  PT DURATION: 4 weeks  PLANNED INTERVENTIONS: 97164- PT Re-evaluation, 97750- Physical Performance Testing, 97110-Therapeutic exercises, 97530- Therapeutic activity, 97112- Neuromuscular re-education, 97535- Self Care, [id number]- Manual therapy, [phone]- Gait training, [phone]- Canalith repositioning, Patient/Family education, Balance training, and Vestibular training  PLAN FOR NEXT SESSION: Epley for R  posterior canal, orthostatics, DGI, gait training, and balance interventions  9:47 AM, 05/11/24 Anapaola Kinsel Small Samayra Hebel MPT [facility] [place] [phone] Ph:[phone]

## 2024-05-20 ENCOUNTER — Ambulatory Visit (HOSPITAL_COMMUNITY)

## 2024-05-20 ENCOUNTER — Encounter (HOSPITAL_COMMUNITY): Payer: Self-pay

## 2024-05-20 DIAGNOSIS — R2681 Unsteadiness on feet: Secondary | ICD-10-CM

## 2024-05-20 DIAGNOSIS — R42 Dizziness and giddiness: Secondary | ICD-10-CM | POA: Diagnosis not present

## 2024-05-20 NOTE — Therapy (Signed)
 OUTPATIENT PHYSICAL THERAPY VESTIBULAR TREATMENT      Patient Name: Nancy [name] MRN: [id number] DOB:[birth year], 58 y.o., female Today's Date: 05/20/2024  END OF SESSION:  PT End of Session - 05/20/24 1029     Visit Number 4    Number of Visits 8    Date for Recertification  07/08/24    Authorization Type [employer]    Authorization Time Period no authorization required    PT Start Time 1029    PT Stop Time 1110    PT Time Calculation (min) 41 min    Activity Tolerance Patient tolerated treatment well    Behavior During Therapy WFL for tasks assessed/performed            Past Medical History:  Diagnosis Date   Allergic rhinitis    Asthma    Benign familial tremor    Bipolar 1 disorder (HCC)    Chronic neck and back pain    Degenerative disk disease    Depression    DJD (degenerative joint disease), lumbar    Dyslipidemia    Fibromyalgia    GERD (gastroesophageal reflux disease)    Hiatal hernia    History of kidney stones    Irritable bowel syndrome (IBS)    Memory disorder 07/18/2014   Memory loss    Migraine headache    Nephrolithiasis    Pneumonia 1986   Serotonin syndrome    Tremor    hands,arms   Past Surgical History:  Procedure Laterality Date   APPENDECTOMY     back fusion     BACK SURGERY     BIOPSY  07/07/2023   Procedure: BIOPSY;  Surgeon: Cindie Carlin POUR, DO;  Location: [facility];  Service: Endoscopy;;   BRONCHOSCOPY     S. aureus from BAL   CARDIAC CATHETERIZATION  11/27/2006   minimal CAD   CHOLECYSTECTOMY     COLONOSCOPY WITH PROPOFOL  N/A 12/12/2020   Procedure: COLONOSCOPY WITH PROPOFOL ;  Surgeon: Golda Claudis PENNER, MD;  Location: [facility];  Service: Endoscopy;  Laterality: N/A;  820   ESOPHAGEAL DILATION  08/06/2004   ESOPHAGOGASTRODUODENOSCOPY (EGD) WITH PROPOFOL  N/A 07/07/2023   Procedure: ESOPHAGOGASTRODUODENOSCOPY (EGD) WITH PROPOFOL ;  Surgeon: Cindie Carlin POUR, DO;  Location: [facility];  Service:  Endoscopy;  Laterality: N/A;  200pm, asa 2   KNEE ARTHROSCOPY WITH MEDIAL MENISECTOMY Left 08/09/2021   Procedure: KNEE ARTHROSCOPY WITH PARTIAL MEDIAL MENISCECTOMY;  Surgeon: Margrette Taft BRAVO, MD;  Location: [facility];  Service: Orthopedics;  Laterality: Left;   LAPAROSCOPIC CHOLECYSTECTOMY     LASER ABLATION OF THE CERVIX     MICRODISSECTION L5-S1  08/13/2000   neck fusion     TONSILLECTOMY     Patient Active Problem List   Diagnosis Date Noted   Headache, migraine 10/31/2021   Acute medial meniscus tear of left knee    GERD (gastroesophageal reflux disease) 06/13/2021   Allergic rhinitis 12/30/2019   Degenerative lumbar disc 11/23/2018   Preoperative clearance 11/09/2018   Memory disorder 07/18/2014   Severe persistent asthma with acute bronchitis and acute exacerbation (HCC) 03/04/2013   Exercise-induced asthma 07/30/2012   Severe persistent asthma in adult without complication (HCC) 03/12/2011    PCP: Rosamond Leta NOVAK, MD  REFERRING PROVIDER: Tobie Eldora NOVAK, MD   REFERRING DIAG: Imbalance   THERAPY DIAG:  Dizziness and giddiness  Unsteadiness on feet  ONSET DATE: 4-5 months ago  Rationale for Evaluation and Treatment: Rehabilitation  SUBJECTIVE:   SUBJECTIVE STATEMENT:  Pt states she has not had any dizzy spells, pt states she does not have vertigo. Pt states she has not had any spinning since 3-4 months ago and even then it was side to side. Pt states the involuntary head bobbing has started after the vertigo episode, valium  took away side to side feeling and head bobbing.   Eval: Patient reports that she was told that she had vertigo about 4-5 months ago, but she felt like the room was shaking and not spinning. She had tried medication, but this did not help any. Valium  was the only thing that really helped. She has noticed that her head will want to spin when she lays down at night. She will also feel like a drunk person when she wakes up in the morning.  Pt  accompanied by: self  PERTINENT HISTORY: asthma and memory deficits  PAIN:  Are you having pain? No  PRECAUTIONS: None  RED FLAGS: None   WEIGHT BEARING RESTRICTIONS: No  FALLS: Has patient fallen in last 6 months? No  LIVING ENVIRONMENT: Lives with: lives with their spouse Lives in: House/apartment Stairs: Yes: External: 3 steps; on left going up; reciprocal pattern  Has following equipment at home: None  PLOF: Independent  PATIENT GOALS: resolve her symptoms  OBJECTIVE:  Note: Objective measures were completed at Evaluation unless otherwise noted.  COGNITION: Overall cognitive status: Within functional limits for tasks assessed   SENSATION: Patient reports no numbness or tingling  Cervical ROM:    Active A/PROM (deg) eval  Flexion WFL  Extension WFL   Right lateral flexion 75% limited   Left lateral flexion 75% limited  Right rotation WFL   Left rotation WFL   (Blank rows = not tested)  LOWER EXTREMITY MMT:   MMT Right eval Left eval  Hip flexion 4-/5 4-/5  Hip abduction    Hip adduction    Hip internal rotation    Hip external rotation    Knee flexion 4+/5 4+/5  Knee extension 4+/5 4+/5  Ankle dorsiflexion 4+/5 4+/5  Ankle plantarflexion    Ankle inversion    Ankle eversion    (Blank rows = not tested)  BED MOBILITY:  Sit to supine Complete Independence Supine to sit Complete Independence  TRANSFERS: Assistive device utilized: None  Sit to stand: Complete Independence Stand to sit: Complete Independence  PATIENT SURVEYS:  ABC scale: The Activities-Specific Balance Confidence (ABC) Scale 0% 10 20 30  40 50 60 70 80 90 100% No confidence<->completely confident  How confident are you that you will not lose your balance or become unsteady when you . . .   Date tested 04/26/24  Walk around the house 100%  2. Walk up or down stairs 100%  3. Bend over and pick up a slipper from in front of a closet floor 100%  4. Reach for a small can  off a shelf at eye level 100%  5. Stand on tip toes and reach for something above your head 50%  6. Stand on a chair and reach for something 0%  7. Sweep the floor 100%  8. Walk outside the house to a car parked in the driveway 100%  9. Get into or out of a car 100%  10. Walk across a parking lot to the mall 100%  11. Walk up or down a ramp 100%  12. Walk in a crowded mall where people rapidly walk past you 100%  13. Are bumped into by people as you walk through the  mall 100%  14. Step onto or off of an escalator while you are holding onto the railing 25%  15. Step onto or off an escalator while holding onto parcels such that you cannot hold onto the railing 0%  16. Walk outside on icy sidewalks 25%  Total: #/16  75%     VESTIBULAR ASSESSMENT:  GENERAL OBSERVATION: no LOB with ambulation   SYMPTOM BEHAVIOR:  Subjective history: I've been bouncing off furniture for a long time now (years).   Non-Vestibular symptoms: headaches and migraine symptoms  Type of dizziness: room moves side to side  Frequency: multiple times every day  Duration: few seconds at a time  Aggravating factors: Induced by position change: laying down at night and Worse in the morning  Relieving factors: no known relieving factors; symptoms go away pretty quick   Progression of symptoms: unchanged  OCULOMOTOR EXAM:  Ocular Alignment: normal  Ocular ROM: No Limitations; pt reported blurriness when transitioning from the left to the right  Spontaneous Nystagmus: absent  Gaze-Induced Nystagmus: absent  Smooth Pursuits: intact    VESTIBULAR - OCULAR REFLEX:   Head-Impulse Test: HIT Right: positive HIT Left: negative  Functional testing: Functional Gait Assessment Summary 1. GAIT LEVEL SURFACE: Normal -- gait level surface (3) (3 points) 2. CHANGE IN GAIT SPEED: Normal -- change in gait speed (3) (3 points) 3. GAIT WITH HORIZONTAL HEAD TURNS: Moderate impairment -- gait with horizontal head turns  (1) (1 points) 4. GAIT WITH VERTICAL HEAD TURNS: Moderate impairment -- gait with vertical head turns (1) (1 points) 5. GAIT AND PIVOT TURN: Severe impairment -- gait and pivot turn (0) (0 points) 6. STEP OVER OBSTACLE: Normal -- step over obstacle (3) (3 points) 7. GAIT WITH NARROW BASE OF SUPPORT: Moderate impairment -- gait with narrow base of support (1) (1 points) 8. GAIT WITH EYES CLOSED: Moderate impairment -- gait with eyes closed (1) (1 points) 9. AMBULATING BACKWARDS: Moderate impairment -- ambulating backwards (1) (1 points) 10. STEPS: Normal -- up and down steps (3) (3 points) Functional Gait Assessment: 17/30=56.7 percent.  POSITIONAL TESTING: Right Dix-Hallpike: upbeating, right nystagmus Left Dix-Hallpike: no nystagmus                                                                                    TREATMENT DATE:  05/20/2024  FGA completed, Trenda Shona Grebe testing bilaterally, education on possible other causes of imbalance and follow up with neurology as symptoms abnormal to BPPV with involuntary head movements and tremors.  Neuromuscular Re-education: -Seated VOR, 1 bout, 10 reps each direction, horizontal and vertical -Seated VOR cancellization, 1 bout 10 reps, horizontal -Standing VOR, 1 bout, 10 reps each direction, horizontal and vertical, Pt demonstrates significant imbalance, requires CGA for regaining balance -Standing VOR cancellization, 1 set of 10 bouts, no imbalance until about rep 6  05/11/24 AROM of cervical spine Gaze stabilization up and down and side to side no issue Review of VOR exercise without issue Visual tracking and convergence noted increased involuntary neck movements; mild involuntary eye movement SLS unable either side Dix hallpike right side mild positive so performed Epley; no dizziness on sitting up. Tandem stance Updated HEP  05/04/24 EXERCISE LOG  Exercise Repetitions and  Resistance Comments  Vitals  BP: 110/69 HR: 81 bpm  SpO2: 96% Seated   Epley maneuver   X2  For R posterior canal  2 minute walk test  365 feet    VOR x 1   10 reps each  Added to HEP (patient declined handout); w/ arm and head horizontal movement  Patient education  See below     Blank cell = exercise not performed today   04/26/24: PT evaluation and patient education  PATIENT EDUCATION: Education details: symptoms, healing, and goals for physical therapy  Person educated: Patient Education method: Explanation Education comprehension: verbalized understanding  HOME EXERCISE PROGRAM: Access Code: [email or web address] URL: [email or web address] Date: 05/20/2024 Prepared by: Lang Ada  Exercises - standing single leg balance at the counter (try to not hold on)  - 2 x daily - 7 x weekly - 1 sets - 3 reps - tandem stance balance; try not to hold on  - 2 x daily - 7 x weekly - 1 sets - 3 reps - Seated Gaze Stabilization with Head Rotation  - 1 x daily - 7 x weekly - 3 sets - 10 reps - Seated VOR Cancellation  - 1 x daily - 7 x weekly - 3 sets - 10 reps - Seated Gaze Stabilization with Head Rotation  - 1 x daily - 7 x weekly - 3 sets - 10 reps  GOALS: Goals reviewed with patient? Yes  LONG TERM GOALS: Target date: 05/24/24  Patient will be independent with her HEP.  Baseline:  Goal status: INITIAL  2.  Patient will be transfer from sitting to supine without reproducing her symptoms to be able to lay down at night to sleep.  Baseline:  Goal status: INITIAL  3.  Patient will improve her ABC scale by at least 15% for improved perceived function with her daily activities.  Baseline:  Goal status: INITIAL  ASSESSMENT:  CLINICAL IMPRESSION: Patient continues to demonstrate abnormal imbalance issues. Pt demonstrates abnormal involuntary movements of head following VOR, and increased fall risk on FGA this date. Patient also demonstrates negative for nystagmus bilateral Trenda Shona Grebe testing this date, although pt does report an increased crazy feeling in head especially on the R side. Patient able to progress dynamic balance and VOR activation exercises today with standing balance/VOR activities, good performance with verbal cueing and CGA required for safety. Patient would continue to benefit from skilled physical therapy for education on contributors to dizziness/imbalance, increased LE strength, and improved balance for improved quality of life, improved independence with community ambulation and continued progress towards therapy goals.        Eval: Patient is a 58 y.o. female who was seen today for physical therapy evaluation and treatment for dizziness and unsteadiness on her feet. She was positive for right posterior canal BPPV as evidenced by her positive Dix-Halpike as this also reproduce her familiar symptoms. She was educated on how this was treated in physical therapy and the plan for her next appointment. She reported understanding. Recommend that she continue with skilled physical therapy to address her impairments to maximize her safety and functional mobility.     OBJECTIVE IMPAIRMENTS: decreased activity tolerance, decreased balance, difficulty walking, and dizziness.   ACTIVITY LIMITATIONS: bed mobility and locomotion level  PARTICIPATION LIMITATIONS: community activity  PERSONAL FACTORS: Past/current experiences, Time since onset of injury/illness/exacerbation, and 3+ comorbidities: asthma and memory deficits are also affecting patient's functional outcome.   REHAB POTENTIAL:  Good  CLINICAL DECISION MAKING: Stable/uncomplicated  EVALUATION COMPLEXITY: Low   PLAN:  PT FREQUENCY: 1-2x/week  PT DURATION: 4 weeks  PLANNED INTERVENTIONS: 97164- PT Re-evaluation, 97750- Physical Performance Testing, 97110-Therapeutic exercises, 97530- Therapeutic activity, 97112- Neuromuscular re-education, 97535- Self Care, [id number]- Manual therapy, [phone]- Gait  training, [phone]- Canalith repositioning, Patient/Family education, Balance training, and Vestibular training  PLAN FOR NEXT SESSION: Epley for R posterior canal (negative 12/12), gait training, and balance interventions  Lang Ada, PT, DPT [facility] Office: [phone] 11:36 AM, 05/20/2024

## 2024-05-25 ENCOUNTER — Ambulatory Visit (HOSPITAL_COMMUNITY)

## 2024-05-27 ENCOUNTER — Telehealth (INDEPENDENT_AMBULATORY_CARE_PROVIDER_SITE_OTHER): Payer: Self-pay | Admitting: Otolaryngology

## 2024-05-27 ENCOUNTER — Ambulatory Visit (HOSPITAL_COMMUNITY)

## 2024-05-27 NOTE — Telephone Encounter (Signed)
 I spoke with this patient as she has a pending referral for a hearing test.  She had one on the day she came to see you, and then there is a referral for an audio.  I do not know if this was intended for a future follow up audio or it was meant to get attached to her audio she had the same day as your visit? Thank you!

## 2024-05-31 ENCOUNTER — Ambulatory Visit (HOSPITAL_COMMUNITY)

## 2024-05-31 DIAGNOSIS — R42 Dizziness and giddiness: Secondary | ICD-10-CM

## 2024-05-31 DIAGNOSIS — R2681 Unsteadiness on feet: Secondary | ICD-10-CM

## 2024-05-31 NOTE — Therapy (Signed)
 " OUTPATIENT PHYSICAL THERAPY VESTIBULAR TREATMENT/DISCHARGE PHYSICAL THERAPY DISCHARGE SUMMARY  Visits from Start of Care: 5  Current functional level related to goals / functional outcomes: See below   Remaining deficits: See below   Education / Equipment: HEP   Patient agrees to discharge. Patient goals were partially met. Patient is being discharged due to maximized rehab potential.       Patient Name: Nancy [name] MRN: [id number] DOB:[birth year], 58 y.o., female Today's Date: 05/31/2024  END OF SESSION:  PT End of Session - 05/31/24 1114     Visit Number 5    Number of Visits 8    Date for Recertification  07/08/24    Authorization Type [employer]    Authorization Time Period no authorization required    PT Start Time 1115    PT Stop Time 1155    PT Time Calculation (min) 40 min    Activity Tolerance Patient tolerated treatment well    Behavior During Therapy WFL for tasks assessed/performed            Past Medical History:  Diagnosis Date   Allergic rhinitis    Asthma    Benign familial tremor    Bipolar 1 disorder (HCC)    Chronic neck and back pain    Degenerative disk disease    Depression    DJD (degenerative joint disease), lumbar    Dyslipidemia    Fibromyalgia    GERD (gastroesophageal reflux disease)    Hiatal hernia    History of kidney stones    Irritable bowel syndrome (IBS)    Memory disorder 07/18/2014   Memory loss    Migraine headache    Nephrolithiasis    Pneumonia 1986   Serotonin syndrome    Tremor    hands,arms   Past Surgical History:  Procedure Laterality Date   APPENDECTOMY     back fusion     BACK SURGERY     BIOPSY  07/07/2023   Procedure: BIOPSY;  Surgeon: Cindie Carlin POUR, DO;  Location: [facility];  Service: Endoscopy;;   BRONCHOSCOPY     S. aureus from BAL   CARDIAC CATHETERIZATION  11/27/2006   minimal CAD   CHOLECYSTECTOMY     COLONOSCOPY WITH PROPOFOL  N/A 12/12/2020   Procedure:  COLONOSCOPY WITH PROPOFOL ;  Surgeon: Golda Claudis PENNER, MD;  Location: [facility];  Service: Endoscopy;  Laterality: N/A;  820   ESOPHAGEAL DILATION  08/06/2004   ESOPHAGOGASTRODUODENOSCOPY (EGD) WITH PROPOFOL  N/A 07/07/2023   Procedure: ESOPHAGOGASTRODUODENOSCOPY (EGD) WITH PROPOFOL ;  Surgeon: Cindie Carlin POUR, DO;  Location: [facility];  Service: Endoscopy;  Laterality: N/A;  200pm, asa 2   KNEE ARTHROSCOPY WITH MEDIAL MENISECTOMY Left 08/09/2021   Procedure: KNEE ARTHROSCOPY WITH PARTIAL MEDIAL MENISCECTOMY;  Surgeon: Margrette Taft BRAVO, MD;  Location: [facility];  Service: Orthopedics;  Laterality: Left;   LAPAROSCOPIC CHOLECYSTECTOMY     LASER ABLATION OF THE CERVIX     MICRODISSECTION L5-S1  08/13/2000   neck fusion     TONSILLECTOMY     Patient Active Problem List   Diagnosis Date Noted   Headache, migraine 10/31/2021   Acute medial meniscus tear of left knee    GERD (gastroesophageal reflux disease) 06/13/2021   Allergic rhinitis 12/30/2019   Degenerative lumbar disc 11/23/2018   Preoperative clearance 11/09/2018   Memory disorder 07/18/2014   Severe persistent asthma with acute bronchitis and acute exacerbation (HCC) 03/04/2013   Exercise-induced asthma 07/30/2012   Severe persistent asthma  in adult without complication (HCC) 03/12/2011    PCP: Rosamond Leta NOVAK, MD  REFERRING PROVIDER: Tobie Eldora NOVAK, MD   REFERRING DIAG: Imbalance   THERAPY DIAG:  Dizziness and giddiness  Unsteadiness on feet  ONSET DATE: 4-5 months ago  Rationale for Evaluation and Treatment: Rehabilitation  SUBJECTIVE:   SUBJECTIVE STATEMENT: Gaze stabilization exercises seem to make her balance worse; reports no spinning.  Still challenged with balance exercises. She reports no improvement at all with her balance. She states she runs into door facings and walls.  She has improved with her bed mobility; no issues there.     Eval: Patient reports that she was told that she had vertigo about  4-5 months ago, but she felt like the room was shaking and not spinning. She had tried medication, but this did not help any. Valium  was the only thing that really helped. She has noticed that her head will want to spin when she lays down at night. She will also feel like a drunk person when she wakes up in the morning.  Pt accompanied by: self  PERTINENT HISTORY: asthma and memory deficits  PAIN:  Are you having pain? No  PRECAUTIONS: None  RED FLAGS: None   WEIGHT BEARING RESTRICTIONS: No  FALLS: Has patient fallen in last 6 months? No  LIVING ENVIRONMENT: Lives with: lives with their spouse Lives in: House/apartment Stairs: Yes: External: 3 steps; on left going up; reciprocal pattern  Has following equipment at home: None  PLOF: Independent  PATIENT GOALS: resolve her symptoms  OBJECTIVE:  Note: Objective measures were completed at Evaluation unless otherwise noted.  COGNITION: Overall cognitive status: Within functional limits for tasks assessed   SENSATION: Patient reports no numbness or tingling  Cervical ROM:    Active A/PROM (deg) eval  Flexion WFL  Extension WFL   Right lateral flexion 75% limited   Left lateral flexion 75% limited  Right rotation WFL   Left rotation WFL   (Blank rows = not tested)  LOWER EXTREMITY MMT:   MMT Right eval Left eval  Hip flexion 4-/5 4-/5  Hip abduction    Hip adduction    Hip internal rotation    Hip external rotation    Knee flexion 4+/5 4+/5  Knee extension 4+/5 4+/5  Ankle dorsiflexion 4+/5 4+/5  Ankle plantarflexion    Ankle inversion    Ankle eversion    (Blank rows = not tested)  BED MOBILITY:  Sit to supine Complete Independence Supine to sit Complete Independence  TRANSFERS: Assistive device utilized: None  Sit to stand: Complete Independence Stand to sit: Complete Independence  PATIENT SURVEYS:  ABC scale: The Activities-Specific Balance Confidence (ABC) Scale 0% 10 20 30  40 50 60 70 80 90  100% No confidence<->completely confident  How confident are you that you will not lose your balance or become unsteady when you . . .   Date tested 04/26/24  Walk around the house 100%  2. Walk up or down stairs 100%  3. Bend over and pick up a slipper from in front of a closet floor 100%  4. Reach for a small can off a shelf at eye level 100%  5. Stand on tip toes and reach for something above your head 50%  6. Stand on a chair and reach for something 0%  7. Sweep the floor 100%  8. Walk outside the house to a car parked in the driveway 100%  9. Get into or out of a  car 100%  10. Walk across a parking lot to the mall 100%  11. Walk up or down a ramp 100%  12. Walk in a crowded mall where people rapidly walk past you 100%  13. Are bumped into by people as you walk through the mall 100%  14. Step onto or off of an escalator while you are holding onto the railing 25%  15. Step onto or off an escalator while holding onto parcels such that you cannot hold onto the railing 0%  16. Walk outside on icy sidewalks 25%  Total: #/16  75%     VESTIBULAR ASSESSMENT:  GENERAL OBSERVATION: no LOB with ambulation   SYMPTOM BEHAVIOR:  Subjective history: I've been bouncing off furniture for a long time now (years).   Non-Vestibular symptoms: headaches and migraine symptoms  Type of dizziness: room moves side to side  Frequency: multiple times every day  Duration: few seconds at a time  Aggravating factors: Induced by position change: laying down at night and Worse in the morning  Relieving factors: no known relieving factors; symptoms go away pretty quick   Progression of symptoms: unchanged  OCULOMOTOR EXAM:  Ocular Alignment: normal  Ocular ROM: No Limitations; pt reported blurriness when transitioning from the left to the right  Spontaneous Nystagmus: absent  Gaze-Induced Nystagmus: absent  Smooth Pursuits: intact    VESTIBULAR - OCULAR REFLEX:   Head-Impulse Test: HIT Right:  positive HIT Left: negative  Functional testing: Functional Gait Assessment Summary 1. GAIT LEVEL SURFACE: Normal -- gait level surface (3) (3 points) 2. CHANGE IN GAIT SPEED: Normal -- change in gait speed (3) (3 points) 3. GAIT WITH HORIZONTAL HEAD TURNS: Moderate impairment -- gait with horizontal head turns (1) (1 points) 4. GAIT WITH VERTICAL HEAD TURNS: Moderate impairment -- gait with vertical head turns (1) (1 points) 5. GAIT AND PIVOT TURN: Severe impairment -- gait and pivot turn (0) (0 points) 6. STEP OVER OBSTACLE: Normal -- step over obstacle (3) (3 points) 7. GAIT WITH NARROW BASE OF SUPPORT: Moderate impairment -- gait with narrow base of support (1) (1 points) 8. GAIT WITH EYES CLOSED: Moderate impairment -- gait with eyes closed (1) (1 points) 9. AMBULATING BACKWARDS: Moderate impairment -- ambulating backwards (1) (1 points) 10. STEPS: Normal -- up and down steps (3) (3 points) Functional Gait Assessment: 17/30=56.7 percent.  POSITIONAL TESTING: Right Dix-Hallpike: upbeating, right nystagmus Left Dix-Hallpike: no nystagmus                                                                                    TREATMENT DATE:  05/31/24 AROM of cervical spine wfl (history of cervical fusion); no pain  ABC scale 91.3% Left sidelying x 30 sec no dizziness Right sidelying x 30 sec no dizziness Review of goals and HEP   05/20/2024  FGA completed, [employer] testing bilaterally, education on possible other causes of imbalance and follow up with neurology as symptoms abnormal to BPPV with involuntary head movements and tremors.  Neuromuscular Re-education: -Seated VOR, 1 bout, 10 reps each direction, horizontal and vertical -Seated VOR cancellization, 1 bout 10 reps, horizontal -Standing VOR, 1 bout, 10 reps each  direction, horizontal and vertical, Pt demonstrates significant imbalance, requires CGA for regaining balance -Standing VOR cancellization, 1  set of 10 bouts, no imbalance until about rep 6  05/11/24 AROM of cervical spine Gaze stabilization up and down and side to side no issue Review of VOR exercise without issue Visual tracking and convergence noted increased involuntary neck movements; mild involuntary eye movement SLS unable either side Dix hallpike right side mild positive so performed Epley; no dizziness on sitting up. Tandem stance Updated HEP                                     05/04/24 EXERCISE LOG  Exercise Repetitions and Resistance Comments  Vitals  BP: 110/69 HR: 81 bpm  SpO2: 96% Seated   Epley maneuver   X2  For R posterior canal  2 minute walk test  365 feet    VOR x 1   10 reps each  Added to HEP (patient declined handout); w/ arm and head horizontal movement  Patient education  See below     Blank cell = exercise not performed today   04/26/24: PT evaluation and patient education  PATIENT EDUCATION: Education details: symptoms, healing, and goals for physical therapy  Person educated: Patient Education method: Explanation Education comprehension: verbalized understanding  HOME EXERCISE PROGRAM: Access Code: [email or web address] URL: [email or web address] Date: 05/20/2024 Prepared by: Lang Ada  Exercises - standing single leg balance at the counter (try to not hold on)  - 2 x daily - 7 x weekly - 1 sets - 3 reps - tandem stance balance; try not to hold on  - 2 x daily - 7 x weekly - 1 sets - 3 reps - Seated Gaze Stabilization with Head Rotation  - 1 x daily - 7 x weekly - 3 sets - 10 reps - Seated VOR Cancellation  - 1 x daily - 7 x weekly - 3 sets - 10 reps - Seated Gaze Stabilization with Head Rotation  - 1 x daily - 7 x weekly - 3 sets - 10 reps  GOALS: Goals reviewed with patient? Yes  LONG TERM GOALS: Target date: 05/24/24  Patient will be independent with her HEP.  Baseline:  Goal status: INITIAL  2.  Patient will be transfer from sitting to supine without reproducing her  symptoms to be able to lay down at night to sleep.  Baseline:  Goal status: MET  3.  Patient will improve her ABC scale by at least 15% for improved perceived function with her daily activities.  Baseline:75%;  91% 05/31/24 Goal status: MET  ASSESSMENT:  CLINICAL IMPRESSION: Patient reports bed mobility has improved since initial Epley maneuver; having trouble with feeling off balance still.  Instructed her to d/c gaze stabilization exercises as this seems to make her worse.  Encouraged patient to continue with balance exercises.    Eval: Patient is a 58 y.o. female who was seen today for physical therapy evaluation and treatment for dizziness and unsteadiness on her feet. She was positive for right posterior canal BPPV as evidenced by her positive Dix-Halpike as this also reproduce her familiar symptoms. She was educated on how this was treated in physical therapy and the plan for her next appointment. She reported understanding. Recommend that she continue with skilled physical therapy to address her impairments to maximize her safety and functional mobility.     OBJECTIVE IMPAIRMENTS: decreased activity tolerance, decreased balance,  difficulty walking, and dizziness.   ACTIVITY LIMITATIONS: bed mobility and locomotion level  PARTICIPATION LIMITATIONS: community activity  PERSONAL FACTORS: Past/current experiences, Time since onset of injury/illness/exacerbation, and 3+ comorbidities: asthma and memory deficits are also affecting patient's functional outcome.   REHAB POTENTIAL: Good  CLINICAL DECISION MAKING: Stable/uncomplicated  EVALUATION COMPLEXITY: Low   PLAN:  PT FREQUENCY: 1-2x/week  PT DURATION: 4 weeks  PLANNED INTERVENTIONS: [id number]- PT Re-evaluation, 97750- Physical Performance Testing, 97110-Therapeutic exercises, 97530- Therapeutic activity, 97112- Neuromuscular re-education, 97535- Self Care, [id number]- Manual therapy, [phone]- Gait training, [phone]- Canalith  repositioning, Patient/Family education, Balance training, and Vestibular training  PLAN FOR NEXT SESSION:  discharge due to reaching max rehab potential; no significant changes over last few weeks 11:40 AM, 05/31/2024 Eveline Sauve Small Jeryl Wilbourn MPT  physical therapy [place] [phone] Ph:[phone]  "

## 2024-06-06 ENCOUNTER — Ambulatory Visit (HOSPITAL_COMMUNITY)

## 2024-06-10 ENCOUNTER — Ambulatory Visit (HOSPITAL_COMMUNITY)

## 2024-07-07 ENCOUNTER — Other Ambulatory Visit: Payer: Self-pay | Admitting: *Deleted

## 2024-07-07 MED ORDER — PRIMIDONE 50 MG PO TABS: ORAL_TABLET | ORAL | 2 refills | Status: AC

## 2024-07-07 NOTE — Telephone Encounter (Signed)
 Last seen on 03/09/24 per note   We will continue primidone  but increase dose to 50mg  in the mornings and 100mg  at bedtime.   Follow up scheduled on 03/20/25

## 2024-07-20 ENCOUNTER — Ambulatory Visit (INDEPENDENT_AMBULATORY_CARE_PROVIDER_SITE_OTHER): Admitting: Otolaryngology

## 2025-03-20 ENCOUNTER — Ambulatory Visit: Admitting: Family Medicine
